# Patient Record
Sex: Male | Born: 1950 | Race: Black or African American | Hispanic: No | Marital: Married | Smoking: Current every day smoker
Health system: Southern US, Community
[De-identification: ages and names within clinical notes are randomized; demographics above are authoritative.]

## PROBLEM LIST (undated history)

## (undated) DIAGNOSIS — N402 Nodular prostate without lower urinary tract symptoms: Secondary | ICD-10-CM

## (undated) DIAGNOSIS — K635 Polyp of colon: Secondary | ICD-10-CM

## (undated) DIAGNOSIS — M1990 Unspecified osteoarthritis, unspecified site: Secondary | ICD-10-CM

## (undated) DIAGNOSIS — I10 Essential (primary) hypertension: Secondary | ICD-10-CM

## (undated) DIAGNOSIS — C801 Malignant (primary) neoplasm, unspecified: Secondary | ICD-10-CM

## (undated) DIAGNOSIS — Z8619 Personal history of other infectious and parasitic diseases: Secondary | ICD-10-CM

## (undated) DIAGNOSIS — G5600 Carpal tunnel syndrome, unspecified upper limb: Secondary | ICD-10-CM

## (undated) HISTORY — DX: Personal history of other infectious and parasitic diseases: Z86.19

## (undated) HISTORY — DX: Nodular prostate without lower urinary tract symptoms: N40.2

## (undated) HISTORY — DX: Carpal tunnel syndrome, unspecified upper limb: G56.00

## (undated) HISTORY — DX: Unspecified osteoarthritis, unspecified site: M19.90

## (undated) HISTORY — DX: Polyp of colon: K63.5

---

## 1999-11-30 HISTORY — PX: TOTAL HIP ARTHROPLASTY: SHX124

## 2000-10-31 HISTORY — PX: JOINT REPLACEMENT: SHX530

## 2004-11-29 DIAGNOSIS — K635 Polyp of colon: Secondary | ICD-10-CM

## 2004-11-29 HISTORY — DX: Polyp of colon: K63.5

## 2005-02-22 ENCOUNTER — Ambulatory Visit: Payer: Self-pay | Admitting: Unknown Physician Specialty

## 2005-02-22 LAB — HM COLONOSCOPY

## 2008-02-17 ENCOUNTER — Ambulatory Visit: Payer: Self-pay | Admitting: Neurology

## 2011-09-23 HISTORY — PX: PROSTATE SURGERY: SHX751

## 2012-09-29 ENCOUNTER — Ambulatory Visit: Payer: Self-pay | Admitting: Specialist

## 2013-07-12 ENCOUNTER — Encounter (HOSPITAL_COMMUNITY): Payer: Self-pay | Admitting: Pharmacy Technician

## 2013-07-13 ENCOUNTER — Other Ambulatory Visit (HOSPITAL_COMMUNITY): Payer: Self-pay | Admitting: Orthopedic Surgery

## 2013-07-13 NOTE — Patient Instructions (Addendum)
20 Francisco Charles  07/13/2013   Your procedure is scheduled on: 07-23-2013  Report to [facility] at 530 AM.  Call this number if you have problems the morning of surgery [phone]   Remember:   Do not eat food or drink liquids :After Midnight.                 SEE [facility] PREPARING FOR SURGERY SHEET   Do not wear jewelry, make-up or nail polish.  Do not wear lotions, powders, or perfumes. You may wear deodorant.   Men may shave face and neck.  Do not bring valuables to the hospital. [facility] IS NOT RESPONSIBLE FOR VALUEABLES.  Contacts, dentures or bridgework may not be worn into surgery.  Leave suitcase in the car. After surgery it may be brought to your room.  For patients admitted to the hospital, checkout time is 11:00 AM the day of discharge.   Patients discharged the day of surgery will not be allowed to drive home.  Name and phone number of your driver:  Special Instructions: N/A   Please read over the following fact sheets that you were given: MRSA information, blood fact sheet, incentive spirometer fact sheet  Call [name] RN pre op nurse if needed [phone]    FAILURE TO FOLLOW THESE INSTRUCTIONS MAY RESULT IN THE CANCELLATION OF YOUR SURGERY.  PATIENT SIGNATURE___________________________________________  NURSE SIGNATURE_____________________________________________

## 2013-07-17 ENCOUNTER — Ambulatory Visit (HOSPITAL_COMMUNITY)
Admission: RE | Admit: 2013-07-17 | Discharge: 2013-07-17 | Disposition: A | Discharge status: Home / Self Care | Payer: No Typology Code available for payment source | Source: Ambulatory Visit | Attending: Orthopedic Surgery | Admitting: Orthopedic Surgery

## 2013-07-17 ENCOUNTER — Encounter (HOSPITAL_COMMUNITY): Payer: Self-pay

## 2013-07-17 ENCOUNTER — Encounter (HOSPITAL_COMMUNITY)
Admission: RE | Admit: 2013-07-17 | Discharge: 2013-07-17 | Disposition: A | Discharge status: Home / Self Care | Payer: No Typology Code available for payment source | Source: Ambulatory Visit | Attending: Orthopedic Surgery | Admitting: Orthopedic Surgery

## 2013-07-17 DIAGNOSIS — Z01818 Encounter for other preprocedural examination: Secondary | ICD-10-CM | POA: Insufficient documentation

## 2013-07-17 DIAGNOSIS — I10 Essential (primary) hypertension: Secondary | ICD-10-CM | POA: Insufficient documentation

## 2013-07-17 DIAGNOSIS — M1610 Unilateral primary osteoarthritis, unspecified hip: Secondary | ICD-10-CM | POA: Insufficient documentation

## 2013-07-17 DIAGNOSIS — Z01812 Encounter for preprocedural laboratory examination: Secondary | ICD-10-CM | POA: Insufficient documentation

## 2013-07-17 DIAGNOSIS — M169 Osteoarthritis of hip, unspecified: Secondary | ICD-10-CM | POA: Insufficient documentation

## 2013-07-17 HISTORY — DX: Essential (primary) hypertension: I10

## 2013-07-17 LAB — CBC
HCT: 44.8 % (ref 39.0–52.0)
MCH: 31.7 pg (ref 26.0–34.0)
MCV: 89.8 fL (ref 78.0–100.0)
Platelets: 290 10*3/uL (ref 150–400)
RDW: 14.1 % (ref 11.5–15.5)

## 2013-07-17 LAB — BASIC METABOLIC PANEL
BUN: 12 mg/dL (ref 6–23)
CO2: 29 mEq/L (ref 19–32)
Calcium: 9.7 mg/dL (ref 8.4–10.5)
Chloride: 95 mEq/L — ABNORMAL LOW (ref 96–112)
Creatinine, Ser: 0.92 mg/dL (ref 0.50–1.35)
Glucose, Bld: 88 mg/dL (ref 70–99)

## 2013-07-17 LAB — URINE MICROSCOPIC-ADD ON

## 2013-07-17 LAB — URINALYSIS, ROUTINE W REFLEX MICROSCOPIC
Glucose, UA: NEGATIVE mg/dL
Ketones, ur: NEGATIVE mg/dL
Leukocytes, UA: NEGATIVE
Protein, ur: NEGATIVE mg/dL
pH: 6 (ref 5.0–8.0)

## 2013-07-17 LAB — SURGICAL PCR SCREEN: MRSA, PCR: NEGATIVE

## 2013-07-17 NOTE — Progress Notes (Signed)
06-11-13 medical clearance note Dr. Sherrie Mustache & EKG on chart

## 2013-07-19 NOTE — H&P (Signed)
TOTAL HIP REVISION ADMISSION H&P  Patient is admitted for left revision total hip arthroplasty, acetabular component.  Subjective:  Chief Complaint: Left hip pain s/p left total hip arthroplasty  HPI: Francisco Charles, 62 y.o. male, has a history of pain and functional disability in the left hip due to pain and patient has failed non-surgical conservative treatments for greater than 12 weeks to include NSAID's and/or analgesics and activity modification. The indications for the revision total hip arthroplasty are loosening of one or more components.  Onset of symptoms was gradual starting 2 years ago with gradually worsening course since that time.  Prior procedures on the left hip include arthroplasty.  Patient currently rates pain in the left hip at 7 out of 10 with activity.  There is worsening of pain with activity and weight bearing, trendelenberg gait and pain that interfers with activities of daily living. Patient has evidence of prosthetic loosening by imaging studies.  This condition presents safety issues increasing the risk of falls.   There is no current active signs of infection.  Risks, benefits and expectations were discussed with the patient. Patient understand the risks, benefits and expectations and wishes to proceed with surgery.   D/C Plans:   Home with HHPT  Post-op Meds:     Rx given for ASA, Robaxin, Iron, Colace and MiraLax  Tranexamic Acid:   To be given  Decadron:    To be given  FYI:    ASA post-op    Past Medical History  Diagnosis Date  . Hypertension     Past Surgical History  Procedure Laterality Date  . Joint replacement Left 10-31-2000    No Known Allergies   History  Substance Use Topics  . Smoking status: Current Every Day Smoker -- 0.50 packs/day for 30 years    Types: Cigarettes  . Smokeless tobacco: Never Used  . Alcohol Use: 1.2 oz/week    2 Cans of beer per week      Review of Systems  Constitutional: Negative.   HENT: Negative.    Eyes: Negative.   Respiratory: Negative.   Cardiovascular: Negative.   Gastrointestinal: Negative.   Genitourinary: Negative.   Musculoskeletal: Positive for joint pain.  Skin: Negative.   Neurological: Negative.   Endo/Heme/Allergies: Negative.   Psychiatric/Behavioral: Negative.     Objective:  Physical Exam  Constitutional: He is oriented to person, place, and time. He appears well-developed and well-nourished.  HENT:  Head: Normocephalic and atraumatic.  Mouth/Throat: Oropharynx is clear and moist. He has dentures.  Eyes: Pupils are equal, round, and reactive to light.  Neck: Neck supple. No JVD present. No tracheal deviation present. No thyromegaly present.  Cardiovascular: Normal rate, regular rhythm, normal heart sounds and intact distal pulses.   Respiratory: Effort normal and breath sounds normal. No stridor. No respiratory distress. He has no wheezes.  GI: Soft. There is no tenderness. There is no guarding.  Musculoskeletal:       Left hip: He exhibits decreased range of motion, decreased strength, tenderness and bony tenderness. He exhibits no swelling, no deformity and no laceration.  Lymphadenopathy:    He has no cervical adenopathy.  Neurological: He is alert and oriented to person, place, and time.  Skin: Skin is warm and dry.  Psychiatric: He has a normal mood and affect.     Imaging Review:  Plain radiographs demonstrate previous left total hip arthroplasty of the left hip. There is evidence of loosening of the acetabular cup.The bone quality appears to  be good for age and reported activity level.  Assessment/Plan:  End stage arthritis, left hip(s) with failed previous arthroplasty.  The patient history, physical examination, clinical judgement of the provider and imaging studies are consistent with end stage degenerative joint disease of the left hip(s), previous total hip arthroplasty. Revision total hip arthroplasty is deemed medically necessary. The  treatment options including medical management, injection therapy, arthroscopy and arthroplasty were discussed at length. The risks and benefits of total hip arthroplasty were presented and reviewed. The risks due to aseptic loosening, infection, stiffness, dislocation/subluxation,  thromboembolic complications and other imponderables were discussed.  The patient acknowledged the explanation, agreed to proceed with the plan and consent was signed. Patient is being admitted for inpatient treatment for surgery, pain control, PT, OT, prophylactic antibiotics, VTE prophylaxis, progressive ambulation and ADL's and discharge planning. The patient is planning to be discharged home with home health services.    Anastasio Auerbach Ranson Belluomini   PAC  07/19/2013, 12:28 PM

## 2013-07-23 ENCOUNTER — Inpatient Hospital Stay (HOSPITAL_COMMUNITY): Payer: No Typology Code available for payment source

## 2013-07-23 ENCOUNTER — Encounter (HOSPITAL_COMMUNITY): Payer: Self-pay | Admitting: *Deleted

## 2013-07-23 ENCOUNTER — Inpatient Hospital Stay (HOSPITAL_COMMUNITY)
Admission: RE | Admit: 2013-07-23 | Discharge: 2013-07-24 | DRG: 467 | Disposition: A | Discharge status: Home Health | Payer: No Typology Code available for payment source | Source: Ambulatory Visit | Attending: Orthopedic Surgery | Admitting: Orthopedic Surgery

## 2013-07-23 ENCOUNTER — Inpatient Hospital Stay (HOSPITAL_COMMUNITY): Payer: No Typology Code available for payment source | Admitting: Anesthesiology

## 2013-07-23 ENCOUNTER — Encounter (HOSPITAL_COMMUNITY): Payer: Self-pay | Admitting: Anesthesiology

## 2013-07-23 ENCOUNTER — Encounter (HOSPITAL_COMMUNITY)
Admission: RE | Disposition: A | Discharge status: Home Health | Payer: Self-pay | Source: Ambulatory Visit | Attending: Orthopedic Surgery

## 2013-07-23 DIAGNOSIS — Z96649 Presence of unspecified artificial hip joint: Secondary | ICD-10-CM

## 2013-07-23 DIAGNOSIS — M169 Osteoarthritis of hip, unspecified: Secondary | ICD-10-CM | POA: Diagnosis present

## 2013-07-23 DIAGNOSIS — M899 Disorder of bone, unspecified: Secondary | ICD-10-CM | POA: Diagnosis present

## 2013-07-23 DIAGNOSIS — E663 Overweight: Secondary | ICD-10-CM

## 2013-07-23 DIAGNOSIS — T84039A Mechanical loosening of unspecified internal prosthetic joint, initial encounter: Principal | ICD-10-CM | POA: Diagnosis present

## 2013-07-23 DIAGNOSIS — I10 Essential (primary) hypertension: Secondary | ICD-10-CM | POA: Diagnosis present

## 2013-07-23 DIAGNOSIS — F17200 Nicotine dependence, unspecified, uncomplicated: Secondary | ICD-10-CM | POA: Diagnosis present

## 2013-07-23 DIAGNOSIS — Y831 Surgical operation with implant of artificial internal device as the cause of abnormal reaction of the patient, or of later complication, without mention of misadventure at the time of the procedure: Secondary | ICD-10-CM | POA: Diagnosis present

## 2013-07-23 DIAGNOSIS — M869 Osteomyelitis, unspecified: Secondary | ICD-10-CM | POA: Diagnosis present

## 2013-07-23 DIAGNOSIS — D62 Acute posthemorrhagic anemia: Secondary | ICD-10-CM | POA: Diagnosis not present

## 2013-07-23 DIAGNOSIS — Z6827 Body mass index (BMI) 27.0-27.9, adult: Secondary | ICD-10-CM

## 2013-07-23 DIAGNOSIS — M1610 Unilateral primary osteoarthritis, unspecified hip: Secondary | ICD-10-CM | POA: Diagnosis present

## 2013-07-23 DIAGNOSIS — D500 Iron deficiency anemia secondary to blood loss (chronic): Secondary | ICD-10-CM | POA: Diagnosis not present

## 2013-07-23 HISTORY — PX: TOTAL HIP REVISION: SHX763

## 2013-07-23 LAB — TYPE AND SCREEN
ABO/RH(D): O POS
Antibody Screen: NEGATIVE

## 2013-07-23 SURGERY — TOTAL HIP REVISION
Anesthesia: Spinal | Site: Hip | Laterality: Left | Wound class: Clean

## 2013-07-23 MED ORDER — 0.9 % SODIUM CHLORIDE (POUR BTL) OPTIME
TOPICAL | Status: DC | PRN
  Administered 2013-07-23 (×2): 1000 mL

## 2013-07-23 MED ORDER — OXYCODONE HCL 5 MG/5ML PO SOLN
5.0000 mg | Freq: Once | ORAL | Status: DC | PRN
  Filled 2013-07-23: qty 5

## 2013-07-23 MED ORDER — CELECOXIB 200 MG PO CAPS
200.0000 mg | ORAL_CAPSULE | Freq: Two times a day (BID) | ORAL | Status: DC
  Administered 2013-07-23 – 2013-07-24 (×2): 200 mg via ORAL
  Filled 2013-07-23 (×3): qty 1

## 2013-07-23 MED ORDER — HYDROCODONE-ACETAMINOPHEN 7.5-325 MG PO TABS
1.0000 | ORAL_TABLET | ORAL | Status: DC
  Administered 2013-07-23 (×2): 2 via ORAL
  Administered 2013-07-23 – 2013-07-24 (×5): 1 via ORAL
  Filled 2013-07-23 (×2): qty 2
  Filled 2013-07-23: qty 1
  Filled 2013-07-23: qty 2
  Filled 2013-07-23 (×3): qty 1

## 2013-07-23 MED ORDER — SODIUM CHLORIDE 0.9 % IV SOLN
100.0000 mL/h | INTRAVENOUS | Status: DC
  Administered 2013-07-23 – 2013-07-24 (×2): 100 mL/h via INTRAVENOUS
  Filled 2013-07-23 (×8): qty 1000

## 2013-07-23 MED ORDER — CISATRACURIUM BESYLATE (PF) 10 MG/5ML IV SOLN
INTRAVENOUS | Status: DC | PRN
  Administered 2013-07-23: 2 mg via INTRAVENOUS
  Administered 2013-07-23: 4 mg via INTRAVENOUS
  Administered 2013-07-23: 8 mg via INTRAVENOUS
  Administered 2013-07-23: 2 mg via INTRAVENOUS

## 2013-07-23 MED ORDER — CEFAZOLIN SODIUM-DEXTROSE 2-3 GM-% IV SOLR
2.0000 g | INTRAVENOUS | Status: AC
  Administered 2013-07-23: 2 g via INTRAVENOUS

## 2013-07-23 MED ORDER — ASPIRIN EC 325 MG PO TBEC
325.0000 mg | DELAYED_RELEASE_TABLET | Freq: Two times a day (BID) | ORAL | Status: DC
  Administered 2013-07-24: 325 mg via ORAL
  Filled 2013-07-23 (×3): qty 1

## 2013-07-23 MED ORDER — FENTANYL CITRATE 0.05 MG/ML IJ SOLN
INTRAMUSCULAR | Status: DC | PRN
  Administered 2013-07-23: 100 ug via INTRAVENOUS
  Administered 2013-07-23 (×3): 50 ug via INTRAVENOUS

## 2013-07-23 MED ORDER — HYDROMORPHONE HCL PF 1 MG/ML IJ SOLN
INTRAMUSCULAR | Status: DC | PRN
  Administered 2013-07-23 (×2): 0.5 mg via INTRAVENOUS
  Administered 2013-07-23: 1 mg via INTRAVENOUS

## 2013-07-23 MED ORDER — GLYCOPYRROLATE 0.2 MG/ML IJ SOLN
INTRAMUSCULAR | Status: DC | PRN
  Administered 2013-07-23: 0.6 mg via INTRAVENOUS

## 2013-07-23 MED ORDER — POLYETHYLENE GLYCOL 3350 17 G PO PACK
17.0000 g | PACK | Freq: Two times a day (BID) | ORAL | Status: DC
  Administered 2013-07-23 – 2013-07-24 (×2): 17 g via ORAL

## 2013-07-23 MED ORDER — FLEET ENEMA 7-19 GM/118ML RE ENEM: 1.0000 | ENEMA | Freq: Once | RECTAL | Status: AC | PRN

## 2013-07-23 MED ORDER — METOCLOPRAMIDE HCL 10 MG PO TABS: 5.0000 mg | ORAL_TABLET | Freq: Three times a day (TID) | ORAL | Status: DC | PRN

## 2013-07-23 MED ORDER — MEPERIDINE HCL 50 MG/ML IJ SOLN: 6.2500 mg | INTRAMUSCULAR | Status: DC | PRN

## 2013-07-23 MED ORDER — PROPOFOL 10 MG/ML IV BOLUS
INTRAVENOUS | Status: DC | PRN
  Administered 2013-07-23: 150 mg via INTRAVENOUS

## 2013-07-23 MED ORDER — ONDANSETRON HCL 4 MG/2ML IJ SOLN: 4.0000 mg | Freq: Four times a day (QID) | INTRAMUSCULAR | Status: DC | PRN

## 2013-07-23 MED ORDER — DEXAMETHASONE SODIUM PHOSPHATE 10 MG/ML IJ SOLN: 10.0000 mg | Freq: Once | INTRAMUSCULAR | Status: DC

## 2013-07-23 MED ORDER — LACTATED RINGERS IV SOLN
INTRAVENOUS | Status: DC | PRN
  Administered 2013-07-23 (×3): via INTRAVENOUS

## 2013-07-23 MED ORDER — OXYCODONE HCL 5 MG PO TABS: 5.0000 mg | ORAL_TABLET | Freq: Once | ORAL | Status: DC | PRN

## 2013-07-23 MED ORDER — DOCUSATE SODIUM 100 MG PO CAPS
100.0000 mg | ORAL_CAPSULE | Freq: Two times a day (BID) | ORAL | Status: DC
  Administered 2013-07-23 – 2013-07-24 (×2): 100 mg via ORAL

## 2013-07-23 MED ORDER — PHENOL 1.4 % MT LIQD
1.0000 | OROMUCOSAL | Status: DC | PRN
  Filled 2013-07-23: qty 177

## 2013-07-23 MED ORDER — ALUM & MAG HYDROXIDE-SIMETH 200-200-20 MG/5ML PO SUSP
30.0000 mL | ORAL | Status: DC | PRN
  Administered 2013-07-24: 30 mL via ORAL
  Filled 2013-07-23: qty 30

## 2013-07-23 MED ORDER — DIPHENHYDRAMINE HCL 25 MG PO CAPS: 25.0000 mg | ORAL_CAPSULE | Freq: Four times a day (QID) | ORAL | Status: DC | PRN

## 2013-07-23 MED ORDER — ZOLPIDEM TARTRATE 5 MG PO TABS: 5.0000 mg | ORAL_TABLET | Freq: Every evening | ORAL | Status: DC | PRN

## 2013-07-23 MED ORDER — DEXAMETHASONE SODIUM PHOSPHATE 10 MG/ML IJ SOLN
10.0000 mg | Freq: Once | INTRAMUSCULAR | Status: DC
  Filled 2013-07-23: qty 1

## 2013-07-23 MED ORDER — HYDROMORPHONE HCL PF 1 MG/ML IJ SOLN
INTRAMUSCULAR | Status: AC
  Filled 2013-07-23: qty 1

## 2013-07-23 MED ORDER — METHOCARBAMOL 500 MG PO TABS
500.0000 mg | ORAL_TABLET | Freq: Four times a day (QID) | ORAL | Status: DC | PRN
Start: 2013-07-23 — End: 2013-07-24

## 2013-07-23 MED ORDER — METHOCARBAMOL 100 MG/ML IJ SOLN
500.0000 mg | Freq: Four times a day (QID) | INTRAVENOUS | Status: DC | PRN
  Administered 2013-07-23: 500 mg via INTRAVENOUS
  Filled 2013-07-23 (×2): qty 5

## 2013-07-23 MED ORDER — BUPIVACAINE LIPOSOME 1.3 % IJ SUSP
20.0000 mL | Freq: Once | INTRAMUSCULAR | Status: DC
  Filled 2013-07-23: qty 20

## 2013-07-23 MED ORDER — MENTHOL 3 MG MT LOZG
1.0000 | LOZENGE | OROMUCOSAL | Status: DC | PRN
  Filled 2013-07-23: qty 9

## 2013-07-23 MED ORDER — CEFAZOLIN SODIUM-DEXTROSE 2-3 GM-% IV SOLR
2.0000 g | Freq: Four times a day (QID) | INTRAVENOUS | Status: AC
  Administered 2013-07-23 (×2): 2 g via INTRAVENOUS
  Filled 2013-07-23 (×2): qty 50

## 2013-07-23 MED ORDER — DEXAMETHASONE SODIUM PHOSPHATE 10 MG/ML IJ SOLN
INTRAMUSCULAR | Status: DC | PRN
  Administered 2013-07-23: 10 mg via INTRAVENOUS

## 2013-07-23 MED ORDER — ONDANSETRON HCL 4 MG/2ML IJ SOLN
INTRAMUSCULAR | Status: DC | PRN
  Administered 2013-07-23: 4 mg via INTRAVENOUS

## 2013-07-23 MED ORDER — NEOSTIGMINE METHYLSULFATE 1 MG/ML IJ SOLN
INTRAMUSCULAR | Status: DC | PRN
  Administered 2013-07-23: 4 mg via INTRAVENOUS

## 2013-07-23 MED ORDER — ONDANSETRON HCL 4 MG PO TABS: 4.0000 mg | ORAL_TABLET | Freq: Four times a day (QID) | ORAL | Status: DC | PRN

## 2013-07-23 MED ORDER — HYDROMORPHONE HCL PF 1 MG/ML IJ SOLN
0.2500 mg | INTRAMUSCULAR | Status: DC | PRN
  Administered 2013-07-23 (×3): 0.5 mg via INTRAVENOUS

## 2013-07-23 MED ORDER — FERROUS SULFATE 325 (65 FE) MG PO TABS
325.0000 mg | ORAL_TABLET | Freq: Three times a day (TID) | ORAL | Status: DC
  Administered 2013-07-23 – 2013-07-24 (×2): 325 mg via ORAL
  Filled 2013-07-23 (×5): qty 1

## 2013-07-23 MED ORDER — TRANEXAMIC ACID 100 MG/ML IV SOLN
1000.0000 mg | Freq: Once | INTRAVENOUS | Status: AC
  Administered 2013-07-23: 1000 mg via INTRAVENOUS
  Filled 2013-07-23: qty 10

## 2013-07-23 MED ORDER — METOCLOPRAMIDE HCL 5 MG/ML IJ SOLN: 5.0000 mg | Freq: Three times a day (TID) | INTRAMUSCULAR | Status: DC | PRN

## 2013-07-23 MED ORDER — MIDAZOLAM HCL 5 MG/5ML IJ SOLN
INTRAMUSCULAR | Status: DC | PRN
  Administered 2013-07-23: 2 mg via INTRAVENOUS

## 2013-07-23 MED ORDER — PROMETHAZINE HCL 25 MG/ML IJ SOLN: 6.2500 mg | INTRAMUSCULAR | Status: DC | PRN

## 2013-07-23 MED ORDER — HYDROMORPHONE HCL PF 1 MG/ML IJ SOLN
0.5000 mg | INTRAMUSCULAR | Status: DC | PRN
Start: 2013-07-23 — End: 2013-07-24

## 2013-07-23 MED ORDER — BISACODYL 10 MG RE SUPP: 10.0000 mg | Freq: Every day | RECTAL | Status: DC | PRN

## 2013-07-23 SURGICAL SUPPLY — 60 items
BAG ZIPLOCK 12X15 (MISCELLANEOUS) IMPLANT
BLADE SAW SGTL 18X1.27X75 (BLADE) IMPLANT
BRUSH FEMORAL CANAL (MISCELLANEOUS) IMPLANT
CLOTH BEACON ORANGE TIMEOUT ST (SAFETY) ×2 IMPLANT
CUP ACETAB 60MM (Orthopedic Implant) ×2 IMPLANT
DERMABOND ADVANCED (GAUZE/BANDAGES/DRESSINGS) ×1
DERMABOND ADVANCED .7 DNX12 (GAUZE/BANDAGES/DRESSINGS) ×1 IMPLANT
DRAPE INCISE IOBAN 85X60 (DRAPES) ×2 IMPLANT
DRAPE ORTHO SPLIT 77X108 STRL (DRAPES) ×2
DRAPE POUCH INSTRU U-SHP 10X18 (DRAPES) ×2 IMPLANT
DRAPE SURG 17X11 SM STRL (DRAPES) ×2 IMPLANT
DRAPE SURG ORHT 6 SPLT 77X108 (DRAPES) ×2 IMPLANT
DRAPE U-SHAPE 47X51 STRL (DRAPES) ×2 IMPLANT
DRSG AQUACEL AG ADV 3.5X10 (GAUZE/BANDAGES/DRESSINGS) IMPLANT
DRSG AQUACEL AG ADV 3.5X14 (GAUZE/BANDAGES/DRESSINGS) ×2 IMPLANT
DRSG TEGADERM 4X4.75 (GAUZE/BANDAGES/DRESSINGS) IMPLANT
DURAPREP 26ML APPLICATOR (WOUND CARE) ×2 IMPLANT
ELECT BLADE TIP CTD 4 INCH (ELECTRODE) ×2 IMPLANT
ELECT REM PT RETURN 9FT ADLT (ELECTROSURGICAL) ×2
ELECTRODE REM PT RTRN 9FT ADLT (ELECTROSURGICAL) ×1 IMPLANT
EVACUATOR 1/8 PVC DRAIN (DRAIN) IMPLANT
FACESHIELD LNG OPTICON STERILE (SAFETY) ×8 IMPLANT
GAUZE SPONGE 2X2 8PLY STRL LF (GAUZE/BANDAGES/DRESSINGS) IMPLANT
GLOVE BIOGEL PI IND STRL 7.5 (GLOVE) ×1 IMPLANT
GLOVE BIOGEL PI IND STRL 8 (GLOVE) ×1 IMPLANT
GLOVE BIOGEL PI INDICATOR 7.5 (GLOVE) ×1
GLOVE BIOGEL PI INDICATOR 8 (GLOVE) ×1
GLOVE ECLIPSE 8.0 STRL XLNG CF (GLOVE) ×2 IMPLANT
GLOVE ORTHO TXT STRL SZ7.5 (GLOVE) ×4 IMPLANT
GOWN BRE IMP PREV XXLGXLNG (GOWN DISPOSABLE) ×6 IMPLANT
GOWN STRL NON-REIN LRG LVL3 (GOWN DISPOSABLE) ×2 IMPLANT
GRAFT IC CHAMBER LG (Bone Implant) ×2 IMPLANT
GRAFT IC CHAMBER MED (Bone Implant) ×2 IMPLANT
HANDPIECE INTERPULSE COAX TIP (DISPOSABLE)
HEAD CERAMIC BIO DELTA 36 (Hips) ×2 IMPLANT
KIT BASIN OR (CUSTOM PROCEDURE TRAY) ×2 IMPLANT
LINER NEUTRAL 60X36X58 P4 HIP (Liner) ×2 IMPLANT
MANIFOLD NEPTUNE II (INSTRUMENTS) ×2 IMPLANT
NS IRRIG 1000ML POUR BTL (IV SOLUTION) ×2 IMPLANT
PACK TOTAL JOINT (CUSTOM PROCEDURE TRAY) ×2 IMPLANT
POSITIONER SURGICAL ARM (MISCELLANEOUS) ×2 IMPLANT
PRESSURIZER FEMORAL UNIV (MISCELLANEOUS) IMPLANT
SCREW 6.5MMX25MM (Screw) ×2 IMPLANT
SCREW 6.5MMX35MM (Screw) ×2 IMPLANT
SCREW PINN CAN BONE 6.5X50MM (Screw) ×2 IMPLANT
SET HNDPC FAN SPRY TIP SCT (DISPOSABLE) IMPLANT
SPONGE GAUZE 2X2 STER 10/PKG (GAUZE/BANDAGES/DRESSINGS)
SPONGE LAP 18X18 X RAY DECT (DISPOSABLE) IMPLANT
SPONGE LAP 4X18 X RAY DECT (DISPOSABLE) IMPLANT
STAPLER VISISTAT 35W (STAPLE) IMPLANT
SUCTION FRAZIER TIP 10 FR DISP (SUCTIONS) ×2 IMPLANT
SUT MNCRL AB 4-0 PS2 18 (SUTURE) ×2 IMPLANT
SUT VIC AB 1 CT1 36 (SUTURE) ×2 IMPLANT
SUT VIC AB 2-0 CT1 27 (SUTURE) ×3
SUT VIC AB 2-0 CT1 TAPERPNT 27 (SUTURE) ×3 IMPLANT
SUT VLOC 180 0 24IN GS25 (SUTURE) ×2 IMPLANT
TOWEL OR 17X26 10 PK STRL BLUE (TOWEL DISPOSABLE) ×4 IMPLANT
TOWER CARTRIDGE SMART MIX (DISPOSABLE) IMPLANT
TRAY FOLEY METER SIL LF 16FR (CATHETERS) ×2 IMPLANT
WATER STERILE IRR 1500ML POUR (IV SOLUTION) ×4 IMPLANT

## 2013-07-23 NOTE — Brief Op Note (Signed)
07/23/2013  10:15 AM  PATIENT:  Francisco Charles  62 y.o. male  PRE-OPERATIVE DIAGNOSIS:  FAILED LEFT TOTAL HIP ARTHROPLASTY  POST-OPERATIVE DIAGNOSIS:  FAILED LEFT TOTAL HIP ARTHROPLASTY  PROCEDURE:  Procedure(s): LEFT TOTAL HIP REVISION ARTHROPLASTY WITH BONE GRAFT (Left)  SURGEON:  Surgeon(s) and Role:    * [name], MD - Primary  PHYSICIAN ASSISTANT: Lanney Gins, PA-C  ANESTHESIA:   general  EBL:  Total I/O In: 2000 [I.V.:2000] Out: 1080 [Urine:330; Blood:750]  BLOOD ADMINISTERED:none  DRAINS: none   LOCAL MEDICATIONS USED:  NONE  SPECIMEN:  No Specimen  DISPOSITION OF SPECIMEN:  N/A  COUNTS:  YES  TOURNIQUET:  * No tourniquets in log *  DICTATION: .Other Dictation: Dictation Number [id number]  PLAN OF CARE: Admit to inpatient   PATIENT DISPOSITION:  PACU - hemodynamically stable.   Delay start of Pharmacological VTE agent (>24hrs) due to surgical blood loss or risk of bleeding: no

## 2013-07-23 NOTE — Preoperative (Signed)
Beta Blockers   Reason not to administer Beta Blockers:Not Applicable 

## 2013-07-23 NOTE — Anesthesia Postprocedure Evaluation (Signed)
Anesthesia Post Note  Patient: Francisco Charles  Procedure(s) Performed: Procedure(s) (LRB): LEFT TOTAL HIP REVISION ARTHROPLASTY WITH BONE GRAFT (Left)  Anesthesia type: General  Patient location: PACU  Post pain: Pain level controlled  Post assessment: Post-op Vital signs reviewed  Last Vitals: BP 134/81  Pulse 64  Temp(Src) 36.7 C (Oral)  Resp 16  Ht 5\' 7"  (1.702 m)  Wt 177 lb (80.287 kg)  BMI 27.72 kg/m2  SpO2 97%  Post vital signs: Reviewed  Level of consciousness: sedated  Complications: No apparent anesthesia complications

## 2013-07-23 NOTE — Anesthesia Preprocedure Evaluation (Signed)
Anesthesia Evaluation  Patient identified by MRN, date of birth, ID band Patient awake    Reviewed: Allergy & Precautions, H&P , NPO status , Patient's Chart, lab work & pertinent test results  Airway       Dental  (+) Dental Advisory Given   Pulmonary neg pulmonary ROS,          Cardiovascular hypertension, Pt. on medications     Neuro/Psych negative neurological ROS  negative psych ROS   GI/Hepatic negative GI ROS, Neg liver ROS,   Endo/Other  negative endocrine ROS  Renal/GU negative Renal ROS     Musculoskeletal negative musculoskeletal ROS (+)   Abdominal   Peds  Hematology negative hematology ROS (+)   Anesthesia Other Findings   Reproductive/Obstetrics                           Anesthesia Physical Anesthesia Plan  ASA: II  Anesthesia Plan: Spinal   Post-op Pain Management:    Induction:   Airway Management Planned:   Additional Equipment:   Intra-op Plan:   Post-operative Plan:   Informed Consent: I have reviewed the patients History and Physical, chart, labs and discussed the procedure including the risks, benefits and alternatives for the proposed anesthesia with the patient or authorized representative who has indicated his/her understanding and acceptance.   Dental advisory given  Plan Discussed with: CRNA  Anesthesia Plan Comments:         Anesthesia Quick Evaluation

## 2013-07-23 NOTE — Evaluation (Signed)
Physical Therapy Evaluation Patient Details Name: Francisco Charles MRN: [id number] DOB: 1951-07-29 Today's Date: 07/23/2013 Time: 6213-0865 PT Time Calculation (min): 17 min  PT Assessment / Plan / Recommendation History of Present Illness  S/P revision posterior THA with bone graft.  Clinical Impression  Pt tolerated ambulating in hall today. Min c/o pain. Pt plans to DC to home at DC.    PT Assessment  Patient needs continued PT services    Follow Up Recommendations  Home health PT    Does the patient have the potential to tolerate intense rehabilitation      Barriers to Discharge        Equipment Recommendations  None recommended by PT    Recommendations for Other Services     Frequency 7X/week    Precautions / Restrictions Precautions Precautions: Posterior Hip, reviewed and posted precautions; Restrictions LLE Weight Bearing: Partial weight bearing LLE Partial Weight Bearing Percentage or Pounds: 25%   Pertinent Vitals/Pain Stated hip is only soreness.       Mobility  Bed Mobility Bed Mobility: Supine to Sit;Sitting - Scoot to Edge of Bed Supine to Sit: 4: Min assist Sitting - Scoot to [place] of Bed: 4: Min guard Transfers Transfers: Sit to Stand;Stand to Sit Sit to Stand: 4: Min assist;From bed;With upper extremity assist;From elevated surface Stand to Sit: To chair/3-in-1;With upper extremity assist;With armrests Details for Transfer Assistance: cues for posterior precautions and weightbear. Ambulation/Gait Ambulation/Gait Assistance: 4: Min assist Ambulation Distance (Feet): 200 Feet Assistive device: Rolling walker Ambulation/Gait Assistance Details: sequence, position inside RW., WBS Gait Pattern: Step-to pattern;Decreased step length - left    Exercises     PT Diagnosis: Difficulty walking  PT Problem List: Decreased strength;Decreased activity tolerance;Decreased range of motion;Decreased mobility;Decreased safety awareness;Decreased knowledge of  use of DME PT Treatment Interventions: DME instruction;Gait training;Stair training;Functional mobility training;Therapeutic activities;Therapeutic exercise;Patient/family education     PT Goals(Current goals can be found in the care plan section) Acute Rehab PT Goals Patient Stated Goal: I am ready to get up, walk without pain. PT Goal Formulation: With patient/family Time For Goal Achievement: 08/06/13 Potential to Achieve Goals: Good  Visit Information  Last PT Received On: 07/23/13 Assistance Needed: +1 History of Present Illness: S/P revision posterior THA with bone graft.       Prior Functioning  Home Living Family/patient expects to be discharged to:: Private residence Living Arrangements: Spouse/significant other Available Help at Discharge: Family Type of Home: House Home Access: Stairs to enter Secretary/administrator of Steps: 2 Entrance Stairs-Rails: Right Home Equipment: Environmental consultant - 2 wheels;Cane - single point;Crutches Prior Function Level of Independence: Independent Communication Communication: No difficulties    Cognition  Cognition Arousal/Alertness: Awake/alert Behavior During Therapy: WFL for tasks assessed/performed Overall Cognitive Status: Within Functional Limits for tasks assessed    Extremity/Trunk Assessment Upper Extremity Assessment Upper Extremity Assessment: Overall WFL for tasks assessed Lower Extremity Assessment Lower Extremity Assessment: LLE deficits/detail LLE Deficits / Details: assist to move LLE over to edge of bed , min assist.   Balance    End of Session PT - End of Session Activity Tolerance: Patient tolerated treatment well Patient left: in chair;with call bell/phone within reach;with family/visitor present Nurse Communication: Mobility status  GP     Rada Hay 07/23/2013, 3:59 PM  Blanchard Kelch PT [phone]

## 2013-07-23 NOTE — Progress Notes (Signed)
Utilization review completed.  

## 2013-07-23 NOTE — Interval H&P Note (Signed)
History and Physical Interval Note:  07/23/2013 7:13 AM  Francisco Charles  has presented today for surgery, with the diagnosis of FAILED LEFT TOTAL HIP ARTHROPLASTY  The various methods of treatment have been discussed with the patient and family. After consideration of risks, benefits and other options for treatment, the patient has consented to  Procedure(s): LEFT TOTAL HIP ARTHROPLASTY REVISION (Left) as a surgical intervention .  The patient's history has been reviewed, patient examined, no change in status, stable for surgery.  I have reviewed the patient's chart and labs.  Questions were answered to the patient's satisfaction.     [name]

## 2013-07-23 NOTE — Transfer of Care (Signed)
Immediate Anesthesia Transfer of Care Note  Patient: Francisco Charles  Procedure(s) Performed: Procedure(s): LEFT TOTAL HIP REVISION ARTHROPLASTY WITH BONE GRAFT (Left)  Patient Location: PACU  Anesthesia Type:General  Level of Consciousness: awake, sedated and patient cooperative  Airway & Oxygen Therapy: Patient Spontanous Breathing and Patient connected to face mask oxygen  Post-op Assessment: Report given to PACU RN and Post -op Vital signs reviewed and stable  Post vital signs: Reviewed and stable  Complications: No apparent anesthesia complications

## 2013-07-24 DIAGNOSIS — E663 Overweight: Secondary | ICD-10-CM

## 2013-07-24 DIAGNOSIS — D500 Iron deficiency anemia secondary to blood loss (chronic): Secondary | ICD-10-CM | POA: Diagnosis not present

## 2013-07-24 LAB — BASIC METABOLIC PANEL
BUN: 9 mg/dL (ref 6–23)
CO2: 30 mEq/L (ref 19–32)
Calcium: 8.8 mg/dL (ref 8.4–10.5)
Glucose, Bld: 98 mg/dL (ref 70–99)
Sodium: 136 mEq/L (ref 135–145)

## 2013-07-24 LAB — CBC
HCT: 35.1 % — ABNORMAL LOW (ref 39.0–52.0)
Hemoglobin: 11.8 g/dL — ABNORMAL LOW (ref 13.0–17.0)
MCH: 30.7 pg (ref 26.0–34.0)
RBC: 3.84 MIL/uL — ABNORMAL LOW (ref 4.22–5.81)

## 2013-07-24 MED ORDER — DSS 100 MG PO CAPS: 100.0000 mg | ORAL_CAPSULE | Freq: Two times a day (BID) | ORAL | Status: DC

## 2013-07-24 MED ORDER — ASPIRIN 325 MG PO TBEC: 325.0000 mg | DELAYED_RELEASE_TABLET | Freq: Two times a day (BID) | ORAL | Status: AC

## 2013-07-24 MED ORDER — HYDROCODONE-ACETAMINOPHEN 7.5-325 MG PO TABS: 1.0000 | ORAL_TABLET | ORAL | Status: DC | PRN

## 2013-07-24 MED ORDER — FERROUS SULFATE 325 (65 FE) MG PO TABS: 325.0000 mg | ORAL_TABLET | Freq: Three times a day (TID) | ORAL | Status: DC

## 2013-07-24 MED ORDER — POLYETHYLENE GLYCOL 3350 17 G PO PACK: 17.0000 g | PACK | Freq: Two times a day (BID) | ORAL | Status: DC

## 2013-07-24 MED ORDER — METHOCARBAMOL 500 MG PO TABS: 500.0000 mg | ORAL_TABLET | Freq: Four times a day (QID) | ORAL | Status: DC | PRN

## 2013-07-24 NOTE — Op Note (Signed)
NAMEJAMAN, ARO              ACCOUNT NO.:  [id number]  MEDICAL RECORD NO.:  [id number]  LOCATION:  [facility]                         FACILITY:  [facility]  PHYSICIAN:  Madlyn Frankel. Charlann Boxer, M.D.  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  FINDINGS:  The patient is noted to have severe osteolytic damage to his left hemipelvis with significant lytic lesions involving the pubis, ischium, and ilium with an eroded through medial wall.  PROCEDURE:  Revision of left total hip replacement.  COMPONENTS USED:  A size 60 grips and Revision DePuy shell.  A 36+ 4 10- degree AltrX liner; and a 36+ 12 delta ceramic ball impacted onto retained femoral component.  ASSISTANT:  Lanney Gins, PA.  Note that Mr. Carmon Sails was present through the entirety of the case for preoperative position, perioperative management of upper extremity, general facilitation of the case, primary wound closure.  ANESTHESIA:  General.  SPECIMENS:  None.  COMPLICATIONS:  None apparent.  DRAINS:  None.  BLOOD LOSS:  About 750 mL.  INDICATION FOR PROCEDURE:  Mr. Rowland is a 62 year old gentleman who was referred for evaluation of his left hip related osteolytic changes on his radiographs at 62 years of age, and I discussed with him the concerns about his current hip, which was an old Duraloc cup with old polyethylene.  There was significant osteolytic changes on his acetabulum.  The concern was that if this were to fail catastrophically, then his pelvis may not be reconstructible.  I was thus discussed that surgically revising him to a newer technology with a ceramic ball would perhaps decreased the  osteolytic reaction around the pelvis and allow for sustained implant long term.  After reviewing these indications, the risks of infection, DVT, component failure, including dislocation and revision setting as well as the postop course and expectations.  Consent was obtained for benefit of management of this problem.  PROCEDURE IN DETAIL:  The patient was brought to operative theater. Once adequate anesthesia, preoperative antibiotics, Ancef administered. He was positioned into the right lateral decubitus position with left side up.  The left lower extremity was then prepped and draped in a sterile fashion.  Time-out was performed identifying the patient, planned procedure, and extremity.  The patient's old incision was identified to be an old Southern-type incision.  It was utilized and the skin excised.  Soft tissue planes were created.  The iliotibial band and gluteal fascia was then incised for posterior approach.  Soft tissue dissection was carefully taken out exposing the posterior aspect of the hip.  Upon entering the capsule, we encountered clear synovial fluid and significant osteolytic debris present throughout the posterior aspect of the hip.  Once the hip was exposed and I was able to debride the posterior two-thirds of the osteolytic debris and capsular tissue, the hip was dislocated.  The femoral head was removed.  I had elevated the soft tissues off  the ilium and placed retractor and subsequently was able to place the trunnion of the femoral component onto this area. With retractors placed, I then uncovered the rim of the cup.  We opened up the Innomed explant system and found that we had a 56 cup in place before with a 28 head.  The starting blade was then inserted, and I was able to get around basically the posterior two-thirds of the cup.  This was an old cup with 3 spikes in place.  Upon removal of the cup, however, I found there was a significant portion of the  patient's anterior wall retained onto the cup.  Thus finding once the cup was removed, the patient's medial wall was basically absent, extremely thin, significant osteolytic lesion into the pubis, into the ischium as well as in the ilium.  The patient's pelvis was despite this intact with a retained posterior column.  There was no evidence of any discontinuity of the pelvis.  Following debridement of the left hemipelvis at this point, removing osteolytic debris through irrigation as well as sharp removal with a curette.  I began reaming carefully the patient's bone.  I started with a 54 reamer.  Based on the patient's bone quality was remaining, decided the best point of fixation were going to be that the remaining rim of the acetabulum.  I thus gently reamed up removing very little bone to 58 mm reamer using trial components and found that the best point fixation of the rim inferiorly posterior and superior with this.  I thus selected a 16 mm Gription cup, not a deep dish, I did not want to blow through the remaining bone to get a deep dish to fit.  This final cup was selected and placed 25 mL of cancellous bone graft into the acetabular defects and reverse reaming with 58 reamer.  The final cup was then impacted, it was positioned at approximately 15-20 degrees of anteversion with about 40 degrees of abduction.  Had initially good scratch fit.  I was able to place 3 screws into the ilium and unable to get any screws into the inferior aspect of the hip, but the 3 screws in the ilium with excellent purchase.  I did do a trial with a 36 neutral liner.  With the trial in place, a 36+ 5 ball, there was a sense of instability combined anteversion was only about 30 degrees.  Given these findings, I dislocated the hip, further debrided the anterior soft tissues off of the trochanter knowing that the anteromedial structures of the pelvis were just anterior and did not want to further debride  this area.  The final 36+ 4, 10-degree face changing liner was then impacted, placing the 10-degree portion at approximately 3 o'clock for this left hip.  I selected a 36+ 12 ball as a trial and then subsequent final implant to provide stability on the soft tissues.  Combined anteversion noted be better with a 40-45 degrees with this without evidence of impingement and leg lengths appeared to be equal, there was minimal shuck in extension.  The final 36+ 12 delta ceramic ball was impacted onto the clean and dry trunnion and the hip was reduced.  The hip was then irrigated.  There was no significant hemostasis required.  At this point, I reapproximated the posterior soft tissues to the superior tissues using #1 Vicryl and then using a combination of #1 Vicryl and 0 V-Loc sutures, the iliotibial band and gluteal fascia were then reapproximated.  The remainder of the wound was closed with 2-0 Vicryl and a running 4-0 Monocryl.  The hip was cleaned, dried, dressed sterilely using Dermabond and Aquacel dressing.  He was then brought to the recovery room in stable condition, tolerating the procedure well.  He will be touchdown weightbearing at 25%-50% for the next 6-8 weeks while we make certain that we get acetabular ingrowth into this acetabular shell.  Given the remaining bone stock was left in the pelvis, the possibilities for reconstruction of pelvis will be limited to custom-made triflange component if this were to fail.     Madlyn Frankel Charlann Boxer, M.D.     MDO/MEDQ  D:  07/23/2013  T:  07/23/2013  Job:  [id number]

## 2013-07-24 NOTE — Progress Notes (Signed)
   Subjective: 1 Day Post-Op Procedure(s) (LRB): LEFT TOTAL HIP REVISION ARTHROPLASTY WITH BONE GRAFT (Left)   Patient reports pain as mild, pain well controlled. No events throughout the night. Ready to be discharged home if he does well with PT and pain stays controlled.   Objective:   VITALS:   Filed Vitals:   07/24/13 0422  BP: 164/83  Pulse: 62  Temp: 98.1 F (36.7 C)  Resp: 18    Neurovascular intact Dorsiflexion/Plantar flexion intact Incision: dressing C/D/I No cellulitis present Compartment soft  LABS  Recent Labs  07/24/13 0407  HGB 11.8*  HCT 35.1*  WBC 9.1  PLT 228     Recent Labs  07/24/13 0407  NA 136  K 3.6  BUN 9  CREATININE 0.93  GLUCOSE 98     Assessment/Plan: 1 Day Post-Op Procedure(s) (LRB): LEFT TOTAL HIP REVISION ARTHROPLASTY WITH BONE GRAFT (Left) HV drain d/c'ed Foley cath d/c'ed Advance diet Up with therapy D/C IV fluids Discharge home with home health Follow up in 2 weeks at [facility] Follow up with OLIN,Keila Turan D in 2 weeks.  Contact information:  [facility] [address] [place] [address] [phone]    Expected ABLA  Treated with iron and will observe  Overweight (BMI 25-29.9) Estimated body mass index is 27.72 kg/(m^2) as calculated from the following:   Height as of this encounter: 5\' 7"  (1.702 m).   Weight as of this encounter: 80.287 kg (177 lb). Patient also counseled that weight may inhibit the healing process Patient counseled that losing weight will help with future health issues       Anastasio Auerbach. Delle Andrzejewski   PAC  07/24/2013, 7:50 AM

## 2013-07-24 NOTE — Progress Notes (Signed)
Physical Therapy Treatment Patient Details Name: ROMMEL HOGSTON MRN: [id number] DOB: [birth year] Today's Date: 07/24/2013 Time: 1191-4782 PT Time Calculation (min): 25 min  PT Assessment / Plan / Recommendation  History of Present Illness S/P revision posterior THA with bone graft.   PT Comments   Pt doing very well.  Aware of 3/3 THR and PWB.  Amb pt in hallway, practiced negotiating 2 steps with B rails and given HEP.  Instructed on use of ICE.  Pt plans to D/C to home today.   Follow Up Recommendations  Home health PT     Does the patient have the potential to tolerate intense rehabilitation     Barriers to Discharge        Equipment Recommendations  None recommended by PT    Recommendations for Other Services    Frequency 7X/week   Progress towards PT Goals Progress towards PT goals: Progressing toward goals  Plan      Precautions / Restrictions Precautions Precautions: Posterior Hip Restrictions Weight Bearing Restrictions: Yes LLE Weight Bearing: Partial weight bearing LLE Partial Weight Bearing Percentage or Pounds: 25% Other Position/Activity Restrictions: Bone Graft    Pertinent Vitals/Pain C/o "soreness" ICE applied    Mobility  Bed Mobility Bed Mobility: Not assessed Details for Bed Mobility Assistance: OOB in recliner Transfers Transfers: Sit to Stand;Stand to Sit Sit to Stand: 6: Modified independent (Device/Increase time) Stand to Sit: 6: Modified independent (Device/Increase time) Details for Transfer Assistance: increased time Ambulation/Gait Ambulation/Gait Assistance: 5: Supervision Ambulation Distance (Feet): 85 Feet Assistive device: Rolling walker Ambulation/Gait Assistance Details: increased time Gait Pattern: Step-to pattern;Decreased step length - left Stairs: Yes Stair Management Technique: Two rails Number of Stairs: 2    Exercises   Total Hip Replacement TE's 10 reps ankle pumps 10 reps knee presses 10 reps heel slides 10  reps SAQ's 10 reps ABD Followed by ICE    PT Goals (current goals can now be found in the care plan section) Acute Rehab PT Goals Patient Stated Goal: none stated  Visit Information  Last PT Received On: 07/24/13 Assistance Needed: +1 History of Present Illness: S/P revision posterior THA with bone graft.    Subjective Data  Patient Stated Goal: none stated   Cognition  Cognition Arousal/Alertness: Awake/alert Behavior During Therapy: WFL for tasks assessed/performed Overall Cognitive Status: Within Functional Limits for tasks assessed    Balance  Balance Balance Assessed: Yes Dynamic Standing Balance Dynamic Standing - Level of Assistance: 5: Stand by assistance  End of Session PT - End of Session Equipment Utilized During Treatment: Gait belt Activity Tolerance: Patient tolerated treatment well Patient left: in chair;with call bell/phone within reach;with family/visitor present Nurse Communication: Mobility status   Felecia Shelling  PTA [facility]  Acute  Rehab Pager      [phone]

## 2013-07-24 NOTE — Care Management Note (Signed)
    Page 1 of 2   07/24/2013     11:15:09 AM   CARE MANAGEMENT NOTE 07/24/2013  Patient:  Francisco Charles, Francisco Charles   Account Number:  [id number]  Date Initiated:  07/24/2013  Documentation initiated by:  Colleen Can  Subjective/Objective Assessment:   DX failed left total hip arthroplasty; revision total hip arthroplasty.    pcp-Dr Sherrie Mustache  Pharmacy_[facility]     Action/Plan:   CM spoke with patient & spouse. Plans are for patient to return to his home in [facility] where spouse will be caregiver. He already has RW, raised toilet seat, and adaptive equipment. Genevieve Norlander will provide [facility] services.   Anticipated DC Date:  07/24/2013   Anticipated DC Plan:  HOME W HOME HEALTH SERVICES      DC Planning Services  CM consult      [facility] Choice  HOME HEALTH   Choice offered to / List presented to:  C-1 Patient        HH arranged  HH-2 PT      [facility] agency  [facility]   Status of service:  Completed, signed off Medicare Important Message given?   (If response is "NO", the following Medicare IM given date fields will be blank) Date Medicare IM given:   Date Additional Medicare IM given:    Discharge Disposition:  HOME W HOME HEALTH SERVICES  Per UR Regulation:  Reviewed for med. necessity/level of care/duration of stay  If discussed at Long Length of Stay Meetings, dates discussed:    Comments:  07/24/2013 Colleen Can BSN RN CCM [phone] Genevieve Norlander will provide HHpt services with start date of tomorrow 07/25/2013.

## 2013-07-24 NOTE — Evaluation (Addendum)
Occupational Therapy Evaluation Patient Details Name: Francisco Charles MRN: [id number] DOB: Oct 01, 1951 Today's Date: 07/24/2013 Time: 4098-1191 OT Time Calculation (min): 27 min  OT Assessment / Plan / Recommendation History of present illness S/P revision posterior THA with bone graft.   Clinical Impression   Pt doing very well with functional mobility and ADL. He does need some cues for using AE while adhering to THPs. Wife present and verbalizes understanding of AE use and where to obtain LHS if desired versus assist pt with below knees to wash. Pt plans to sponge bathe until full weightbearing to get into his tub     OT Assessment  Patient needs continued OT Services    Follow Up Recommendations  No OT follow up;Supervision/Assistance - 24 hour    Barriers to Discharge      Equipment Recommendations  None recommended by OT    Recommendations for Other Services    Frequency  Min 2X/week    Precautions / Restrictions Precautions Precautions: Posterior Hip Restrictions Weight Bearing Restrictions: Yes LLE Weight Bearing: Partial weight bearing LLE Partial Weight Bearing Percentage or Pounds: 25%   Pertinent Vitals/Pain "just sore" L hip    ADL  Eating/Feeding: Simulated;Independent Where Assessed - Eating/Feeding: Chair Grooming: Simulated;Wash/dry hands;Set up Where Assessed - Grooming: Supported sitting Upper Body Bathing: Simulated;Right arm;Chest;Left arm;Abdomen;Set up Where Assessed - Upper Body Bathing: Unsupported sitting Lower Body Bathing: Simulated;Min guard with AE Where Assessed - Lower Body Bathing: Supported sit to stand Upper Body Dressing: Simulated;Set up Where Assessed - Upper Body Dressing: Unsupported sitting Lower Body Dressing: Performed;Minimal assistance (with AE and verbal cues for THPs) Where Assessed - Lower Body Dressing: Supported sit to stand Toilet Transfer: Performed;Min guard verbal cues for not turning L foot inward with turning  toward 3in1 and exiting bathroom. Toilet Transfer Method: Other (comment) (with walker into bathroom) Toilet Transfer Equipment: Raised toilet seat with arms (or 3-in-1 over toilet) Toileting - Clothing Manipulation and Hygiene: Simulated;Min guard Where Assessed - Glass blower/designer Manipulation and Hygiene: Standing Equipment Used: Rolling walker;Long-handled shoe horn;Long-handled sponge;Reacher;Sock aid ADL Comments: Wife present. pt has all AE except LHS. Discussed where he can obtain sponge. Pt practiced with reacher, shoe horn and sock aid. He needed mod cues for adhering to THPs with AE use especially to not turn L foot inward and to keep upright posture to no exceed 90 degrees. Discussed lengthening handle of his shoe horn to make handle  longer. He did have some difficulty with getting sock to come all the way onto his foot as it slides off sock aid but pt feels it is his slick stockings that made this more difficult. Feel he will get better with practice.     OT Diagnosis: Generalized weakness  OT Problem List: Decreased strength;Decreased knowledge of use of DME or AE;Decreased knowledge of precautions OT Treatment Interventions: Self-care/ADL training;DME and/or AE instruction;Patient/family education;Therapeutic activities   OT Goals(Current goals can be found in the care plan section) Acute Rehab OT Goals Patient Stated Goal: none stated OT Goal Formulation: With patient Time For Goal Achievement: 07/31/13 Potential to Achieve Goals: Good  Visit Information  Last OT Received On: 07/24/13 Assistance Needed: +1 History of Present Illness: S/P revision posterior THA with bone graft.       Prior Functioning     Home Living Family/patient expects to be discharged to:: Private residence Living Arrangements: Spouse/significant other Available Help at Discharge: Family Type of Home: House Home Access: Stairs to enter [employer] of Steps:  2 Entrance Stairs-Rails:  Right Home Equipment: Walker - 2 wheels;Cane - single point;Crutches;Bedside commode;Adaptive equipment Adaptive Equipment: Reacher;Sock aid;Long-handled shoe horn Prior Function Level of Independence: Independent Communication Communication: No difficulties         Vision/Perception     Cognition  Cognition Arousal/Alertness: Awake/alert Behavior During Therapy: WFL for tasks assessed/performed Overall Cognitive Status: Within Functional Limits for tasks assessed    Extremity/Trunk Assessment Upper Extremity Assessment Upper Extremity Assessment: Overall WFL for tasks assessed     Mobility Transfers Transfers: Sit to Stand;Stand to Sit Sit to Stand: 4: Min guard;With upper extremity assist;From chair/3-in-1 Stand to Sit: 4: Min guard;With upper extremity assist;To chair/3-in-1 Details for Transfer Assistance: cues for posterior precautions and weightbear.     Exercise     Balance Balance Balance Assessed: Yes Dynamic Standing Balance Dynamic Standing - Level of Assistance: 5: Stand by assistance   End of Session OT - End of Session Equipment Utilized During Treatment: Gait belt Activity Tolerance: Patient tolerated treatment well Patient left: in chair;with call bell/phone within reach;with family/visitor present  GO     Lennox Laity [phone] 07/24/2013, 10:34 AM

## 2013-07-25 ENCOUNTER — Encounter (HOSPITAL_COMMUNITY): Payer: Self-pay | Admitting: Orthopedic Surgery

## 2013-07-25 NOTE — Discharge Summary (Signed)
Physician Discharge Summary  Patient ID: Francisco Charles MRN: [id number] DOB/AGE: 12-19-1950 62 y.o.  Admit date: 07/23/2013 Discharge date: 07/24/2013   Procedures:  Procedure(s) (LRB): LEFT TOTAL HIP REVISION ARTHROPLASTY WITH BONE GRAFT (Left)  Attending Physician:  Dr. Durene Romans   Admission Diagnoses:   Left hip pain s/p left total hip arthroplasty  Discharge Diagnoses:  Principal Problem:   S/P left TH revision Active Problems:   Expected blood loss anemia   Overweight (BMI 25.0-29.9)  Past Medical History  Diagnosis Date  . Hypertension     HPI: Francisco Charles, 62 y.o. male, has a history of pain and functional disability in the left hip due to pain and patient has failed non-surgical conservative treatments for greater than 12 weeks to include NSAID's and/or analgesics and activity modification. The indications for the revision total hip arthroplasty are loosening of one or more components. Onset of symptoms was gradual starting 2 years ago with gradually worsening course since that time. Prior procedures on the left hip include arthroplasty. Patient currently rates pain in the left hip at 7 out of 10 with activity. There is worsening of pain with activity and weight bearing, trendelenberg gait and pain that interfers with activities of daily living. Patient has evidence of prosthetic loosening by imaging studies. This condition presents safety issues increasing the risk of falls. There is no current active signs of infection. Risks, benefits and expectations were discussed with the patient. Patient understand the risks, benefits and expectations and wishes to proceed with surgery.  PCP: No primary provider on file.   Discharged Condition: good  Hospital Course:  Patient underwent the above stated procedure on 07/23/2013. Patient tolerated the procedure well and brought to the recovery room in good condition and subsequently to the floor.  POD #1 BP: 164/83 ; Pulse:  62 ; Temp: 98.1 F (36.7 C) ; Resp: 18 Pt's foley was removed, as well as the hemovac drain removed. IV was changed to a saline lock. Patient reports pain as mild, pain well controlled. No events throughout the night. Ready to be discharged home. Neurovascular intact, dorsiflexion/plantar flexion intact, incision: dressing C/D/I, no cellulitis present and compartment soft.   LABS  Basename    HGB  11.8  HCT  35.1    Discharge Exam: General appearance: alert, cooperative and no distress Extremities: Homans sign is negative, no sign of DVT, no edema, redness or tenderness in the calves or thighs and no ulcers, gangrene or trophic changes  Disposition:   Home-Health Care Svc with follow up in 2 weeks   Follow-up Information   Follow up with [name], MD. Schedule an appointment as soon as possible for a visit in 2 weeks.   Specialty:  Orthopedic Surgery   Contact information:   [address] [place] [address] [phone]       Discharge Orders   Future Orders Complete By Expires   Call MD / Call 911  As directed    Comments:     If you experience chest pain or shortness of breath, CALL 911 and be transported to the hospital emergency room.  If you develope a fever above 101 F, pus (white drainage) or increased drainage or redness at the wound, or calf pain, call your surgeon's office.   Change dressing  As directed    Comments:     Maintain surgical dressing for 10-14 days, then replace with 4x4 guaze and tape. Keep the area dry and clean.  Constipation Prevention  As directed    Comments:     Drink plenty of fluids.  Prune juice may be helpful.  You may use a stool softener, such as Colace (over the counter) 100 mg twice a day.  Use MiraLax (over the counter) for constipation as needed.   Diet - low sodium heart healthy  As directed    Discharge instructions  As directed    Comments:     Maintain surgical dressing for 10-14 days, then replace with  gauze and tape. Keep the area dry and clean until follow up. Follow up in 2 weeks at [facility]. Call with any questions or concerns.   TED hose  As directed    Comments:     Use stockings (TED hose) for 2 weeks on both leg(s).  You may remove them at night for sleeping.   Touch down weight bearing  As directed    Comments:     25% WB left leg        Medication List         aspirin 325 MG EC tablet  Take 1 tablet (325 mg total) by mouth 2 (two) times daily.     DSS 100 MG Caps  Take 100 mg by mouth 2 (two) times daily.     ferrous sulfate 325 (65 FE) MG tablet  Take 1 tablet (325 mg total) by mouth 3 (three) times daily after meals.     HYDROcodone-acetaminophen 7.5-325 MG per tablet  Commonly known as:  NORCO  Take 1-2 tablets by mouth every 4 (four) hours as needed for pain.     methocarbamol 500 MG tablet  Commonly known as:  ROBAXIN  Take 1 tablet (500 mg total) by mouth every 6 (six) hours as needed (muscle spasms).     polyethylene glycol packet  Commonly known as:  MIRALAX / GLYCOLAX  Take 17 g by mouth 2 (two) times daily.     quinapril-hydrochlorothiazide 20-12.5 MG per tablet  Commonly known as:  ACCURETIC  Take 1 tablet by mouth daily.         Signed: Anastasio Auerbach. Dinita Migliaccio   PAC  07/25/2013, 6:06 PM

## 2014-07-17 LAB — LIPID PANEL
CHOLESTEROL: 166 mg/dL (ref 0–200)
HDL: 67 mg/dL (ref 35–70)
LDL Cholesterol: 90 mg/dL

## 2014-07-17 LAB — BASIC METABOLIC PANEL
CREATININE: 1.1 mg/dL (ref 0.6–1.3)
GLUCOSE: 115 mg/dL

## 2014-07-17 LAB — PSA: PSA: 0.5

## 2015-05-07 ENCOUNTER — Other Ambulatory Visit: Payer: Self-pay | Admitting: Family Medicine

## 2015-06-12 ENCOUNTER — Other Ambulatory Visit: Payer: Self-pay | Admitting: Family Medicine

## 2015-07-17 ENCOUNTER — Telehealth: Payer: Self-pay | Admitting: Family Medicine

## 2015-07-17 DIAGNOSIS — I10 Essential (primary) hypertension: Secondary | ICD-10-CM

## 2015-07-17 DIAGNOSIS — R7303 Prediabetes: Secondary | ICD-10-CM

## 2015-07-17 DIAGNOSIS — Z125 Encounter for screening for malignant neoplasm of prostate: Secondary | ICD-10-CM

## 2015-07-17 NOTE — Telephone Encounter (Signed)
Pt is requesting a lab slip to pick up on Monday to have labs before his CPE appt on Friday.  CB#[phone]/MW

## 2015-07-17 NOTE — Telephone Encounter (Signed)
Please advised as directed below. Patient verbalized understanding.

## 2015-07-17 NOTE — Telephone Encounter (Signed)
Please advise 

## 2015-07-17 NOTE — Telephone Encounter (Signed)
Order is ready to be picked up. Need to be fasting when labs are drawn.

## 2015-07-22 LAB — HEMOGLOBIN A1C
ESTIMATED AVERAGE GLUCOSE: 126 mg/dL
HEMOGLOBIN A1C: 6 % — AB (ref 4.8–5.6)

## 2015-07-22 LAB — RENAL FUNCTION PANEL
Albumin: 4.4 g/dL (ref 3.6–4.8)
BUN / CREAT RATIO: 11 (ref 10–22)
BUN: 11 mg/dL (ref 8–27)
CALCIUM: 10 mg/dL (ref 8.6–10.2)
CHLORIDE: 97 mmol/L (ref 97–108)
CO2: 26 mmol/L (ref 18–29)
Creatinine, Ser: 1.02 mg/dL (ref 0.76–1.27)
GFR calc Af Amer: 91 mL/min/{1.73_m2} (ref >59)
GFR calc non Af Amer: 78 mL/min/{1.73_m2} (ref >59)
Glucose: 108 mg/dL — ABNORMAL HIGH (ref 65–99)
PHOSPHORUS: 3.8 mg/dL (ref 2.5–4.5)
POTASSIUM: 3.7 mmol/L (ref 3.5–5.2)
SODIUM: 141 mmol/L (ref 134–144)

## 2015-07-22 LAB — PSA: Prostate Specific Ag, Serum: 0.4 ng/mL (ref 0.0–4.0)

## 2015-07-22 LAB — LIPID PANEL
CHOLESTEROL TOTAL: 173 mg/dL (ref 100–199)
Chol/HDL Ratio: 2.3 ratio units (ref 0.0–5.0)
HDL: 75 mg/dL (ref >39)
LDL Calculated: 86 mg/dL (ref 0–99)
Triglycerides: 61 mg/dL (ref 0–149)
VLDL Cholesterol Cal: 12 mg/dL (ref 5–40)

## 2015-07-22 LAB — TSH: TSH: 1.32 u[IU]/mL (ref 0.450–4.500)

## 2015-07-25 ENCOUNTER — Encounter: Payer: Self-pay | Admitting: Family Medicine

## 2015-07-25 ENCOUNTER — Ambulatory Visit (INDEPENDENT_AMBULATORY_CARE_PROVIDER_SITE_OTHER): Payer: No Typology Code available for payment source | Admitting: Family Medicine

## 2015-07-25 VITALS — BP 110/60 | HR 80 | Temp 98.6°F | Resp 16 | Ht 67.0 in | Wt 176.0 lb

## 2015-07-25 DIAGNOSIS — R7309 Other abnormal glucose: Secondary | ICD-10-CM

## 2015-07-25 DIAGNOSIS — I10 Essential (primary) hypertension: Secondary | ICD-10-CM

## 2015-07-25 DIAGNOSIS — Z0000 Encounter for general adult medical examination without abnormal findings: Secondary | ICD-10-CM

## 2015-07-25 DIAGNOSIS — Z86010 Personal history of colonic polyps: Secondary | ICD-10-CM | POA: Insufficient documentation

## 2015-07-25 DIAGNOSIS — N402 Nodular prostate without lower urinary tract symptoms: Secondary | ICD-10-CM | POA: Insufficient documentation

## 2015-07-25 DIAGNOSIS — L719 Rosacea, unspecified: Secondary | ICD-10-CM | POA: Insufficient documentation

## 2015-07-25 DIAGNOSIS — R7303 Prediabetes: Secondary | ICD-10-CM

## 2015-07-25 NOTE — Patient Instructions (Signed)
   Please bring a copy of your last colonoscopy report for our records.    I recommend that you get a flu vaccine this year. Please call our office at [phone] at your earliest convenience to schedule a flu shot.    I recommend that you get a shingles vaccine (Zostavax). You may want to contact your insurance company to find out if this vaccine is covered. Please call our office at [phone] at your earliest convenience to schedule this vaccine.    PSA  Component Value Date   PSA 0.4 07/21/2015   Cholesterol  Component Value Date   CHOL 173 07/21/2015   HDL 75 07/21/2015   LDLCALC 86 07/21/2015   TRIG 61 07/21/2015   CHOLHDL 2.3 07/21/2015   Hgba1c  Component Value Date   HGBA1C 6.0* 07/21/2015   Average glucose 126 07/21/2015   Thryoid function  Component Value Date   TSH 1.320 07/21/2015

## 2015-07-25 NOTE — Progress Notes (Signed)
Patient: Francisco Charles, Male    DOB: Oct 16, 1951, 64 y.o.   MRN: [id number] Visit Date: 07/25/2015  Today's Provider: Lelon Huh, MD   Chief Complaint  Patient presents with  . Annual Exam  . Hypertension   Subjective:    Annual physical exam Francisco Charles is a 64 y.o. male who presents today for health maintenance and complete physical. He feels well. He reports exercising yes. He reports he is sleeping fairly well.  -----------------------------------------------------------------  Hypertension, follow-up:  BP Readings from Last 3 Encounters:  07/25/15 110/60  07/24/13 120/66  07/17/13 150/85    He was last seen for hypertension 6 months ago.  BP at that visit was 132/80. Management changes since that visit include none. He reports good compliance with treatment. He is not having side effects. none  He is exercising. He is not adherent to low salt diet.   Outside blood pressures are 120/70. He is experiencing none.  Patient denies none.   Cardiovascular risk factors include none.  Use of agents associated with hypertension: none.     Weight trend: stable Wt Readings from Last 3 Encounters:  07/25/15 176 lb (79.833 kg)  07/23/13 177 lb (80.287 kg)  07/17/13 177 lb (80.287 kg)    Current diet: well balanced  ------------------------------------------------------------------------ Follow-up for pre-diabetes from 07/22/2014; counseled patient to exercise and avoid sweets and starchy foods. Follow-up for impotence from 07/22/2014; no changes were made, patient is no longer taking Cialis.    Review of Systems  Constitutional: Negative.   HENT: Positive for congestion and postnasal drip. Negative for sneezing.   Eyes: Positive for redness.  Respiratory: Positive for wheezing. Negative for cough and shortness of breath.   Cardiovascular: Negative.   Gastrointestinal: Negative.   Endocrine: Negative.   Genitourinary: Negative.   Musculoskeletal:  Negative.   Skin: Negative.   Allergic/Immunologic: Negative.   Neurological: Negative.   Hematological: Negative.   Psychiatric/Behavioral: Negative.     Social History He  reports that he quit smoking about 2 years ago. His smoking use included Cigarettes. He has a 15 pack-year smoking history. He has never used smokeless tobacco. He reports that he does not drink alcohol or use illicit drugs. Social History   Social History  . Marital Status: Married    Spouse Name: N/A  . Number of Children: 1  . Years of Education: N/A   Occupational History  . Employed     Works full time with [employer]  . Smoking status: Former Smoker -- 0.50 packs/day for 30 years    Types: Cigarettes    Quit date: 11/29/2012  . Smokeless tobacco: Never Used  . Alcohol Use: No  . Drug Use: No  . Sexual Activity: Yes   Other Topics Concern  . None   Social History Narrative    Patient Active Problem List   Diagnosis Date Noted  . History of colonic polyps 07/25/2015  . 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 Rosacea 07/25/2015  . Pre-diabetes 07/17/2015  . Essential hypertension 07/17/2015  . Prostate cancer screening 07/17/2015  . Expected blood loss anemia 07/24/2013  . Overweight (BMI 25.0-29.9) 07/24/2013  . S/P left TH revision 07/23/2013  . History of tobacco use 04/18/2008  . Carpal tunnel syndrome 03/07/2008  . 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 Degenerative joint disease of pelvic region 06/29/2000    Past Surgical History  Procedure Laterality Date  .  Joint replacement Left 10-31-2000  . Total hip revision Left 07/23/2013    Procedure: LEFT TOTAL HIP REVISION ARTHROPLASTY WITH BONE GRAFT;  Surgeon: Mauri Pole, MD;  Location: [facility];  Service: Orthopedics;  Laterality: Left;  . Total hip arthroplasty Left 2001  . Prostate surgery  09/23/2011    Prostate Biopsy Dr. Jacqlyn Larsen    Family History  Family  Status  Relation Status Death Age  . Mother Deceased 88    old age  . Father Deceased 8    Accident  . Sister Alive   . Brother Alive   . Sister Alive   . Sister Alive   . Sister Alive   . Brother Alive   . Brother Alive    His family history is negative for Heart attack, Cancer, Diabetes, and Heart disease.    No Known Allergies  Previous Medications   ASPIRIN 81 MG TABLET    Take 1 tablet by mouth daily.   FLUTICASONE (FLONASE) 50 MCG/ACT NASAL SPRAY    USE 2 SPRAYS IN EACH NOSTRIL DAILY   QUINAPRIL-HYDROCHLOROTHIAZIDE (ACCURETIC) 20-12.5 MG PER TABLET    Take 1 tablet by mouth daily.    Patient Care Team: Birdie Sons, MD as PCP - General (Family Medicine) Murrell Redden, MD as Consulting Physician (Urology) Manya Silvas, MD (Gastroenterology)     Objective:   Vitals: BP 110/60 mmHg  Pulse 80  Temp(Src) 98.6 F (37 C) (Oral)  Resp 16  Ht 5\' 7"  (1.702 m)  Wt 176 lb (79.833 kg)  BMI 27.56 kg/m2  SpO2 96%   Physical Exam  General Appearance:    Alert, cooperative, no distress, appears stated age  Head:    Normocephalic, without obvious abnormality, atraumatic  Eyes:    PERRL, conjunctiva/corneas clear, EOM's intact, fundi    benign, both eyes       Ears:    Normal TM's and external ear canals, both ears  Nose:   Nares normal, septum midline, mucosa normal, no drainage   or sinus tenderness  Throat:   Lips, mucosa, and tongue normal; teeth and gums normal  Neck:   Supple, symmetrical, trachea midline, no adenopathy;       thyroid:  No enlargement/tenderness/nodules; no carotid   bruit or JVD  Back:     Symmetric, no curvature, ROM normal, no CVA tenderness  Lungs:     Clear to auscultation bilaterally, respirations unlabored  Chest wall:    No tenderness or deformity  Heart:    Regular rate and rhythm, S1 and S2 normal, no murmur, rub   or gallop  Abdomen:     Soft, non-tender, bowel sounds active all four quadrants,    no masses, no organomegaly    Genitalia:    deferred  Rectal:    deferred  Extremities:   Extremities normal, atraumatic, no cyanosis or edema  Pulses:   2+ and symmetric all extremities  Skin:   Skin color, texture, turgor normal, no rashes or lesions  Lymph nodes:   Cervical, supraclavicular, and axillary nodes normal  Neurologic:   CNII-XII intact. Normal strength, sensation and reflexes      throughout     Depression Screen PHQ 2/9 Scores 07/25/2015  PHQ - 2 Score 0  PHQ- 9 Score 0    PSA  Component Value Date   PSA 0.4 07/21/2015   Cholesterol  Component Value Date   CHOL 173 07/21/2015   HDL 75 07/21/2015   LDLCALC 86 07/21/2015  TRIG 61 07/21/2015   CHOLHDL 2.3 07/21/2015   Hgba1c  Component Value Date   HGBA1C 6.0* 07/21/2015   Average glucose 126 07/21/2015   Thryoid function  Component Value Date   TSH 1.320 07/21/2015      Assessment & Plan:     Routine Health Maintenance and Physical Exam  Exercise Activities and Dietary recommendations Goals    None      Immunization History  Administered Date(s) Administered  . Tdap 03/22/2006       Discussed health benefits of physical activity, and encouraged him to engage in regular exercise appropriate for his age and condition.    --------------------------------------------------------------------    1. Routine general medical examination at a health care facility Doing well. Has yearly follow up with Dr. Jacqlyn Larsen for prostate exam. He states he had colonoscopy within the last couple of years at [facility] and will bring a copy of the report.  - EKG 12-Lead  2. Pre-diabetes Continue avoid sweets and starchy foods.   3. Essential hypertension well controlled Continue current medications.

## 2015-11-12 ENCOUNTER — Other Ambulatory Visit: Payer: Self-pay | Admitting: *Deleted

## 2015-11-12 MED ORDER — QUINAPRIL-HYDROCHLOROTHIAZIDE 20-12.5 MG PO TABS: 1.0000 | ORAL_TABLET | Freq: Every day | ORAL | Status: DC

## 2016-02-26 ENCOUNTER — Other Ambulatory Visit: Payer: Self-pay | Admitting: Family Medicine

## 2016-02-26 DIAGNOSIS — J309 Allergic rhinitis, unspecified: Secondary | ICD-10-CM

## 2016-05-15 ENCOUNTER — Other Ambulatory Visit: Payer: Self-pay | Admitting: Family Medicine

## 2016-07-12 ENCOUNTER — Encounter: Payer: Self-pay | Admitting: Family Medicine

## 2016-07-12 ENCOUNTER — Telehealth: Payer: Self-pay | Admitting: Family Medicine

## 2016-07-12 ENCOUNTER — Encounter: Payer: Self-pay | Admitting: *Deleted

## 2016-07-12 ENCOUNTER — Ambulatory Visit (INDEPENDENT_AMBULATORY_CARE_PROVIDER_SITE_OTHER)
Admission: EM | Admit: 2016-07-12 | Discharge: 2016-07-12 | Disposition: A | Discharge status: Home / Self Care | Payer: No Typology Code available for payment source | Source: Home / Self Care | Attending: Emergency Medicine | Admitting: Emergency Medicine

## 2016-07-12 ENCOUNTER — Ambulatory Visit (INDEPENDENT_AMBULATORY_CARE_PROVIDER_SITE_OTHER): Payer: No Typology Code available for payment source | Admitting: Family Medicine

## 2016-07-12 VITALS — BP 110/70 | HR 84 | Temp 98.0°F | Ht 67.0 in | Wt 163.0 lb

## 2016-07-12 DIAGNOSIS — R1310 Dysphagia, unspecified: Secondary | ICD-10-CM | POA: Diagnosis not present

## 2016-07-12 DIAGNOSIS — H9192 Unspecified hearing loss, left ear: Secondary | ICD-10-CM | POA: Diagnosis not present

## 2016-07-12 DIAGNOSIS — R319 Hematuria, unspecified: Secondary | ICD-10-CM | POA: Diagnosis not present

## 2016-07-12 DIAGNOSIS — R1084 Generalized abdominal pain: Secondary | ICD-10-CM

## 2016-07-12 DIAGNOSIS — R7303 Prediabetes: Secondary | ICD-10-CM | POA: Diagnosis not present

## 2016-07-12 NOTE — Discharge Instructions (Signed)
Follow up with Dr. Kathyrn Sheriff within a week. Go to the ER for the signs and symptoms we discussed

## 2016-07-12 NOTE — ED Triage Notes (Signed)
Gradual onset left ear dullness and fullness over past 2 weeks. Denies pain and injury.

## 2016-07-12 NOTE — ED Provider Notes (Signed)
HPI  SUBJECTIVE:  Francisco Charles is a 65 y.o. male who presents with hearing loss in his left ear for 1 week. States that he picked up the phone, and was unable to "hear anything". He tried Debrox without improvement. There are no other aggravating or alleviating factors. He denies nausea, vomiting, fevers, URI-like symptoms, ear pain, nasal congestion, rhinorrhea. He does report occasional buzzing/tinnitus. No otorrhea, foreign body insertion, fullness. No sensation of fluid behind his ear. He does not wear hearing aids. No recent swimming. No pain with traction on his ear. This is not associated which opening his mouth, chewing. Denies sore throat. No difficulty with balance, headache, trauma, exposure to loud noises, he states that he does work around heavy machinery once or twice a month. He had similar symptoms like this before for 5 years ago when he was found to have cerumen impaction. He takes only quinapril/hydrochlorothiazide and aspirin 81 mg daily. Denies other meds including NSAIDs. He has a past mental history of left hip replacement. No history of cerebral tumor, strokes, otitis media, diabetes.    Past Medical History:  Diagnosis Date  . Carpal tunnel syndrome   . History of measles   . Hyperplastic colonic polyp 2006   Excised Dr. Tiffany Kocher  . Osteoarthrosis   . Prostate nodule     Past Surgical History:  Procedure Laterality Date  . JOINT REPLACEMENT Left 10-31-2000  . PROSTATE SURGERY  09/23/2011   Prostate Biopsy Dr. Jacqlyn Larsen  . TOTAL HIP ARTHROPLASTY Left 2001  . TOTAL HIP REVISION Left 07/23/2013   Procedure: LEFT TOTAL HIP REVISION ARTHROPLASTY WITH BONE GRAFT;  Surgeon: Mauri Pole, MD;  Location: [facility];  Service: Orthopedics;  Laterality: Left;    Family History  Problem Relation Age of Onset  . Heart attack Neg Hx   . Cancer Neg Hx   . Diabetes Neg Hx   . Heart disease Neg Hx     Social History  Substance Use Topics  . Smoking status: Former Smoker   Packs/day: 0.50    Years: 30.00    Types: Cigarettes    Quit date: 11/29/2012  . Smokeless tobacco: Never Used  . Alcohol use No    No current facility-administered medications for this encounter.   Current Outpatient Prescriptions:  .  aspirin 81 MG tablet, Take 1 tablet by mouth daily., Disp: , Rfl:  .  quinapril-hydrochlorothiazide (ACCURETIC) 20-12.5 MG tablet, TAKE 1 TABLET BY MOUTH DAILY., Disp: 30 tablet, Rfl: 3 .  fluticasone (FLONASE) 50 MCG/ACT nasal spray, USE 2 SPRAYS IN EACH NOSTRIL DAILY, Disp: 16 g, Rfl: 6  No Known Allergies   ROS  As noted in HPI.   Physical Exam  BP 135/88 (BP Location: Left Arm)   Temp 98.2 F (36.8 C) (Oral)   Resp 16   SpO2 98%   Constitutional: Well developed, well nourished, no acute distress Eyes:  EOMI, conjunctiva normal bilaterally HENT: Normocephalic, atraumatic,mucus membranes moist. No nasal congestion. Bilateral TMs normal. Left TM intact. Left external ear canal intact. No perforation, fluid behind area. No erythema, bulging or dullness. No pain with traction on pinna. No tenderness over the TMJ. Oropharynx normal. Grossly decreased hearing on the left more so than the right, although hearing is intact in the left ear. Lymph: No cervical lymphadenopathy Respiratory: Normal inspiratory effort Cardiovascular: Normal rate GI: nondistended skin: No rash, skin intact Musculoskeletal: no deformities Neurologic: Alert & oriented x 3, no focal neuro deficits, cranial nurse 2 through 12 grossly  intact, coordination normal, gait steady Psychiatric: Speech and behavior appropriate   ED Course   Medications - No data to display  Orders Placed This Encounter  Procedures  . Ambulatory referral to ENT    Referral Priority:   Urgent    Referral Type:   Consultation    Referral Reason:   Specialty Services Required    Requested Specialty:   Otolaryngology    Number of Visits Requested:   1    No results found for this or any  previous visit (from the past 24 hour(s)). No results found.  ED Clinical Impression  Decreased hearing of left ear   ED Assessment/Plan  No evidence of obstruction, infection, perforation, fluid behind ear. Suggested that patient could try antihistamines to see if this alleviates his symptoms, however, patient declined to do so. It does not really appear to be eustachian tube dysfunction or TMJ disorder. Do not have tuning fork available to distinguish between sensorineural and conductive hearing loss. No evidence of stroke. Will refer to ENT for further testing and management. patient states that he is a patient of Dr. Kathyrn Sheriff, and will follow-up with him within the week for reevaluation in [place]. Discussed medical decision-making, plan for follow up, signs and symptoms that should prompt return to the emergency department. Patient agrees with plan.  *This clinic note was created using Dragon dictation software. Therefore, there may be occasional mistakes despite careful proofreading.  ?   Melynda Ripple, MD 07/12/16 [phone]

## 2016-07-12 NOTE — Telephone Encounter (Signed)
Per [facility] ultrasound of abdomen should be either RUQ limited [id number] or a complete [id number]

## 2016-07-12 NOTE — Progress Notes (Signed)
       Patient: Francisco Charles Male    DOB: 1951-05-13   65 y.o.   MRN: [id number] Visit Date: 07/12/2016  Today's Provider: Lelon Huh, MD   Chief Complaint  Patient presents with  . Follow-up    [facility] urgent care   Subjective:    HPI  Follow up urgent care visit  Patient was seen at [facility] for abdominal and low back pain on 07/10/2016. He was treated for blood in urine muscle pain. Treatment for this included starting cipro 500 mg, increasing methocarbamol to 750 mg and starting oleprazole. He reports excellent compliance with treatment. He reports this condition is Unchanged. Urine was sent for cultures which was negative. Has follow up with Dr. Jacqlyn Larsen at end of September.    Also reports difficulty with food going doing and regurgitating back up after eating for the last few months.     ------------------------------------------------------------------------------------     No Known Allergies Current Meds  Medication Sig  . aspirin 81 MG tablet Take 1 tablet by mouth daily.  . ciprofloxacin (CIPRO) 500 MG tablet Take 1 tablet by mouth 2 (two) times daily.  . methocarbamol (ROBAXIN) 750 MG tablet 1-2 tablets up to 4 times/day if needed for muscle pain and spasm  . omeprazole (PRILOSEC) 20 MG capsule Take 1 capsule by mouth daily.  . quinapril-hydrochlorothiazide (ACCURETIC) 20-12.5 MG tablet TAKE 1 TABLET BY MOUTH DAILY.    Review of Systems  Constitutional: Positive for appetite change and chills.  Gastrointestinal: Positive for abdominal pain and constipation.  Neurological: Positive for headaches.    Social History  Substance Use Topics  . Smoking status: Former Smoker    Packs/day: 0.50    Years: 30.00    Types: Cigarettes    Quit date: 11/29/2012  . Smokeless tobacco: Never Used  . Alcohol use No   Objective:   BP 110/70 (BP Location: Right Arm, Patient Position: Sitting, Cuff Size: Large)   Pulse 84   Temp 98 F  (36.7 C) (Oral)   Ht 5\' 7"  (1.702 m)   Wt 163 lb (73.9 kg)   BMI 25.53 kg/m   Physical Exam  General Appearance:    Alert, cooperative, no distress  Eyes:    PERRL, conjunctiva/corneas clear, EOM's intact       Lungs:     Clear to auscultation bilaterally, respirations unlabored  Heart:    Regular rate and rhythm  Abdomen:  Mild diffuse tenderness, worse RUQ. Left CVAT. No masses. No rebound or guarding.         Assessment & Plan:     1. Generalized abdominal pain  - Comprehensive metabolic panel - CBC  2. Hematuria Urine cultures negative. If [place] negative will consider abdominal CT.  - US Abdomen Complete; Future  3. Pre-diabetes Due for a1c.    - Hemoglobin A1c  4. Dysphagia Consider upper GI or referral for endoscopy after reviewing u/s.     The entirety of the information documented in the History of Present Illness, Review of Systems and Physical Exam were personally obtained by me. Portions of this information were initially documented by Lynford Humphrey, CMA and reviewed by me for thoroughness and accuracy.       Lelon Huh, MD  [facility]

## 2016-07-12 NOTE — Telephone Encounter (Signed)
Needs to be complete. Thanks.

## 2016-07-13 ENCOUNTER — Telehealth: Payer: Self-pay | Admitting: Family Medicine

## 2016-07-13 ENCOUNTER — Inpatient Hospital Stay
Admission: EM | Admit: 2016-07-13 | Discharge: 2016-07-15 | DRG: 436 | Payer: No Typology Code available for payment source | Attending: Internal Medicine | Admitting: Internal Medicine

## 2016-07-13 ENCOUNTER — Emergency Department: Payer: No Typology Code available for payment source

## 2016-07-13 ENCOUNTER — Other Ambulatory Visit: Payer: Self-pay | Admitting: Family Medicine

## 2016-07-13 ENCOUNTER — Encounter: Payer: Self-pay | Admitting: Emergency Medicine

## 2016-07-13 DIAGNOSIS — R945 Abnormal results of liver function studies: Secondary | ICD-10-CM

## 2016-07-13 DIAGNOSIS — R109 Unspecified abdominal pain: Secondary | ICD-10-CM | POA: Diagnosis not present

## 2016-07-13 DIAGNOSIS — Z8619 Personal history of other infectious and parasitic diseases: Secondary | ICD-10-CM

## 2016-07-13 DIAGNOSIS — Z96642 Presence of left artificial hip joint: Secondary | ICD-10-CM | POA: Diagnosis present

## 2016-07-13 DIAGNOSIS — F17210 Nicotine dependence, cigarettes, uncomplicated: Secondary | ICD-10-CM | POA: Diagnosis not present

## 2016-07-13 DIAGNOSIS — K831 Obstruction of bile duct: Secondary | ICD-10-CM | POA: Diagnosis not present

## 2016-07-13 DIAGNOSIS — Z7982 Long term (current) use of aspirin: Secondary | ICD-10-CM

## 2016-07-13 DIAGNOSIS — Z86010 Personal history of colonic polyps: Secondary | ICD-10-CM | POA: Diagnosis not present

## 2016-07-13 DIAGNOSIS — Z716 Tobacco abuse counseling: Secondary | ICD-10-CM

## 2016-07-13 DIAGNOSIS — D831 Common variable immunodeficiency with predominant immunoregulatory T-cell disorders: Secondary | ICD-10-CM | POA: Diagnosis not present

## 2016-07-13 DIAGNOSIS — C221 Intrahepatic bile duct carcinoma: Principal | ICD-10-CM | POA: Diagnosis present

## 2016-07-13 DIAGNOSIS — R1110 Vomiting, unspecified: Secondary | ICD-10-CM | POA: Diagnosis not present

## 2016-07-13 DIAGNOSIS — E43 Unspecified severe protein-calorie malnutrition: Secondary | ICD-10-CM | POA: Diagnosis not present

## 2016-07-13 DIAGNOSIS — K8021 Calculus of gallbladder without cholecystitis with obstruction: Secondary | ICD-10-CM | POA: Diagnosis present

## 2016-07-13 DIAGNOSIS — R7401 Elevation of levels of liver transaminase levels: Secondary | ICD-10-CM

## 2016-07-13 DIAGNOSIS — Z79899 Other long term (current) drug therapy: Secondary | ICD-10-CM | POA: Diagnosis not present

## 2016-07-13 DIAGNOSIS — I10 Essential (primary) hypertension: Secondary | ICD-10-CM | POA: Diagnosis present

## 2016-07-13 DIAGNOSIS — F17200 Nicotine dependence, unspecified, uncomplicated: Secondary | ICD-10-CM | POA: Diagnosis present

## 2016-07-13 DIAGNOSIS — K838 Other specified diseases of biliary tract: Secondary | ICD-10-CM | POA: Diagnosis not present

## 2016-07-13 DIAGNOSIS — K8689 Other specified diseases of pancreas: Secondary | ICD-10-CM | POA: Diagnosis present

## 2016-07-13 DIAGNOSIS — R748 Abnormal levels of other serum enzymes: Secondary | ICD-10-CM | POA: Diagnosis not present

## 2016-07-13 DIAGNOSIS — R1084 Generalized abdominal pain: Secondary | ICD-10-CM

## 2016-07-13 DIAGNOSIS — M1990 Unspecified osteoarthritis, unspecified site: Secondary | ICD-10-CM | POA: Diagnosis not present

## 2016-07-13 DIAGNOSIS — R740 Nonspecific elevation of levels of transaminase and lactic acid dehydrogenase [LDH]: Secondary | ICD-10-CM

## 2016-07-13 DIAGNOSIS — K219 Gastro-esophageal reflux disease without esophagitis: Secondary | ICD-10-CM | POA: Diagnosis present

## 2016-07-13 DIAGNOSIS — R5383 Other fatigue: Secondary | ICD-10-CM | POA: Diagnosis not present

## 2016-07-13 DIAGNOSIS — R5381 Other malaise: Secondary | ICD-10-CM | POA: Diagnosis not present

## 2016-07-13 DIAGNOSIS — K869 Disease of pancreas, unspecified: Secondary | ICD-10-CM | POA: Diagnosis present

## 2016-07-13 DIAGNOSIS — R7989 Other specified abnormal findings of blood chemistry: Secondary | ICD-10-CM | POA: Insufficient documentation

## 2016-07-13 DIAGNOSIS — R531 Weakness: Secondary | ICD-10-CM | POA: Diagnosis not present

## 2016-07-13 LAB — COMPREHENSIVE METABOLIC PANEL
ALBUMIN: 4.4 g/dL (ref 3.6–4.8)
ALBUMIN: 4 g/dL (ref 3.5–5.0)
ALK PHOS: 921 IU/L — AB (ref 39–117)
ALT: 351 IU/L — AB (ref 0–44)
ALT: 323 U/L — AB (ref 17–63)
AST: 246 IU/L — ABNORMAL HIGH (ref 0–40)
AST: 203 U/L — AB (ref 15–41)
Albumin/Globulin Ratio: 1.3 (ref 1.2–2.2)
Alkaline Phosphatase: 896 U/L — ABNORMAL HIGH (ref 38–126)
Anion gap: 9 (ref 5–15)
BUN / CREAT RATIO: 8 — AB (ref 10–24)
BUN: 8 mg/dL (ref 8–27)
BUN: 8 mg/dL (ref 6–20)
Bilirubin Total: 5.7 mg/dL — ABNORMAL HIGH (ref 0.0–1.2)
CHLORIDE: 93 mmol/L — AB (ref 101–111)
CO2: 26 mmol/L (ref 18–29)
CO2: 31 mmol/L (ref 22–32)
CREATININE: 1.06 mg/dL (ref 0.76–1.27)
CREATININE: 0.9 mg/dL (ref 0.61–1.24)
Calcium: 10.3 mg/dL — ABNORMAL HIGH (ref 8.6–10.2)
Calcium: 10.4 mg/dL — ABNORMAL HIGH (ref 8.9–10.3)
Chloride: 88 mmol/L — ABNORMAL LOW (ref 96–106)
GFR calc Af Amer: 86 mL/min/{1.73_m2} (ref >59)
GFR calc Af Amer: >60 mL/min (ref >60)
GFR calc non Af Amer: 74 mL/min/{1.73_m2} (ref >59)
GLUCOSE: 105 mg/dL — AB (ref 65–99)
GLUCOSE: 115 mg/dL — AB (ref 65–99)
Globulin, Total: 3.3 g/dL (ref 1.5–4.5)
POTASSIUM: 3.5 mmol/L (ref 3.5–5.1)
Potassium: 3.7 mmol/L (ref 3.5–5.2)
SODIUM: 133 mmol/L — AB (ref 135–145)
Sodium: 135 mmol/L (ref 134–144)
Total Bilirubin: 6.7 mg/dL — ABNORMAL HIGH (ref 0.3–1.2)
Total Protein: 7.7 g/dL (ref 6.0–8.5)
Total Protein: 8.2 g/dL — ABNORMAL HIGH (ref 6.5–8.1)

## 2016-07-13 LAB — URINALYSIS COMPLETE WITH MICROSCOPIC (ARMC ONLY)
BACTERIA UA: NONE SEEN
Glucose, UA: NEGATIVE mg/dL
KETONES UR: NEGATIVE mg/dL
LEUKOCYTES UA: NEGATIVE
Nitrite: NEGATIVE
PH: 5 (ref 5.0–8.0)
PROTEIN: NEGATIVE mg/dL
SQUAMOUS EPITHELIAL / LPF: NONE SEEN
Specific Gravity, Urine: 1.013 (ref 1.005–1.030)

## 2016-07-13 LAB — CBC
HEMATOCRIT: 51.8 % — AB (ref 37.5–51.0)
HEMATOCRIT: 52.2 % — AB (ref 40.0–52.0)
HEMOGLOBIN: 17.2 g/dL (ref 12.6–17.7)
Hemoglobin: 17.9 g/dL (ref 13.0–18.0)
MCH: 30.9 pg (ref 26.6–33.0)
MCH: 30.7 pg (ref 26.0–34.0)
MCHC: 33.2 g/dL (ref 31.5–35.7)
MCHC: 34.2 g/dL (ref 32.0–36.0)
MCV: 93 fL (ref 79–97)
MCV: 89.7 fL (ref 80.0–100.0)
PLATELETS: 254 10*3/uL (ref 150–440)
Platelets: 260 10*3/uL (ref 150–379)
RBC: 5.57 x10E6/uL (ref 4.14–5.80)
RBC: 5.82 MIL/uL (ref 4.40–5.90)
RDW: 14.6 % (ref 12.3–15.4)
RDW: 14.8 % — AB (ref 11.5–14.5)
WBC: 9.7 10*3/uL (ref 3.4–10.8)
WBC: 10.2 10*3/uL (ref 3.8–10.6)

## 2016-07-13 LAB — LIPASE, BLOOD: LIPASE: 26 U/L (ref 11–51)

## 2016-07-13 LAB — HEMOGLOBIN A1C
ESTIMATED AVERAGE GLUCOSE: 137 mg/dL
Hgb A1c MFr Bld: 6.4 % — ABNORMAL HIGH (ref 4.8–5.6)

## 2016-07-13 MED ORDER — ONDANSETRON HCL 4 MG PO TABS: 4.0000 mg | ORAL_TABLET | Freq: Four times a day (QID) | ORAL | Status: DC | PRN

## 2016-07-13 MED ORDER — ONDANSETRON HCL 4 MG/2ML IJ SOLN: 4.0000 mg | Freq: Four times a day (QID) | INTRAMUSCULAR | Status: DC | PRN

## 2016-07-13 MED ORDER — SODIUM CHLORIDE 0.9 % IV BOLUS (SEPSIS)
1000.0000 mL | Freq: Once | INTRAVENOUS | Status: AC
  Administered 2016-07-13: 1000 mL via INTRAVENOUS

## 2016-07-13 MED ORDER — HYDROCODONE-ACETAMINOPHEN 5-325 MG PO TABS
1.0000 | ORAL_TABLET | ORAL | Status: DC | PRN
  Administered 2016-07-14 (×2): 2 via ORAL
  Administered 2016-07-14: 1 via ORAL
  Filled 2016-07-13 (×2): qty 2
  Filled 2016-07-13: qty 1

## 2016-07-13 MED ORDER — ACETAMINOPHEN 650 MG RE SUPP
650.0000 mg | Freq: Four times a day (QID) | RECTAL | Status: DC | PRN
Start: 2016-07-13 — End: 2016-07-15

## 2016-07-13 MED ORDER — DIATRIZOATE MEGLUMINE & SODIUM 66-10 % PO SOLN
15.0000 mL | Freq: Once | ORAL | Status: AC
  Administered 2016-07-13: 15 mL via ORAL

## 2016-07-13 MED ORDER — ACETAMINOPHEN 325 MG PO TABS: 650.0000 mg | ORAL_TABLET | Freq: Four times a day (QID) | ORAL | Status: DC | PRN

## 2016-07-13 MED ORDER — ENOXAPARIN SODIUM 40 MG/0.4ML ~~LOC~~ SOLN
40.0000 mg | Freq: Every day | SUBCUTANEOUS | Status: DC
  Administered 2016-07-13: 22:00:00 40 mg via SUBCUTANEOUS
  Filled 2016-07-13: qty 0.4

## 2016-07-13 MED ORDER — IOPAMIDOL (ISOVUE-300) INJECTION 61%
100.0000 mL | Freq: Once | INTRAVENOUS | Status: AC | PRN
  Administered 2016-07-13: 100 mL via INTRAVENOUS
  Filled 2016-07-13: qty 100

## 2016-07-13 MED ORDER — SODIUM CHLORIDE 0.9 % IV SOLN
INTRAVENOUS | Status: DC
  Administered 2016-07-13 – 2016-07-15 (×2): via INTRAVENOUS

## 2016-07-13 MED ORDER — PANTOPRAZOLE SODIUM 40 MG PO TBEC
40.0000 mg | DELAYED_RELEASE_TABLET | Freq: Every day | ORAL | Status: DC
  Administered 2016-07-14 – 2016-07-15 (×2): 40 mg via ORAL
  Filled 2016-07-13 (×2): qty 1

## 2016-07-13 NOTE — ED Provider Notes (Signed)
[facility] Emergency Department Provider Note   ____________________________________________   First MD Initiated Contact with Patient 07/13/16 1554     (approximate)  I have reviewed the triage vital signs and the nursing notes.   HISTORY  Chief Complaint Abdominal Pain and Nausea   HPI Francisco Charles is a 65 y.o. male with a history of hypertension who is presenting to the emergency department today with 3 days of right-sided abdominal pain radiating to his right pelvis as well as his right shoulder. He also says that over the past 3 days he has had difficulty eating solids and says that leg "have difficulty going down." He says the pain as a gnawing pain and he has not had any relief with his prescribed medications from his primary care doctor who gave him a muscle relaxer. He was also diagnosed with urinary tract infection as well as reflux. He says that he also has noticed yellowing of his eyes over the past several days. He came to the emergency department today because of difficulty with pain control after taking his medications without any relief. He was also called by his primary care doctor and found out that his urine culture came back negative and they told him to stop his antibiotics.   Past Medical History:  Diagnosis Date  . Carpal tunnel syndrome   . History of measles   . Hyperplastic colonic polyp 2006   Excised Dr. Tiffany Kocher  . Hypertension   . Osteoarthrosis   . Prostate nodule     Patient Active Problem List   Diagnosis Date Noted  . Abnormal LFTs (liver function tests) 07/13/2016  . Hematuria 07/12/2016  . Dysphagia 07/12/2016  . 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 Rosacea 07/25/2015  . Pre-diabetes 07/17/2015  . Essential hypertension 07/17/2015  . Overweight (BMI 25.0-29.9) 07/24/2013  . S/P left TH revision 07/23/2013  . History of tobacco use 04/18/2008  . Carpal tunnel syndrome 03/07/2008  .  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 JOINT REPLACEMENT Left 10-31-2000  . PROSTATE SURGERY  09/23/2011   Prostate Biopsy Dr. Jacqlyn Larsen  . TOTAL HIP ARTHROPLASTY Left 2001  . TOTAL HIP REVISION Left 07/23/2013   Procedure: LEFT TOTAL HIP REVISION ARTHROPLASTY WITH BONE GRAFT;  Surgeon: Mauri Pole, MD;  Location: [facility];  Service: Orthopedics;  Laterality: Left;    Prior to Admission medications   Medication Sig Start Date End Date Taking? Authorizing Provider  aspirin 81 MG tablet Take 1 tablet by mouth daily. 03/14/07   Historical Provider, MD  ciprofloxacin (CIPRO) 500 MG tablet Take 1 tablet by mouth 2 (two) times daily. 07/10/16   Historical Provider, MD  methocarbamol (ROBAXIN) 750 MG tablet 1-2 tablets up to 4 times/day if needed for muscle pain and spasm 07/10/16   Historical Provider, MD  omeprazole (PRILOSEC) 20 MG capsule Take 1 capsule by mouth daily. 07/10/16   Historical Provider, MD  quinapril-hydrochlorothiazide (ACCURETIC) 20-12.5 MG tablet TAKE 1 TABLET BY MOUTH DAILY. 05/15/16   Birdie Sons, MD    Allergies Review of patient's allergies indicates no known allergies.  Family History  Problem Relation Age of Onset  . Heart attack Neg Hx   . Cancer Neg Hx   . Diabetes Neg Hx   . Heart disease Neg Hx     Social History Social History  Substance Use Topics  .  Smoking status: Former Smoker    Packs/day: 0.50    Years: 30.00    Types: Cigarettes    Quit date: 11/29/2012  . Smokeless tobacco: Never Used  . Alcohol use No    Review of Systems Constitutional: No fever/chills Eyes: No visual changes. ENT: No sore throat. Cardiovascular: Denies chest pain. Respiratory: Denies shortness of breath. Gastrointestinal: Says he has not a bowel movement since this past Friday Genitourinary: Dark urine Musculoskeletal: Right ilial pain  Skin: Negative for  rash. Neurological: Negative for headaches, focal weakness or numbness.  10-point ROS otherwise negative.  ____________________________________________   PHYSICAL EXAM:  VITAL SIGNS: ED Triage Vitals  Enc Vitals Group     BP 07/13/16 1329 122/81     Pulse Rate 07/13/16 1329 80     Resp 07/13/16 1329 18     Temp 07/13/16 1329 97.9 F (36.6 C)     Temp Source 07/13/16 1329 Oral     SpO2 07/13/16 1329 97 %     Weight 07/13/16 1329 165 lb (74.8 kg)     Height 07/13/16 1329 5\' 7"  (1.702 m)     Head Circumference --      Peak Flow --      Pain Score 07/13/16 1330 8     Pain Loc --      Pain Edu? --      Excl. in [place]? --     Constitutional: Alert and oriented. Well appearing and in no acute distress. Eyes:Positive for scleral icterus. PERRL. EOMI. Head: Atraumatic. Nose: No congestion/rhinnorhea. Mouth/Throat: Mucous membranes are moist.   Neck: No stridor.   Cardiovascular: Normal rate, regular rhythm. Grossly normal heart sounds.   Respiratory: Normal respiratory effort.  No retractions. Lungs CTAB. Gastrointestinal: Soft with right lower quadrant tenderness to palpation with is moderate and right upper quadrant mild tenderness to palpation. No left-sided tenderness to palpation. No distention. No CVA tenderness. Musculoskeletal: No lower extremity tenderness nor edema.  No joint effusions. No tenderness or deformity of the right side of the pelvis posteriorly. Neurologic:  Normal speech and language. No gross focal neurologic deficits are appreciated. No gait instability. Skin:  Skin is warm, dry and intact. No rash noted. Psychiatric: Mood and affect are normal. Speech and behavior are normal.  ____________________________________________   LABS (all labs ordered are listed, but only abnormal results are displayed)  Labs Reviewed  COMPREHENSIVE METABOLIC PANEL - Abnormal; Notable for the following:       Result Value   Sodium 133 (*)    Chloride 93 (*)    Glucose,  Bld 115 (*)    Calcium 10.4 (*)    Total Protein 8.2 (*)    AST 203 (*)    ALT 323 (*)    Alkaline Phosphatase 896 (*)    Total Bilirubin 6.7 (*)    All other components within normal limits  CBC - Abnormal; Notable for the following:    HCT 52.2 (*)    RDW 14.8 (*)    All other components within normal limits  URINALYSIS COMPLETEWITH MICROSCOPIC ([facility] ONLY) - Abnormal; Notable for the following:    Color, Urine AMBER (*)    APPearance CLEAR (*)    Bilirubin Urine 2+ (*)    Hgb urine dipstick 1+ (*)    All other components within normal limits  LIPASE, BLOOD  CEA  CA 19-9 (SERIAL)   ____________________________________________  EKG   ____________________________________________  RADIOLOGY  CT Abdomen Pelvis W Contrast (Accession [id number]) (Order  [id number])  Imaging  Date: 07/13/2016 Department: [facility] EMERGENCY DEPARTMENT Released By/Authorizing: Orbie Pyo, MD (auto-released)  PACS Images   Show images for CT Abdomen Pelvis W Contrast  Study Result   CLINICAL DATA:  65 year old male with right abdominal pain, most prominent in the right lower quadrant. Elevated liver function tests. Postprandial nausea. History of prostate biopsy.  EXAM: CT ABDOMEN AND PELVIS WITH CONTRAST  TECHNIQUE: Multidetector CT imaging of the abdomen and pelvis was performed using the standard protocol following bolus administration of intravenous contrast.  CONTRAST:  139mL ISOVUE-300 IOPAMIDOL (ISOVUE-300) INJECTION 61%  COMPARISON:  09/29/2012 bone protocol CT pelvis. No prior CT abdomen/pelvis.  FINDINGS: Lower chest: No significant pulmonary nodules or acute consolidative airspace disease.  Hepatobiliary: Normal liver size. Simple 0.8 cm liver cyst in segment 8 of the right liver lobe. No additional discrete liver lesions. There is slightly irregular annular wall thickening and mucosal hyperenhancement in the fundal gallbladder  wall, with loss of the normal fat plane between the fundal gallbladder wall and liver (series 2/ image 32). No radiopaque cholelithiasis. No significant gallbladder distention. No pericholecystic fluid. There is moderate diffuse intrahepatic biliary ductal dilatation. There is a focal stricture in the upper third of the common bile duct with associated common bile duct wall thickening and hyperenhancement (best seen on coronal series 5/ image 46).  Pancreas: No pancreatic duct dilation. There is a heterogeneous hypodense 2.1 x 1.5 cm mass at the posterior superior margin of the pancreatic neck (series 2/ image 29), which demonstrates ill-defined margins with infiltrative soft tissue extending into the porta hepatis along the common bile duct.  Spleen: Normal size. No mass.  Adrenals/Urinary Tract: There is irregular thickening of the bilateral adrenal glands, with the suggestion of small adrenal nodules bilaterally measuring 1.0 cm on the right (series 2/ image 24) and 1.3 cm on the left (series 2/ image 25). No hydronephrosis. Simple 2.0 cm interpolar left renal cyst. Simple 1.0 cm lower right renal cyst. No additional renal lesions. Limited visualization of the collapsed bladder due to streak artifact from left hip hardware with no gross bladder abnormality .  Stomach/Bowel: Grossly normal stomach. Normal caliber small bowel with no small bowel wall thickening. Normal appendix . Mild sigmoid diverticulosis, with no large bowel wall thickening or pericolonic fat stranding.  Vascular/Lymphatic: Atherosclerotic nonaneurysmal abdominal aorta. Patent portal, splenic, hepatic and renal veins. No pathologically enlarged lymph nodes in the abdomen or pelvis.  Reproductive: Poorly visualized prostate due to streak artifact from the left hip hardware. Prostate appears mildly enlarged.  Other: No pneumoperitoneum. No ascites. No focal fluid collection. There is a heterogeneously  enhancing 1.5 x 1.4 cm left paracolic gutter peritoneal nodule (series 2/image 44).  Musculoskeletal: There is widespread patchy sclerosis throughout the visualized thoracolumbar spine, sacrum, bilateral iliac bones and lower ribs bilaterally. Mild thoracolumbar spondylosis. Partially visualized left total hip arthroplasty.  IMPRESSION: 1. Moderate diffuse intrahepatic biliary ductal dilatation. 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 2. Hypodense 2.1 cm mass at the posterior superior margin of the pancreatic neck with associated infiltrative soft tissue extending into the porta hepatis. No main pancreatic duct dilation. This mass could represent a primary pancreatic adenocarcinoma or malignant peripancreatic lymph node. 3. Irregular annular wall thickening and hyperenhancement in the fundal gallbladder with loss of the normal fat plane between the fundal gallbladder and liver. Cannot exclude gallbladder carcinoma or serosal carcinomatosis in this location. 4. Left paracolic gutter peritoneal nodule suspicious  for peritoneal carcinomatosis. 5. Suggestion of small bilateral adrenal nodules, cannot exclude adrenal metastases. 6. MRI of the abdomen with MRCP without and with IV contrast may be useful for further evaluation of the above findings. 7. 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 Recommend correlation with serum PSA. 8. Additional findings include aortic atherosclerosis and mild sigmoid diverticulosis.   Electronically Signed   By: Ilona Sorrel M.D.   On: 07/13/2016 18:21     ____________________________________________   PROCEDURES  Procedure(s) performed:   Procedures  Critical Care performed:   ____________________________________________   INITIAL IMPRESSION / ASSESSMENT AND PLAN / ED COURSE  Pertinent labs & imaging results that were  available during my care of the patient were reviewed by me and considered in my medical decision making (see chart for details).  ----------------------------------------- 7:02 PM on 07/13/2016 -----------------------------------------  Patient with likely newly discovered cancer on the CAT scan causing biliary obstruction with resultant elevated liver labs. I discussed case with Dr. Grayland Ormond of the oncology service will be seeing the patient in the morning. 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 I will be admitting the patient in the hospital. Signed out to Dr. Tressia Miners.  Clinical Course     ____________________________________________   FINAL CLINICAL IMPRESSION(S) / ED DIAGNOSES  Elevated transaminases. Hyperbilirubinemia. Abdominal cancer.    NEW MEDICATIONS STARTED DURING THIS VISIT:  New Prescriptions   No medications on file     Note:  This document was prepared using Dragon voice recognition software and may include unintentional dictation errors. Dictation #1 [id number]  [id number]    Orbie Pyo, MD 07/13/16 [id number]

## 2016-07-13 NOTE — H&P (Signed)
Francisco Charles at [place] NAME: Francisco Charles    MR#:  [id number]  DATE OF BIRTH:  1951-02-16  DATE OF ADMISSION:  07/13/2016  PRIMARY CARE PHYSICIAN: Lelon Huh, MD   REQUESTING/REFERRING PHYSICIAN: Dr. Larae Grooms  CHIEF COMPLAINT:   Chief Complaint  Patient presents with  . Abdominal Pain  . Nausea    HISTORY OF PRESENT ILLNESS:  Francisco Charles  is a 65 y.o. male with a known history of  Hypertension, arthritis presents to hospital secondary to worsening abdominal pain and inability to eat solid foods for almost a week. His abdominal pain was diffuse, mostly in the right upper quadrant and the lower abdomen radiating to his back. He's been nauseated, he says he was drinking liquids fine but unable to keep solids down. Undigested food coming out per patient. Jaundiced eyes, but patient states he did not notice that. His primary care doctor ordered antibiotics for possible UTI without relief so he presented to the emergency room. 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  PAST MEDICAL HISTORY:   Past Medical History:  Diagnosis Date  . Carpal tunnel syndrome   . History of measles   . Hyperplastic colonic polyp 2006   Excised Dr. Tiffany Kocher  . Hypertension   . Osteoarthrosis   . Prostate nodule     PAST SURGICAL HISTORY:   Past Surgical History:  Procedure Laterality Date  . JOINT REPLACEMENT Left 10-31-2000  . PROSTATE SURGERY  09/23/2011   Prostate Biopsy Dr. Jacqlyn Larsen  . TOTAL HIP ARTHROPLASTY Left 2001  . TOTAL HIP REVISION Left 07/23/2013   Procedure: LEFT TOTAL HIP REVISION ARTHROPLASTY WITH BONE GRAFT;  Surgeon: Francisco Pole, MD;  Location: [facility];  Service: Orthopedics;  Laterality: Left;    SOCIAL HISTORY:   Social History  Substance Use Topics  . Smoking status: Former Smoker    Packs/day:  0.50    Years: 30.00    Types: Cigarettes    Quit date: 11/29/2012  . Smokeless tobacco: Never Used  . Alcohol use No    FAMILY HISTORY:   Family History  Problem Relation Age of Onset  . Heart attack Neg Hx   . Cancer Neg Hx   . Diabetes Neg Hx   . Heart disease Neg Hx     DRUG ALLERGIES:  No Known Allergies  REVIEW OF SYSTEMS:   Review of Systems  Constitutional: Positive for malaise/fatigue. Negative for chills, fever and weight loss.  HENT: Negative for ear discharge, ear pain, hearing loss and nosebleeds.   Eyes: Negative for blurred vision, double vision and photophobia.  Respiratory: Negative for cough, hemoptysis, shortness of breath and wheezing.   Cardiovascular: Negative for chest pain, palpitations, orthopnea and leg swelling.  Gastrointestinal: Positive for abdominal pain, nausea and vomiting. Negative for constipation, diarrhea, heartburn and melena.  Genitourinary: Negative for dysuria, frequency, hematuria and urgency.  Musculoskeletal: Positive for back pain. Negative for myalgias and neck pain.  Skin: Negative for rash.  Neurological: Negative for dizziness, tingling, tremors, sensory change, speech change, focal weakness and headaches.  Endo/Heme/Allergies: Does not bruise/bleed easily.  Psychiatric/Behavioral: Negative for depression.    MEDICATIONS AT HOME:   Prior to Admission medications   Medication Sig Start Date End Date Taking? Authorizing Provider  aspirin 81 MG tablet Take 1 tablet by mouth daily. 03/14/07   Historical Provider,  MD  ciprofloxacin (CIPRO) 500 MG tablet Take 1 tablet by mouth 2 (two) times daily. 07/10/16   Historical Provider, MD  methocarbamol (ROBAXIN) 750 MG tablet 1-2 tablets up to 4 times/day if needed for muscle pain and spasm 07/10/16   Historical Provider, MD  omeprazole (PRILOSEC) 20 MG capsule Take 1 capsule by mouth daily. 07/10/16   Historical Provider, MD  quinapril-hydrochlorothiazide (ACCURETIC) 20-12.5 MG tablet TAKE  1 TABLET BY MOUTH DAILY. 05/15/16   Birdie Sons, MD      VITAL SIGNS:  Blood pressure 122/81, pulse 80, temperature 97.9 F (36.6 C), temperature source Oral, resp. rate 18, height 5\' 7"  (1.702 m), weight 74.8 kg (165 lb), SpO2 97 %.  PHYSICAL EXAMINATION:   Physical Exam  GENERAL:  65 y.o.-year-old patient lying in the bed with no acute distress.  EYES: Pupils equal, round, reactive to light and accommodation.Positive scleral icterus. Extraocular muscles intact.  HEENT: Head atraumatic, normocephalic. Oropharynx and nasopharynx clear.  NECK:  Supple, no jugular venous distention. No thyroid enlargement, no tenderness.  LUNGS: Normal breath sounds bilaterally, no wheezing, rales,rhonchi or crepitation. No use of accessory muscles of respiration.  CARDIOVASCULAR: S1, S2 normal. No murmurs, rubs, or gallops.  ABDOMEN: Soft, tender in the lower abdominal quadrants with no guarding or rigidity, nondistended. Bowel sounds present. No organomegaly or mass.  EXTREMITIES: No pedal edema, cyanosis, or clubbing.  NEUROLOGIC: Cranial nerves II through XII are intact. Muscle strength 5/5 in all extremities. Sensation intact. Gait not checked.  PSYCHIATRIC: The patient is alert and oriented x 3.  SKIN: No obvious rash, lesion, or ulcer.   LABORATORY PANEL:   CBC  Recent Labs Lab 07/13/16 1335  WBC 10.2  HGB 17.9  HCT 52.2*  PLT 254   ------------------------------------------------------------------------------------------------------------------  Chemistries   Recent Labs Lab 07/13/16 1335  NA 133*  K 3.5  CL 93*  CO2 31  GLUCOSE 115*  BUN 8  CREATININE 0.90  CALCIUM 10.4*  AST 203*  ALT 323*  ALKPHOS 896*  BILITOT 6.7*   ------------------------------------------------------------------------------------------------------------------  Cardiac Enzymes No results for input(s): TROPONINI in the last 168  hours. ------------------------------------------------------------------------------------------------------------------  RADIOLOGY:  Ct Abdomen Pelvis W Contrast  Result Date: 07/13/2016 CLINICAL DATA:  65 year old male with right abdominal pain, most prominent in the right lower quadrant. Elevated liver function tests. Postprandial nausea. History of prostate biopsy. EXAM: CT ABDOMEN AND PELVIS WITH CONTRAST TECHNIQUE: Multidetector CT imaging of the abdomen and pelvis was performed using the standard protocol following bolus administration of intravenous contrast. CONTRAST:  127mL ISOVUE-300 IOPAMIDOL (ISOVUE-300) INJECTION 61% COMPARISON:  09/29/2012 bone protocol CT pelvis. No prior CT abdomen/pelvis. FINDINGS: Lower chest: No significant pulmonary nodules or acute consolidative airspace disease. Hepatobiliary: Normal liver size. Simple 0.8 cm liver cyst in segment 8 of the right liver lobe. No additional discrete liver lesions. There is slightly irregular annular wall thickening and mucosal hyperenhancement in the fundal gallbladder wall, with loss of the normal fat plane between the fundal gallbladder wall and liver (series 2/ image 32). No radiopaque cholelithiasis. No significant gallbladder distention. No pericholecystic fluid. There is moderate diffuse intrahepatic biliary ductal dilatation. There is a focal stricture in the upper third of the common bile duct with associated common bile duct wall thickening and hyperenhancement (best seen on coronal series 5/ image 46). Pancreas: No pancreatic duct dilation. There is a heterogeneous hypodense 2.1 x 1.5 cm mass at the posterior superior margin of the pancreatic neck (series 2/ image 29), which demonstrates ill-defined margins  with infiltrative soft tissue extending into the porta hepatis along the common bile duct. Spleen: Normal size. No mass. Adrenals/Urinary Tract: There is irregular thickening of the bilateral adrenal glands, with the  suggestion of small adrenal nodules bilaterally measuring 1.0 cm on the right (series 2/ image 24) and 1.3 cm on the left (series 2/ image 25). No hydronephrosis. Simple 2.0 cm interpolar left renal cyst. Simple 1.0 cm lower right renal cyst. No additional renal lesions. Limited visualization of the collapsed bladder due to streak artifact from left hip hardware with no gross bladder abnormality . Stomach/Bowel: Grossly normal stomach. Normal caliber small bowel with no small bowel wall thickening. Normal appendix . Mild sigmoid diverticulosis, with no large bowel wall thickening or pericolonic fat stranding. Vascular/Lymphatic: Atherosclerotic nonaneurysmal abdominal aorta. Patent portal, splenic, hepatic and renal veins. No pathologically enlarged lymph nodes in the abdomen or pelvis. Reproductive: Poorly visualized prostate due to streak artifact from the left hip hardware. Prostate appears mildly enlarged. Other: No pneumoperitoneum. No ascites. No focal fluid collection. There is a heterogeneously enhancing 1.5 x 1.4 cm left paracolic gutter peritoneal nodule (series 2/image 44). Musculoskeletal: There is widespread patchy sclerosis throughout the visualized thoracolumbar spine, sacrum, bilateral iliac bones and lower ribs bilaterally. Mild thoracolumbar spondylosis. Partially visualized left total hip arthroplasty. IMPRESSION: 1. Moderate diffuse intrahepatic biliary ductal dilatation. 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 2. Hypodense 2.1 cm mass at the posterior superior margin of the pancreatic neck with associated infiltrative soft tissue extending into the porta hepatis. No main pancreatic duct dilation. This mass could represent a primary pancreatic adenocarcinoma or malignant peripancreatic lymph node. 3. Irregular annular wall thickening and hyperenhancement in the fundal gallbladder with loss of the normal fat plane between the fundal gallbladder and liver. Cannot  exclude gallbladder carcinoma or serosal carcinomatosis in this location. 4. Left paracolic gutter peritoneal nodule suspicious for peritoneal carcinomatosis. 5. Suggestion of small bilateral adrenal nodules, cannot exclude adrenal metastases. 6. MRI of the abdomen with MRCP without and with IV contrast may be useful for further evaluation of the above findings. 7. 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 Recommend correlation with serum PSA. 8. Additional findings include aortic atherosclerosis and mild sigmoid diverticulosis. Electronically Signed   By: Ilona Sorrel M.D.   On: 07/13/2016 18:21    EKG:   Orders placed or performed in visit on 07/25/15  . EKG 12-Lead    IMPRESSION AND PLAN:   Francisco Charles  is a 65 y.o. male with a known history of  Hypertension, arthritis presents to hospital secondary to worsening abdominal pain and inability to eat solid foods for almost a week.  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 Management plans discussed with the patient, family and they are in agreement.  CODE STATUS: Full code  TOTAL TIME TAKING CARE OF THIS PATIENT: 50 minutes.    Gladstone Lighter M.D on 07/13/2016 at 8:02 PM  Between 7am to 6pm - Pager - [phone]  After 6pm go to [email or web address] - password [email or web address]  [employer]  Office  [phone]  CC: Primary care physician; Lelon Huh, MD

## 2016-07-13 NOTE — Telephone Encounter (Signed)
You put an order in EPIC for RUQ ultrasound marked as emergent.Pt is scheduled for abdominal Complete ultrasound on Thursday.Pt states that he is not available tomorrow and would rather keep it as it is.I just want to make sure you are ok with this and if test that is scheduled is the correct one

## 2016-07-13 NOTE — ED Triage Notes (Signed)
Pt complains of pain to lower back, soreness in abdomen and blood in urine for over 1 week. Pt states when he tries to eat he feels nausea. Pt states he was seen at [facility] on Sat and started on Cipro, Methocarbamol, and Prilosec. Pt states that the medication is not helping.

## 2016-07-13 NOTE — Progress Notes (Signed)
Please order RUQ ultrasound ASAP for elevated liver functions and abdominal pain

## 2016-07-13 NOTE — Telephone Encounter (Signed)
Thursday is fine.

## 2016-07-14 ENCOUNTER — Inpatient Hospital Stay: Payer: No Typology Code available for payment source

## 2016-07-14 ENCOUNTER — Telehealth: Payer: Self-pay | Admitting: Family Medicine

## 2016-07-14 DIAGNOSIS — F419 Anxiety disorder, unspecified: Secondary | ICD-10-CM

## 2016-07-14 DIAGNOSIS — R109 Unspecified abdominal pain: Secondary | ICD-10-CM

## 2016-07-14 DIAGNOSIS — Z87891 Personal history of nicotine dependence: Secondary | ICD-10-CM

## 2016-07-14 DIAGNOSIS — R634 Abnormal weight loss: Secondary | ICD-10-CM

## 2016-07-14 DIAGNOSIS — R1110 Vomiting, unspecified: Secondary | ICD-10-CM

## 2016-07-14 DIAGNOSIS — E43 Unspecified severe protein-calorie malnutrition: Secondary | ICD-10-CM | POA: Insufficient documentation

## 2016-07-14 DIAGNOSIS — R748 Abnormal levels of other serum enzymes: Secondary | ICD-10-CM

## 2016-07-14 DIAGNOSIS — Z79899 Other long term (current) drug therapy: Secondary | ICD-10-CM

## 2016-07-14 DIAGNOSIS — K869 Disease of pancreas, unspecified: Secondary | ICD-10-CM

## 2016-07-14 DIAGNOSIS — R630 Anorexia: Secondary | ICD-10-CM

## 2016-07-14 LAB — BASIC METABOLIC PANEL
Anion gap: 10 (ref 5–15)
BUN: 9 mg/dL (ref 6–20)
CHLORIDE: 98 mmol/L — AB (ref 101–111)
CO2: 27 mmol/L (ref 22–32)
CREATININE: 0.76 mg/dL (ref 0.61–1.24)
Calcium: 9.5 mg/dL (ref 8.9–10.3)
Glucose, Bld: 99 mg/dL (ref 65–99)
POTASSIUM: 3.4 mmol/L — AB (ref 3.5–5.1)
SODIUM: 135 mmol/L (ref 135–145)

## 2016-07-14 LAB — CBC
HCT: 48.2 % (ref 40.0–52.0)
Hemoglobin: 16.1 g/dL (ref 13.0–18.0)
MCH: 30.3 pg (ref 26.0–34.0)
MCHC: 33.4 g/dL (ref 32.0–36.0)
MCV: 90.7 fL (ref 80.0–100.0)
PLATELETS: 209 10*3/uL (ref 150–440)
RBC: 5.32 MIL/uL (ref 4.40–5.90)
RDW: 14.7 % — AB (ref 11.5–14.5)
WBC: 10 10*3/uL (ref 3.8–10.6)

## 2016-07-14 LAB — BILIRUBIN, DIRECT: Bilirubin, Direct: 3.7 mg/dL — ABNORMAL HIGH (ref 0.1–0.5)

## 2016-07-14 MED ORDER — POTASSIUM CHLORIDE 20 MEQ PO PACK
20.0000 meq | PACK | Freq: Once | ORAL | Status: AC
Start: 2016-07-14 — End: 2016-07-14
  Administered 2016-07-14: 14:00:00 20 meq via ORAL
  Filled 2016-07-14: qty 1

## 2016-07-14 MED ORDER — BOOST / RESOURCE BREEZE PO LIQD
1.0000 | Freq: Three times a day (TID) | ORAL | Status: DC
  Administered 2016-07-14 (×2): 1 via ORAL

## 2016-07-14 MED ORDER — GADOBENATE DIMEGLUMINE 529 MG/ML IV SOLN
15.0000 mL | Freq: Once | INTRAVENOUS | Status: AC | PRN
  Administered 2016-07-14: 15 mL via INTRAVENOUS

## 2016-07-14 NOTE — Consult Note (Signed)
[place]  [phone] [phone]  ID: Francisco Charles OB: Jan 03, 1951  MR#: [id number]  [id number]  Patient Care Team: Birdie Sons, MD as PCP - General (Family Medicine) Murrell Redden, MD as Consulting Physician (Urology) Manya Silvas, MD (Gastroenterology)  CHIEF COMPLAINT: Abdominal pain, pancreatic mass concerning for underlying malignancy.  INTERVAL HISTORY: Patient is 65 year old male who noted increasing [name] pain over the past 1-2 weeks. Patient states he has also been unable to eat over the past 4-5 days. He also endorses unintentional weight loss over the same time frame. 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 Currently, he feels improved and nearly back to his baseline. Patient is anxious to go home. He is no neurologic complaints. He denies any fevers. He denies any chest pain, shortness breath, or cough. He denies any melena or hematochezia. He has no urinary complaints. Patient otherwise feels well and offers no further specific complaints.  REVIEW OF SYSTEMS:   Review of Systems  Constitutional: Positive for weight loss. Negative for fever and malaise/fatigue.  Respiratory: Negative.  Negative for cough and shortness of breath.   Cardiovascular: Negative.  Negative for chest pain.  Gastrointestinal: Positive for abdominal pain and vomiting. Negative for blood in stool, constipation, diarrhea, melena and nausea.  Genitourinary: Negative.   Musculoskeletal: Negative.   Neurological: Negative.  Negative for weakness.  Psychiatric/Behavioral: The patient is nervous/anxious.     As per HPI. Otherwise, a complete review of systems is negatve.  PAST MEDICAL HISTORY: Past Medical History:  Diagnosis Date  . Carpal tunnel syndrome   . History of measles   . Hyperplastic colonic polyp 2006   Excised Dr. Tiffany Kocher  . Hypertension   . Osteoarthrosis   . Prostate nodule     PAST SURGICAL HISTORY: Past Surgical History:  Procedure Laterality Date  . JOINT REPLACEMENT Left 10-31-2000  . PROSTATE SURGERY  09/23/2011   Prostate Biopsy Dr. Jacqlyn Larsen  . TOTAL HIP ARTHROPLASTY Left 2001  . TOTAL HIP REVISION Left 07/23/2013   Procedure: LEFT TOTAL HIP REVISION ARTHROPLASTY WITH BONE GRAFT;  Surgeon: Mauri Pole, MD;  Location: [facility];  Service: Orthopedics;  Laterality: Left;    FAMILY HISTORY: Family History  Problem Relation Age of Onset  . Heart attack Neg Hx   . Cancer Neg Hx   . Diabetes Neg Hx   . Heart disease Neg Hx        ADVANCED DIRECTIVES:    HEALTH MAINTENANCE: Social History  Substance Use Topics  . Smoking status: Former Smoker    Packs/day: 0.50    Years: 30.00    Types: Cigarettes    Quit date: 11/29/2012  . Smokeless tobacco: Never Used  . Alcohol use No     Colonoscopy:  PAP:  Bone density:  Lipid panel:  No Known Allergies  Current Facility-Administered Medications  Medication Dose Route Frequency Provider Last Rate Last Dose  . 0.9 %  sodium chloride infusion   Intravenous Continuous Fritzi Mandes, MD 75 mL/hr at 07/14/16 1157    . acetaminophen (TYLENOL) tablet 650 mg  650 mg Oral Q6H PRN Gladstone Lighter, MD       Or  . acetaminophen (TYLENOL) suppository 650 mg  650 mg Rectal Q6H PRN Gladstone Lighter, MD      . enoxaparin (LOVENOX) injection 40 mg  40 mg Subcutaneous QHS Radhika  Kalisetti, MD   40 mg at 07/13/16 2227  . feeding supplement (BOOST / RESOURCE BREEZE) liquid 1 Container  1 Container Oral TID BM Fritzi Mandes, MD   1 Container at 07/14/16 1400  . HYDROcodone-acetaminophen (NORCO/VICODIN) 5-325 MG per tablet 1-2 tablet  1-2 tablet Oral Q4H PRN Gladstone Lighter, MD   2 tablet at 07/14/16 1258  . ondansetron (ZOFRAN) tablet 4 mg  4 mg Oral Q6H PRN Gladstone Lighter, MD       Or  . ondansetron (ZOFRAN) injection 4 mg  4 mg Intravenous  Q6H PRN Gladstone Lighter, MD      . pantoprazole (PROTONIX) EC tablet 40 mg  40 mg Oral QAC breakfast Gladstone Lighter, MD   40 mg at 07/14/16 0826    OBJECTIVE: Vitals:   07/13/16 2151 07/14/16 0615  BP: (!) 162/90 (!) 146/71  Pulse: 71 64  Resp: 19 20  Temp: 97.5 F (36.4 C) 98 F (36.7 C)     Body mass index is 24.89 kg/m.    ECOG FS:0 - Asymptomatic  General: Well-developed, well-nourished, no acute distress. Eyes: Pink conjunctiva, anicteric sclera. HEENT: Normocephalic, moist mucous membranes, clear oropharnyx. Lungs: Clear to auscultation bilaterally. Heart: Regular rate and rhythm. No rubs, murmurs, or gallops. Abdomen: Soft, nontender, nondistended. No organomegaly noted, normoactive bowel sounds. Musculoskeletal: No edema, cyanosis, or clubbing. Neuro: Alert, answering all questions appropriately. Cranial nerves grossly intact. Skin: No rashes or petechiae noted. Psych: Normal affect. Lymphatics: No cervical, calvicular, axillary or inguinal LAD.   LAB RESULTS:  Lab Results  Component Value Date   NA 135 07/14/2016   K 3.4 (L) 07/14/2016   CL 98 (L) 07/14/2016   CO2 27 07/14/2016   GLUCOSE 99 07/14/2016   BUN 9 07/14/2016   CREATININE 0.76 07/14/2016   CALCIUM 9.5 07/14/2016   PROT 8.2 (H) 07/13/2016   ALBUMIN 4.0 07/13/2016   AST 203 (H) 07/13/2016   ALT 323 (H) 07/13/2016   ALKPHOS 896 (H) 07/13/2016   BILITOT 6.7 (H) 07/13/2016   GFRNONAA >60 07/14/2016   GFRAA >60 07/14/2016    Lab Results  Component Value Date   WBC 10.0 07/14/2016   HGB 16.1 07/14/2016   HCT 48.2 07/14/2016   MCV 90.7 07/14/2016   PLT 209 07/14/2016     STUDIES: Ct Abdomen Pelvis W Contrast  Result Date: 07/13/2016 CLINICAL DATA:  65 year old male with right abdominal pain, most prominent in the right lower quadrant. Elevated liver function tests. Postprandial nausea. History of prostate biopsy. EXAM: CT ABDOMEN AND PELVIS WITH CONTRAST TECHNIQUE: Multidetector CT  imaging of the abdomen and pelvis was performed using the standard protocol following bolus administration of intravenous contrast. CONTRAST:  134mL ISOVUE-300 IOPAMIDOL (ISOVUE-300) INJECTION 61% COMPARISON:  09/29/2012 bone protocol CT pelvis. No prior CT abdomen/pelvis. FINDINGS: Lower chest: No significant pulmonary nodules or acute consolidative airspace disease. Hepatobiliary: Normal liver size. Simple 0.8 cm liver cyst in segment 8 of the right liver lobe. No additional discrete liver lesions. There is slightly irregular annular wall thickening and mucosal hyperenhancement in the fundal gallbladder wall, with loss of the normal fat plane between the fundal gallbladder wall and liver (series 2/ image 32). No radiopaque cholelithiasis. No significant gallbladder distention. No pericholecystic fluid. There is moderate diffuse intrahepatic biliary ductal dilatation. There is a focal stricture in the upper third of the common bile duct with associated common bile duct wall thickening and hyperenhancement (best seen on coronal series 5/ image 46). Pancreas: No pancreatic duct dilation.  There is a heterogeneous hypodense 2.1 x 1.5 cm mass at the posterior superior margin of the pancreatic neck (series 2/ image 29), which demonstrates ill-defined margins with infiltrative soft tissue extending into the porta hepatis along the common bile duct. Spleen: Normal size. No mass. Adrenals/Urinary Tract: There is irregular thickening of the bilateral adrenal glands, with the suggestion of small adrenal nodules bilaterally measuring 1.0 cm on the right (series 2/ image 24) and 1.3 cm on the left (series 2/ image 25). No hydronephrosis. Simple 2.0 cm interpolar left renal cyst. Simple 1.0 cm lower right renal cyst. No additional renal lesions. Limited visualization of the collapsed bladder due to streak artifact from left hip hardware with no gross bladder abnormality . Stomach/Bowel: Grossly normal stomach. Normal caliber  small bowel with no small bowel wall thickening. Normal appendix . Mild sigmoid diverticulosis, with no large bowel wall thickening or pericolonic fat stranding. Vascular/Lymphatic: Atherosclerotic nonaneurysmal abdominal aorta. Patent portal, splenic, hepatic and renal veins. No pathologically enlarged lymph nodes in the abdomen or pelvis. Reproductive: Poorly visualized prostate due to streak artifact from the left hip hardware. Prostate appears mildly enlarged. Other: No pneumoperitoneum. No ascites. No focal fluid collection. There is a heterogeneously enhancing 1.5 x 1.4 cm left paracolic gutter peritoneal nodule (series 2/image 44). Musculoskeletal: There is widespread patchy sclerosis throughout the visualized thoracolumbar spine, sacrum, bilateral iliac bones and lower ribs bilaterally. Mild thoracolumbar spondylosis. Partially visualized left total hip arthroplasty. IMPRESSION: 1. Moderate diffuse intrahepatic biliary ductal dilatation. 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 2. Hypodense 2.1 cm mass at the posterior superior margin of the pancreatic neck with associated infiltrative soft tissue extending into the porta hepatis. No main pancreatic duct dilation. This mass could represent a primary pancreatic adenocarcinoma or malignant peripancreatic lymph node. 3. Irregular annular wall thickening and hyperenhancement in the fundal gallbladder with loss of the normal fat plane between the fundal gallbladder and liver. Cannot exclude gallbladder carcinoma or serosal carcinomatosis in this location. 4. Left paracolic gutter peritoneal nodule suspicious for peritoneal carcinomatosis. 5. Suggestion of small bilateral adrenal nodules, cannot exclude adrenal metastases. 6. MRI of the abdomen with MRCP without and with IV contrast may be useful for further evaluation of the above findings. 7. 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 Recommend correlation with serum PSA. 8. Additional findings include aortic atherosclerosis and mild sigmoid diverticulosis. Electronically Signed   By: Ilona Sorrel M.D.   On: 07/13/2016 18:21   Mr 3d Recon At Scanner  Result Date: 07/14/2016 CLINICAL DATA:  Common bile duct stricture. EXAM: MRI ABDOMEN WITHOUT AND WITH CONTRAST (INCLUDING MRCP) TECHNIQUE: Multiplanar multisequence MR imaging of the abdomen was performed both before and after the administration of intravenous contrast. Heavily T2-weighted images of the biliary and pancreatic ducts were obtained, and three-dimensional MRCP images were rendered by post processing. CONTRAST:  7mL MULTIHANCE GADOBENATE DIMEGLUMINE 529 MG/ML IV SOLN COMPARISON:  CT 07/13/2016 has high FINDINGS: Lower chest:  Lung bases are clear. Hepatobiliary: There is intrahepatic and extrahepatic biliary duct dilatation. Stricturing of the bile duct at the junction of the common hepatic duct and common bile duct (image 12, series 9). More distal stricturing of the common hepatic duct (image 13, series 9). Both the strictures occur over approximately 1 cm segment. The common bile duct appears normal caliber at the ampullary level. Within the hepatic parenchyma. The bile ducts have a mild beaded appearance. There is suggestion of  sludge within the bile ducts of the RIGHT hepatic lobe (image 17, series 3 image 16 series 3). There is a cholesterol gallstone within the fundus of the gallbladder seen on the precontrast T1 weighted imaging (series 14). Pancreas: Along the ventral aspect of the pancreatic head there is a lesion which is either of the pancreas or adjacent the pancreas which is hypointense to normal pancreatic parenchyma measuring 20 mm image 35, series 4. This lesion is hypo enhancing on the arterial phase (image 34, series 15). Favor lesion to be and within the pancreatic parenchyma. There is no significant pancreatic duct dilatation.  Spleen: Normal spleen. Adrenals/urinary tract: Thickening and adrenal glands which is incompletely characterized. Stomach/Bowel: Stomach and limited of the small bowel is unremarkable Vascular/Lymphatic: Abdominal aortic normal caliber. No retroperitoneal periportal lymphadenopathy. Musculoskeletal: Heterogeneous marrow signal within the entirety of the spine (image 19, series 189) para IMPRESSION: 1. Intrahepatic and extrahepatic duct dilatation with segments of bile duct stricturing in the common hepatic duct and common bile duct. Additionally there is mild beaded appearance of the intrahepatic ducts and cholestasis in the RIGHT hepatic lobe. Differential considerations include cholangiocarcinoma of the extrahepatic ducts, occult malignant stricturing of the extrahepatic ducts and primary sclerosing cholangitis with or without associated cholangiocarcinoma. 2. Lesion within or adjacent to the pancreatic head concerning for pancreatic neoplasm versus a malignant lymph node. Recommend endoscopic ultrasound with tissue sampling. Recommend tissue sampling of the extrahepatic ducts at same time. 3. Cholesterol gallstone within fundus of the gallbladder. 4. 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 Electronically Signed   By: Suzy Bouchard M.D.   On: 07/14/2016 11:21   Mr Jeananne Rama W/wo Cm/mrcp  Result Date: 07/14/2016 CLINICAL DATA:  Common bile duct stricture. EXAM: MRI ABDOMEN WITHOUT AND WITH CONTRAST (INCLUDING MRCP) TECHNIQUE: Multiplanar multisequence MR imaging of the abdomen was performed both before and after the administration of intravenous contrast. Heavily T2-weighted images of the biliary and pancreatic ducts were obtained, and three-dimensional MRCP images were rendered by post processing. CONTRAST:  36mL MULTIHANCE GADOBENATE DIMEGLUMINE 529 MG/ML IV SOLN COMPARISON:  CT 07/13/2016 has high FINDINGS: Lower chest:  Lung bases are clear. Hepatobiliary: There  is intrahepatic and extrahepatic biliary duct dilatation. Stricturing of the bile duct at the junction of the common hepatic duct and common bile duct (image 12, series 9). More distal stricturing of the common hepatic duct (image 13, series 9). Both the strictures occur over approximately 1 cm segment. The common bile duct appears normal caliber at the ampullary level. Within the hepatic parenchyma. The bile ducts have a mild beaded appearance. There is suggestion of sludge within the bile ducts of the RIGHT hepatic lobe (image 17, series 3 image 16 series 3). There is a cholesterol gallstone within the fundus of the gallbladder seen on the precontrast T1 weighted imaging (series 14). Pancreas: Along the ventral aspect of the pancreatic head there is a lesion which is either of the pancreas or adjacent the pancreas which is hypointense to normal pancreatic parenchyma measuring 20 mm image 35, series 4. This lesion is hypo enhancing on the arterial phase (image 34, series 15). Favor lesion to be and within the pancreatic parenchyma. There is no significant pancreatic duct dilatation. Spleen: Normal spleen. Adrenals/urinary tract: Thickening and adrenal glands which is incompletely characterized. Stomach/Bowel: Stomach and limited of the small bowel is unremarkable Vascular/Lymphatic: Abdominal aortic normal caliber. No retroperitoneal periportal lymphadenopathy. Musculoskeletal: Heterogeneous marrow signal within the entirety of the spine (image 19, series  189) para IMPRESSION: 1. Intrahepatic and extrahepatic duct dilatation with segments of bile duct stricturing in the common hepatic duct and common bile duct. Additionally there is mild beaded appearance of the intrahepatic ducts and cholestasis in the RIGHT hepatic lobe. Differential considerations include cholangiocarcinoma of the extrahepatic ducts, occult malignant stricturing of the extrahepatic ducts and primary sclerosing cholangitis with or without  associated cholangiocarcinoma. 2. Lesion within or adjacent to the pancreatic head concerning for pancreatic neoplasm versus a malignant lymph node. Recommend endoscopic ultrasound with tissue sampling. Recommend tissue sampling of the extrahepatic ducts at same time. 3. Cholesterol gallstone within fundus of the gallbladder. 4. 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 Electronically Signed   By: Suzy Bouchard M.D.   On: 07/14/2016 11:21    ASSESSMENT: Abdominal pain, pancreatic mass concerning for underlying malignancy.  PLAN:    1. Pancreatic mass: Highly concerning for underlying malignancy. Most likely primary pancreatic or cholangiocarcinoma. Tumor markers are pending at time of dictation. MRCP and CT scans results reviewed independently. Patient will require ERCP with likely stenting as well as biopsy. Once patient has biopsy completed, he likely can be discharged for the remainder of the diagnostic workup as an outpatient. Please insure patient follows up in the [place] on Monday or Tuesday of next week for further evaluation and possible treatment planning. 2. Elevated liver enzymes: Likely secondary to obstructing mass. Patient will likely require ERCP as above.   Case discussed with Dr. Posey Pronto.  Appreciate consult, will follow.   Lloyd Huger, MD   07/14/2016 6:18 PM

## 2016-07-14 NOTE — Consult Note (Signed)
Consultation  Referring Provider:     Dr Tressia Miners Admit date:  07/13/16 Consult date        07/14/16 Reason for Consultation: Pancreatic mass              HPI:   Francisco Charles is a 65 y.o. male with history of colon polyps, htn, OA, admitted with right sided abdominal pain and scleral icterus- found to have CBD stricture, pancreatic mass, irregular gallbladder wall on CT, and left paracolic gutter peritoneal nodule suspicious for peritoneal carcinomatosis/possible adrenal metastases.  MR/MRCP done today with the following IMPRESSION: 1. Intrahepatic and extrahepatic duct dilatation with segments of bile duct stricturing in the common hepatic duct and common bile duct. Additionally there is mild beaded appearance of the intrahepatic ducts and cholestasis in the RIGHT hepatic lobe. Differential considerations include cholangiocarcinoma of the extrahepatic ducts, occult malignant stricturing of the extrahepatic ducts and primary sclerosing cholangitis with or without associated cholangiocarcinoma. 2. Lesion within or adjacent to the pancreatic head concerning for pancreatic neoplasm versus a malignant lymph node. Recommend endoscopic ultrasound with tissue sampling. Recommend tissue sampling of the extrahepatic ducts at same time. 3. Cholesterol gallstone within fundus of the gallbladder. 4. 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  Lab eval with CEA and Ca 19-9 pending. Total bilirubin 6/.7 with direct of 3.7. ALP 896, AST 203, ALT 323. CBC unremarkable.  Patient reports onset of early satiety, decreased appetite, and pain across the upper abdomen x 8d. States when he would try to eat solid foods, they would regurgitate back after 2-3 bites, but has been able to keep liquids down. Was constipated last week and took a laxative with subsequent loose stools. States he hadn't noticed his eyes turning yellow until now. Denies melena/hematocheia,  further GI complaints.  Liverwise, states he rarely drinks any etoh now, was incarcerated once for a short time. Denies illicits/IVDU, tattoos, blood transfusions/dialysis, herbs, family & personal history of any liver or pancreatic disease.   States he went to urgent care over the weekend for the above with some back pain, was treated for UTI/low back pain with Cipro Then was seen again Monday for continued abdominal pain by his PCP, was found to have some elevated lfts so [place] was ordered, but then pain increased so he came to the ED yesterday.  PREVIOUS ENDOSCOPIES:            Last colonoscopy done 3/16 by Dr Tiffany Kocher with a hyperplastic polyps. Has history of other colon polyps.  No history of EGD.    Past Medical History:  Diagnosis Date  . Carpal tunnel syndrome   . History of measles   . Hyperplastic colonic polyp 2006   Excised Dr. Tiffany Kocher  . Hypertension   . Osteoarthrosis   . Prostate nodule     Past Surgical History:  Procedure Laterality Date  . JOINT REPLACEMENT Left 10-31-2000  . PROSTATE SURGERY  09/23/2011   Prostate Biopsy Dr. Jacqlyn Larsen  . TOTAL HIP ARTHROPLASTY Left 2001  . TOTAL HIP REVISION Left 07/23/2013   Procedure: LEFT TOTAL HIP REVISION ARTHROPLASTY WITH BONE GRAFT;  Surgeon: Mauri Pole, MD;  Location: [facility];  Service: Orthopedics;  Laterality: Left;    Family History  Problem Relation Age of Onset  . Heart attack Neg Hx   . Cancer Neg Hx   . Diabetes Neg Hx   . Heart disease Neg Hx     Social History  Substance Use Topics  .  Smoking status: Former Smoker    Packs/day: 0.50    Years: 30.00    Types: Cigarettes    Quit date: 11/29/2012  . Smokeless tobacco: Never Used  . Alcohol use No    Prior to Admission medications   Medication Sig Start Date End Date Taking? Authorizing Provider  aspirin 81 MG tablet Take 1 tablet by mouth daily. 03/14/07  Yes Historical Provider, MD  ciprofloxacin (CIPRO) 500 MG tablet Take 1 tablet by mouth 2 (two) times  daily. 07/10/16  Yes Historical Provider, MD  methocarbamol (ROBAXIN) 750 MG tablet 1-2 tablets up to 4 times/day if needed for muscle pain and spasm 07/10/16  Yes Historical Provider, MD  omeprazole (PRILOSEC) 20 MG capsule Take 1 capsule by mouth daily. 07/10/16  Yes Historical Provider, MD  quinapril-hydrochlorothiazide (ACCURETIC) 20-12.5 MG tablet TAKE 1 TABLET BY MOUTH DAILY. 05/15/16  Yes Birdie Sons, MD    Current Facility-Administered Medications  Medication Dose Route Frequency Provider Last Rate Last Dose  . 0.9 %  sodium chloride infusion   Intravenous Continuous Fritzi Mandes, MD 75 mL/hr at 07/14/16 1157    . acetaminophen (TYLENOL) tablet 650 mg  650 mg Oral Q6H PRN Gladstone Lighter, MD       Or  . acetaminophen (TYLENOL) suppository 650 mg  650 mg Rectal Q6H PRN Gladstone Lighter, MD      . enoxaparin (LOVENOX) injection 40 mg  40 mg Subcutaneous QHS Gladstone Lighter, MD   40 mg at 07/13/16 2227  . HYDROcodone-acetaminophen (NORCO/VICODIN) 5-325 MG per tablet 1-2 tablet  1-2 tablet Oral Q4H PRN Gladstone Lighter, MD   2 tablet at 07/14/16 1258  . ondansetron (ZOFRAN) tablet 4 mg  4 mg Oral Q6H PRN Gladstone Lighter, MD       Or  . ondansetron (ZOFRAN) injection 4 mg  4 mg Intravenous Q6H PRN Gladstone Lighter, MD      . pantoprazole (PROTONIX) EC tablet 40 mg  40 mg Oral QAC breakfast Gladstone Lighter, MD   40 mg at 07/14/16 0826    Allergies as of 07/13/2016  . (No Known Allergies)     Review of Systems:    All systems reviewed and negative except where noted in HPI.    Physical Exam:  Vital signs in last 24 hours: Temp:  [97.5 F (36.4 C)-98 F (36.7 C)] 98 F (36.7 C) (08/16 0615) Pulse Rate:  [64-80] 64 (08/16 0615) Resp:  [16-20] 20 (08/16 0615) BP: (122-162)/(71-93) 146/71 (08/16 0615) SpO2:  [97 %-100 %] 97 % (08/16 0615) Weight:  [72.1 kg (158 lb 14.4 oz)-74.8 kg (165 lb)] 72.1 kg (158 lb 14.4 oz) (08/15 2151) Last BM Date: 07/09/16 General:   Pleasant  man in NAD Head:  Normocephalic and atraumatic. Eyes:   No icterus.   Conjunctiva pink. Ears:  Normal auditory acuity. Mouth: Mucosa pink moist, no lesions. Neck:  Supple; no masses felt Lungs:  Respirations even and unlabored. Lungs clear to auscultation bilaterally.   No wheezes, crackles, or rhonchi.  Heart:  S1S2, RRR, no MRG. No edema. Abdomen:   Flat, soft, nondistended, nontender. Normal bowel sounds. No appreciable masses or hepatomegaly. No rebound signs or other peritoneal signs. Rectal:  Not performed.  Msk:  MAEW x4, No clubbing or cyanosis. Strength 5/5. Symmetrical without gross deformities. Neurologic:  Alert and  oriented x4;  Cranial nerves II-XII intact.  Skin:  Warm, dry, pink without significant lesions or rashes. Psych:  Alert and cooperative. Normal affect.  LAB RESULTS:  Recent Labs  07/12/16 1510 07/13/16 1335 07/14/16 0405  WBC 9.7 10.2 10.0  HGB  --  17.9 16.1  HCT 51.8* 52.2* 48.2  PLT 260 254 209   BMET  Recent Labs  07/12/16 1510 07/13/16 1335 07/14/16 0405  NA 135 133* 135  K 3.7 3.5 3.4*  CL 88* 93* 98*  CO2 26 31 27   GLUCOSE 105* 115* 99  BUN 8 8 9   CREATININE 1.06 0.90 0.76  CALCIUM 10.3* 10.4* 9.5   LFT  Recent Labs  07/13/16 1335 07/14/16 0405  PROT 8.2*  --   ALBUMIN 4.0  --   AST 203*  --   ALT 323*  --   ALKPHOS 896*  --   BILITOT 6.7*  --   BILIDIR  --  3.7*   PT/INR No results for input(s): LABPROT, INR in the last 72 hours.  STUDIES: Ct Abdomen Pelvis W Contrast  Result Date: 07/13/2016 CLINICAL DATA:  65 year old male with right abdominal pain, most prominent in the right lower quadrant. Elevated liver function tests. Postprandial nausea. History of prostate biopsy. EXAM: CT ABDOMEN AND PELVIS WITH CONTRAST TECHNIQUE: Multidetector CT imaging of the abdomen and pelvis was performed using the standard protocol following bolus administration of intravenous contrast. CONTRAST:  132mL ISOVUE-300 IOPAMIDOL  (ISOVUE-300) INJECTION 61% COMPARISON:  09/29/2012 bone protocol CT pelvis. No prior CT abdomen/pelvis. FINDINGS: Lower chest: No significant pulmonary nodules or acute consolidative airspace disease. Hepatobiliary: Normal liver size. Simple 0.8 cm liver cyst in segment 8 of the right liver lobe. No additional discrete liver lesions. There is slightly irregular annular wall thickening and mucosal hyperenhancement in the fundal gallbladder wall, with loss of the normal fat plane between the fundal gallbladder wall and liver (series 2/ image 32). No radiopaque cholelithiasis. No significant gallbladder distention. No pericholecystic fluid. There is moderate diffuse intrahepatic biliary ductal dilatation. There is a focal stricture in the upper third of the common bile duct with associated common bile duct wall thickening and hyperenhancement (best seen on coronal series 5/ image 46). Pancreas: No pancreatic duct dilation. There is a heterogeneous hypodense 2.1 x 1.5 cm mass at the posterior superior margin of the pancreatic neck (series 2/ image 29), which demonstrates ill-defined margins with infiltrative soft tissue extending into the porta hepatis along the common bile duct. Spleen: Normal size. No mass. Adrenals/Urinary Tract: There is irregular thickening of the bilateral adrenal glands, with the suggestion of small adrenal nodules bilaterally measuring 1.0 cm on the right (series 2/ image 24) and 1.3 cm on the left (series 2/ image 25). No hydronephrosis. Simple 2.0 cm interpolar left renal cyst. Simple 1.0 cm lower right renal cyst. No additional renal lesions. Limited visualization of the collapsed bladder due to streak artifact from left hip hardware with no gross bladder abnormality . Stomach/Bowel: Grossly normal stomach. Normal caliber small bowel with no small bowel wall thickening. Normal appendix . Mild sigmoid diverticulosis, with no large bowel wall thickening or pericolonic fat stranding.  Vascular/Lymphatic: Atherosclerotic nonaneurysmal abdominal aorta. Patent portal, splenic, hepatic and renal veins. No pathologically enlarged lymph nodes in the abdomen or pelvis. Reproductive: Poorly visualized prostate due to streak artifact from the left hip hardware. Prostate appears mildly enlarged. Other: No pneumoperitoneum. No ascites. No focal fluid collection. There is a heterogeneously enhancing 1.5 x 1.4 cm left paracolic gutter peritoneal nodule (series 2/image 44). Musculoskeletal: There is widespread patchy sclerosis throughout the visualized thoracolumbar spine, sacrum, bilateral iliac bones and lower ribs bilaterally. Mild thoracolumbar spondylosis.  Partially visualized left total hip arthroplasty. IMPRESSION: 1. Moderate diffuse intrahepatic biliary ductal dilatation. 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 2. Hypodense 2.1 cm mass at the posterior superior margin of the pancreatic neck with associated infiltrative soft tissue extending into the porta hepatis. No main pancreatic duct dilation. This mass could represent a primary pancreatic adenocarcinoma or malignant peripancreatic lymph node. 3. Irregular annular wall thickening and hyperenhancement in the fundal gallbladder with loss of the normal fat plane between the fundal gallbladder and liver. Cannot exclude gallbladder carcinoma or serosal carcinomatosis in this location. 4. Left paracolic gutter peritoneal nodule suspicious for peritoneal carcinomatosis. 5. Suggestion of small bilateral adrenal nodules, cannot exclude adrenal metastases. 6. MRI of the abdomen with MRCP without and with IV contrast may be useful for further evaluation of the above findings. 7. 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 Recommend correlation with serum PSA. 8. Additional findings include aortic atherosclerosis and mild sigmoid diverticulosis.  Electronically Signed   By: Ilona Sorrel M.D.   On: 07/13/2016 18:21   Mr 3d Recon At Scanner  Result Date: 07/14/2016 CLINICAL DATA:  Common bile duct stricture. EXAM: MRI ABDOMEN WITHOUT AND WITH CONTRAST (INCLUDING MRCP) TECHNIQUE: Multiplanar multisequence MR imaging of the abdomen was performed both before and after the administration of intravenous contrast. Heavily T2-weighted images of the biliary and pancreatic ducts were obtained, and three-dimensional MRCP images were rendered by post processing. CONTRAST:  33mL MULTIHANCE GADOBENATE DIMEGLUMINE 529 MG/ML IV SOLN COMPARISON:  CT 07/13/2016 has high FINDINGS: Lower chest:  Lung bases are clear. Hepatobiliary: There is intrahepatic and extrahepatic biliary duct dilatation. Stricturing of the bile duct at the junction of the common hepatic duct and common bile duct (image 12, series 9). More distal stricturing of the common hepatic duct (image 13, series 9). Both the strictures occur over approximately 1 cm segment. The common bile duct appears normal caliber at the ampullary level. Within the hepatic parenchyma. The bile ducts have a mild beaded appearance. There is suggestion of sludge within the bile ducts of the RIGHT hepatic lobe (image 17, series 3 image 16 series 3). There is a cholesterol gallstone within the fundus of the gallbladder seen on the precontrast T1 weighted imaging (series 14). Pancreas: Along the ventral aspect of the pancreatic head there is a lesion which is either of the pancreas or adjacent the pancreas which is hypointense to normal pancreatic parenchyma measuring 20 mm image 35, series 4. This lesion is hypo enhancing on the arterial phase (image 34, series 15). Favor lesion to be and within the pancreatic parenchyma. There is no significant pancreatic duct dilatation. Spleen: Normal spleen. Adrenals/urinary tract: Thickening and adrenal glands which is incompletely characterized. Stomach/Bowel: Stomach and limited of the  small bowel is unremarkable Vascular/Lymphatic: Abdominal aortic normal caliber. No retroperitoneal periportal lymphadenopathy. Musculoskeletal: Heterogeneous marrow signal within the entirety of the spine (image 19, series 189) para IMPRESSION: 1. Intrahepatic and extrahepatic duct dilatation with segments of bile duct stricturing in the common hepatic duct and common bile duct. Additionally there is mild beaded appearance of the intrahepatic ducts and cholestasis in the RIGHT hepatic lobe. Differential considerations include cholangiocarcinoma of the extrahepatic ducts, occult malignant stricturing of the extrahepatic ducts and primary sclerosing cholangitis with or without associated cholangiocarcinoma. 2. Lesion within or adjacent to the pancreatic head concerning for pancreatic neoplasm versus a malignant lymph node. Recommend endoscopic ultrasound with tissue sampling. Recommend tissue sampling of  the extrahepatic ducts at same time. 3. Cholesterol gallstone within fundus of the gallbladder. 4. 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 Electronically Signed   By: Suzy Bouchard M.D.   On: 07/14/2016 11:21   Mr Jeananne Rama W/wo Cm/mrcp  Result Date: 07/14/2016 CLINICAL DATA:  Common bile duct stricture. EXAM: MRI ABDOMEN WITHOUT AND WITH CONTRAST (INCLUDING MRCP) TECHNIQUE: Multiplanar multisequence MR imaging of the abdomen was performed both before and after the administration of intravenous contrast. Heavily T2-weighted images of the biliary and pancreatic ducts were obtained, and three-dimensional MRCP images were rendered by post processing. CONTRAST:  20mL MULTIHANCE GADOBENATE DIMEGLUMINE 529 MG/ML IV SOLN COMPARISON:  CT 07/13/2016 has high FINDINGS: Lower chest:  Lung bases are clear. Hepatobiliary: There is intrahepatic and extrahepatic biliary duct dilatation. Stricturing of the bile duct at the junction of the common hepatic duct and common bile duct  (image 12, series 9). More distal stricturing of the common hepatic duct (image 13, series 9). Both the strictures occur over approximately 1 cm segment. The common bile duct appears normal caliber at the ampullary level. Within the hepatic parenchyma. The bile ducts have a mild beaded appearance. There is suggestion of sludge within the bile ducts of the RIGHT hepatic lobe (image 17, series 3 image 16 series 3). There is a cholesterol gallstone within the fundus of the gallbladder seen on the precontrast T1 weighted imaging (series 14). Pancreas: Along the ventral aspect of the pancreatic head there is a lesion which is either of the pancreas or adjacent the pancreas which is hypointense to normal pancreatic parenchyma measuring 20 mm image 35, series 4. This lesion is hypo enhancing on the arterial phase (image 34, series 15). Favor lesion to be and within the pancreatic parenchyma. There is no significant pancreatic duct dilatation. Spleen: Normal spleen. Adrenals/urinary tract: Thickening and adrenal glands which is incompletely characterized. Stomach/Bowel: Stomach and limited of the small bowel is unremarkable Vascular/Lymphatic: Abdominal aortic normal caliber. No retroperitoneal periportal lymphadenopathy. Musculoskeletal: Heterogeneous marrow signal within the entirety of the spine (image 19, series 189) para IMPRESSION: 1. Intrahepatic and extrahepatic duct dilatation with segments of bile duct stricturing in the common hepatic duct and common bile duct. Additionally there is mild beaded appearance of the intrahepatic ducts and cholestasis in the RIGHT hepatic lobe. Differential considerations include cholangiocarcinoma of the extrahepatic ducts, occult malignant stricturing of the extrahepatic ducts and primary sclerosing cholangitis with or without associated cholangiocarcinoma. 2. Lesion within or adjacent to the pancreatic head concerning for pancreatic neoplasm versus a malignant lymph node. Recommend  endoscopic ultrasound with tissue sampling. Recommend tissue sampling of the extrahepatic ducts at same time. 3. Cholesterol gallstone within fundus of the gallbladder. 4. 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 Electronically Signed   By: Suzy Bouchard M.D.   On: 07/14/2016 11:21       Impression / Plan:   1. Abnormal hepatobiliary imaging concerning for mestastatic cancer: pancreatic v. Cholangiocarcinoma v. Other. Agree with EUS- also suspect he needs ERCP for possible brushing/stenting & agree with oncology consultation. Given the mentioned concern for [facility], will also assess some further labs and check for any viral hepatitis as well.   Thank you very much for this consult. These services were provided by Stephens November, NP-C, in collaboration with Lollie Sails, MD, with whom I have discussed this patient in full.   Addendum: did discuss with Dr Gustavo Lah: Plan is to d/w Dr Allen Norris for ERCP.  Stephens November, NP-C

## 2016-07-14 NOTE — Plan of Care (Signed)
Problem: Education: Goal: Knowledge of [place] Education information/materials will improve Outcome: Progressing Pts VSS, pain managed, MRCP performed. Pt with no questions or no concerns at this time. Plans to d/c tomorrow. Will continue to monitor.   Problem: Safety: Goal: Ability to remain free from injury will improve Outcome: Progressing No fall or injury at this time.   Problem: Pain Managment: Goal: General experience of comfort will improve Outcome: Progressing Pt receiving PO pain meds for back pain. Pt reporting 100% relief with medication. Wil continue to monitor.   Problem: Physical Regulation: Goal: Will remain free from infection Outcome: Progressing No s/s of infection at this time.   Problem: Skin Integrity: Goal: Risk for impaired skin integrity will decrease Outcome: Progressing Pt free of any skin breakdown. Pt independent with ambulating and repositioning.   Problem: Activity: Goal: Risk for activity intolerance will decrease Outcome: Progressing Pt requiring no assistance with ADLs  Problem: Fluid Volume: Goal: Ability to maintain a balanced intake and output will improve Outcome: Progressing Pt continues to receive IV fluids.   Problem: Nutrition: Goal: Adequate nutrition will be maintained Outcome: Progressing Pt on clear liquid diet and tolerating well at this time. Will continue to monitor.

## 2016-07-14 NOTE — Consult Note (Addendum)
Please see full GI consult by Ms. London. Patient seen and examined chart reviewed. Patient admitted with symptoms of epigastric pain/chronic back pain and decreased appetite. Evaluation shows obstructive jaundice, abnormal liver associated enzymes and dilated intra-and extrahepatic bile ducts, possible pancreatic mass and common bile duct stricture.  We'll discuss further with Dr. Allen Norris in the morning to arrange ERCP. Tumor markers pending.  I did call Dr. Allen Norris this evening. [place], not [place] until next week. We will need to arrange for at least daytime transfer to [facility] for ERCP as clinically feasible.

## 2016-07-14 NOTE — Progress Notes (Signed)
[facility] [place] NAME: Francisco Charles    MR#:  [id number]  DATE OF BIRTH:  [birth year]  SUBJECTIVE:  Came in increasing abdominal pain and not able to keep solids down.   REVIEW OF SYSTEMS:   Review of Systems  Constitutional: Negative for chills, fever and weight loss.  HENT: Negative for ear discharge, ear pain and nosebleeds.   Eyes: Negative for blurred vision, pain and discharge.  Respiratory: Negative for sputum production, shortness of breath, wheezing and stridor.   Cardiovascular: Negative for chest pain, palpitations, orthopnea and PND.  Gastrointestinal: Positive for abdominal pain, nausea and vomiting. Negative for diarrhea.  Genitourinary: Negative for frequency and urgency.  Musculoskeletal: Negative for back pain and joint pain.  Neurological: Positive for weakness. Negative for sensory change, speech change and focal weakness.  Psychiatric/Behavioral: Negative for depression and hallucinations. The patient is not nervous/anxious.   All other systems reviewed and are negative.  Tolerating Diet:not much only liquids Tolerating PT: not needed  DRUG ALLERGIES:  No Known Allergies  VITALS:  Blood pressure (!) 146/71, pulse 64, temperature 98 F (36.7 C), temperature source Oral, resp. rate 20, height 5\' 7"  (1.702 m), weight 158 lb 14.4 oz (72.1 kg), SpO2 97 %.  PHYSICAL EXAMINATION:   Physical Exam  GENERAL:  65 y.o.-year-old patient lying in the bed with no acute distress.  EYES: Pupils equal, round, reactive to light and accommodation. No scleral icterus. Extraocular muscles intact.  HEENT: Head atraumatic, normocephalic. Oropharynx and nasopharynx clear.  NECK:  Supple, no jugular venous distention. No thyroid enlargement, no tenderness.  LUNGS: Normal breath sounds bilaterally, no wheezing, rales, rhonchi. No use of accessory muscles of respiration.  CARDIOVASCULAR: S1, S2 normal. No murmurs, rubs, or  gallops.  ABDOMEN: Soft, nontender, nondistended. Bowel sounds present. No organomegaly or mass.  EXTREMITIES: No cyanosis, clubbing or edema b/l.    NEUROLOGIC: Cranial nerves II through XII are intact. No focal Motor or sensory deficits b/l.   PSYCHIATRIC:  patient is alert and oriented x 3.  SKIN: No obvious rash, lesion, or ulcer.   LABORATORY PANEL:  CBC  Recent Labs Lab 07/14/16 0405  WBC 10.0  HGB 16.1  HCT 48.2  PLT 209    Chemistries   Recent Labs Lab 07/13/16 1335 07/14/16 0405  NA 133* 135  K 3.5 3.4*  CL 93* 98*  CO2 31 27  GLUCOSE 115* 99  BUN 8 9  CREATININE 0.90 0.76  CALCIUM 10.4* 9.5  AST 203*  --   ALT 323*  --   ALKPHOS 896*  --   BILITOT 6.7*  --    Cardiac Enzymes No results for input(s): TROPONINI in the last 168 hours. RADIOLOGY:  Ct Abdomen Pelvis W Contrast  Result Date: 07/13/2016 CLINICAL DATA:  65 year old male with right abdominal pain, most prominent in the right lower quadrant. Elevated liver function tests. Postprandial nausea. History of prostate biopsy. EXAM: CT ABDOMEN AND PELVIS WITH CONTRAST TECHNIQUE: Multidetector CT imaging of the abdomen and pelvis was performed using the standard protocol following bolus administration of intravenous contrast. CONTRAST:  145mL ISOVUE-300 IOPAMIDOL (ISOVUE-300) INJECTION 61% COMPARISON:  09/29/2012 bone protocol CT pelvis. No prior CT abdomen/pelvis. FINDINGS: Lower chest: No significant pulmonary nodules or acute consolidative airspace disease. Hepatobiliary: Normal liver size. Simple 0.8 cm liver cyst in segment 8 of the right liver lobe. No additional discrete liver lesions. There is slightly irregular annular wall thickening and mucosal hyperenhancement in the  fundal gallbladder wall, with loss of the normal fat plane between the fundal gallbladder wall and liver (series 2/ image 32). No radiopaque cholelithiasis. No significant gallbladder distention. No pericholecystic fluid. There is moderate  diffuse intrahepatic biliary ductal dilatation. There is a focal stricture in the upper third of the common bile duct with associated common bile duct wall thickening and hyperenhancement (best seen on coronal series 5/ image 46). Pancreas: No pancreatic duct dilation. There is a heterogeneous hypodense 2.1 x 1.5 cm mass at the posterior superior margin of the pancreatic neck (series 2/ image 29), which demonstrates ill-defined margins with infiltrative soft tissue extending into the porta hepatis along the common bile duct. Spleen: Normal size. No mass. Adrenals/Urinary Tract: There is irregular thickening of the bilateral adrenal glands, with the suggestion of small adrenal nodules bilaterally measuring 1.0 cm on the right (series 2/ image 24) and 1.3 cm on the left (series 2/ image 25). No hydronephrosis. Simple 2.0 cm interpolar left renal cyst. Simple 1.0 cm lower right renal cyst. No additional renal lesions. Limited visualization of the collapsed bladder due to streak artifact from left hip hardware with no gross bladder abnormality . Stomach/Bowel: Grossly normal stomach. Normal caliber small bowel with no small bowel wall thickening. Normal appendix . Mild sigmoid diverticulosis, with no large bowel wall thickening or pericolonic fat stranding. Vascular/Lymphatic: Atherosclerotic nonaneurysmal abdominal aorta. Patent portal, splenic, hepatic and renal veins. No pathologically enlarged lymph nodes in the abdomen or pelvis. Reproductive: Poorly visualized prostate due to streak artifact from the left hip hardware. Prostate appears mildly enlarged. Other: No pneumoperitoneum. No ascites. No focal fluid collection. There is a heterogeneously enhancing 1.5 x 1.4 cm left paracolic gutter peritoneal nodule (series 2/image 44). Musculoskeletal: There is widespread patchy sclerosis throughout the visualized thoracolumbar spine, sacrum, bilateral iliac bones and lower ribs bilaterally. Mild thoracolumbar spondylosis.  Partially visualized left total hip arthroplasty. IMPRESSION: 1. Moderate diffuse intrahepatic biliary ductal dilatation. 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 2. Hypodense 2.1 cm mass at the posterior superior margin of the pancreatic neck with associated infiltrative soft tissue extending into the porta hepatis. No main pancreatic duct dilation. This mass could represent a primary pancreatic adenocarcinoma or malignant peripancreatic lymph node. 3. Irregular annular wall thickening and hyperenhancement in the fundal gallbladder with loss of the normal fat plane between the fundal gallbladder and liver. Cannot exclude gallbladder carcinoma or serosal carcinomatosis in this location. 4. Left paracolic gutter peritoneal nodule suspicious for peritoneal carcinomatosis. 5. Suggestion of small bilateral adrenal nodules, cannot exclude adrenal metastases. 6. MRI of the abdomen with MRCP without and with IV contrast may be useful for further evaluation of the above findings. 7. 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 Recommend correlation with serum PSA. 8. Additional findings include aortic atherosclerosis and mild sigmoid diverticulosis. Electronically Signed   By: Ilona Sorrel M.D.   On: 07/13/2016 18:21   Mr 3d Recon At Scanner  Result Date: 07/14/2016 CLINICAL DATA:  Common bile duct stricture. EXAM: MRI ABDOMEN WITHOUT AND WITH CONTRAST (INCLUDING MRCP) TECHNIQUE: Multiplanar multisequence MR imaging of the abdomen was performed both before and after the administration of intravenous contrast. Heavily T2-weighted images of the biliary and pancreatic ducts were obtained, and three-dimensional MRCP images were rendered by post processing. CONTRAST:  55mL MULTIHANCE GADOBENATE DIMEGLUMINE 529 MG/ML IV SOLN COMPARISON:  CT 07/13/2016 has high FINDINGS: Lower chest:  Lung bases are clear. Hepatobiliary: There  is intrahepatic and extrahepatic biliary duct dilatation. Stricturing of the bile duct at the junction of the common hepatic duct and common bile duct (image 12, series 9). More distal stricturing of the common hepatic duct (image 13, series 9). Both the strictures occur over approximately 1 cm segment. The common bile duct appears normal caliber at the ampullary level. Within the hepatic parenchyma. The bile ducts have a mild beaded appearance. There is suggestion of sludge within the bile ducts of the RIGHT hepatic lobe (image 17, series 3 image 16 series 3). There is a cholesterol gallstone within the fundus of the gallbladder seen on the precontrast T1 weighted imaging (series 14). Pancreas: Along the ventral aspect of the pancreatic head there is a lesion which is either of the pancreas or adjacent the pancreas which is hypointense to normal pancreatic parenchyma measuring 20 mm image 35, series 4. This lesion is hypo enhancing on the arterial phase (image 34, series 15). Favor lesion to be and within the pancreatic parenchyma. There is no significant pancreatic duct dilatation. Spleen: Normal spleen. Adrenals/urinary tract: Thickening and adrenal glands which is incompletely characterized. Stomach/Bowel: Stomach and limited of the small bowel is unremarkable Vascular/Lymphatic: Abdominal aortic normal caliber. No retroperitoneal periportal lymphadenopathy. Musculoskeletal: Heterogeneous marrow signal within the entirety of the spine (image 19, series 189) para IMPRESSION: 1. Intrahepatic and extrahepatic duct dilatation with segments of bile duct stricturing in the common hepatic duct and common bile duct. Additionally there is mild beaded appearance of the intrahepatic ducts and cholestasis in the RIGHT hepatic lobe. Differential considerations include cholangiocarcinoma of the extrahepatic ducts, occult malignant stricturing of the extrahepatic ducts and primary sclerosing cholangitis with or without  associated cholangiocarcinoma. 2. Lesion within or adjacent to the pancreatic head concerning for pancreatic neoplasm versus a malignant lymph node. Recommend endoscopic ultrasound with tissue sampling. Recommend tissue sampling of the extrahepatic ducts at same time. 3. Cholesterol gallstone within fundus of the gallbladder. 4. 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 Electronically Signed   By: Suzy Bouchard M.D.   On: 07/14/2016 11:21   Mr Jeananne Rama W/wo Cm/mrcp  Result Date: 07/14/2016 CLINICAL DATA:  Common bile duct stricture. EXAM: MRI ABDOMEN WITHOUT AND WITH CONTRAST (INCLUDING MRCP) TECHNIQUE: Multiplanar multisequence MR imaging of the abdomen was performed both before and after the administration of intravenous contrast. Heavily T2-weighted images of the biliary and pancreatic ducts were obtained, and three-dimensional MRCP images were rendered by post processing. CONTRAST:  60mL MULTIHANCE GADOBENATE DIMEGLUMINE 529 MG/ML IV SOLN COMPARISON:  CT 07/13/2016 has high FINDINGS: Lower chest:  Lung bases are clear. Hepatobiliary: There is intrahepatic and extrahepatic biliary duct dilatation. Stricturing of the bile duct at the junction of the common hepatic duct and common bile duct (image 12, series 9). More distal stricturing of the common hepatic duct (image 13, series 9). Both the strictures occur over approximately 1 cm segment. The common bile duct appears normal caliber at the ampullary level. Within the hepatic parenchyma. The bile ducts have a mild beaded appearance. There is suggestion of sludge within the bile ducts of the RIGHT hepatic lobe (image 17, series 3 image 16 series 3). There is a cholesterol gallstone within the fundus of the gallbladder seen on the precontrast T1 weighted imaging (series 14). Pancreas: Along the ventral aspect of the pancreatic head there is a lesion which is either of the pancreas or adjacent the pancreas which is  hypointense to normal pancreatic parenchyma measuring 20 mm image 35,  series 4. This lesion is hypo enhancing on the arterial phase (image 34, series 15). Favor lesion to be and within the pancreatic parenchyma. There is no significant pancreatic duct dilatation. Spleen: Normal spleen. Adrenals/urinary tract: Thickening and adrenal glands which is incompletely characterized. Stomach/Bowel: Stomach and limited of the small bowel is unremarkable Vascular/Lymphatic: Abdominal aortic normal caliber. No retroperitoneal periportal lymphadenopathy. Musculoskeletal: Heterogeneous marrow signal within the entirety of the spine (image 19, series 189) para IMPRESSION: 1. Intrahepatic and extrahepatic duct dilatation with segments of bile duct stricturing in the common hepatic duct and common bile duct. Additionally there is mild beaded appearance of the intrahepatic ducts and cholestasis in the RIGHT hepatic lobe. Differential considerations include cholangiocarcinoma of the extrahepatic ducts, occult malignant stricturing of the extrahepatic ducts and primary sclerosing cholangitis with or without associated cholangiocarcinoma. 2. Lesion within or adjacent to the pancreatic head concerning for pancreatic neoplasm versus a malignant lymph node. Recommend endoscopic ultrasound with tissue sampling. Recommend tissue sampling of the extrahepatic ducts at same time. 3. Cholesterol gallstone within fundus of the gallbladder. 4. 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 Electronically Signed   By: Suzy Bouchard M.D.   On: 07/14/2016 11:21   ASSESSMENT AND PLAN:   Francisco Charles  is a 65 y.o. male with a known history of  Hypertension, arthritis presents to hospital secondary to worsening abdominal pain and inability to eat solid foods for almost a week.  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 Case d/w dr Gustavo Lah - oncology consult- spoke with DR Patrecia Pace - tumor markers ordered -CLD - pain control, IV fluids  #2 Jaundice- likely from above Direct bilirubin elevated due to malignant looking CBD stricture  #3 GERD- protonix  #4 HTN- hold meds  #5 DVT prophylaxis- lovenox- hold tomorrow dose  #6 Tobacco abuse advised smoking cessation. About 4 mins spent.  Case discussed with Care Management/Social Worker. Management plans discussed with the patient, family and they are in agreement.  CODE STATUS: full  DVT Prophylaxis: lovenox TOTAL TIME TAKING CARE OF THIS PATIENT: 40 minutes.  >50% time spent on counselling and coordination of care  POSSIBLE D/C IN 2-3 DAYS, DEPENDING ON CLINICAL CONDITION. Pending GI w/u  Note: This dictation was prepared with Dragon dictation along with smaller phrase technology. Any transcriptional errors that result from this process are unintentional.  Daron Breeding M.D on 07/14/2016 at 11:58 AM  Between 7am to 6pm - Pager - [phone]  After 6pm go to [email or web address] - password [email or web address] [facility]  Office  [phone]  CC: Primary care physician; Lelon Huh, MD

## 2016-07-14 NOTE — Progress Notes (Signed)
Initial Nutrition Assessment  DOCUMENTATION CODES:   Severe malnutrition in context of acute illness/injury  INTERVENTION:  -Monitor intake and diet progression.  -Recommend boost breeze TID for added nutrition.  Pt reports milky liquids (ie milk) causes him to gag.     NUTRITION DIAGNOSIS:   Malnutrition related to altered GI function as evidenced by percent weight loss, energy intake < or equal to 50% for > or equal to 5 days.    GOAL:   Patient will meet greater than or equal to 90% of their needs    MONITOR:   PO intake, Diet advancement, Supplement acceptance  REASON FOR ASSESSMENT:   Malnutrition Screening Tool, Consult Assessment of nutrition requirement/status  ASSESSMENT:      Pt admitted with abdominal pain, pancreatic mass found with CBD malignant stricture  Past Medical History:  Diagnosis Date  . Carpal tunnel syndrome   . History of measles   . Hyperplastic colonic polyp 2006   Excised Dr. Tiffany Kocher  . Hypertension   . Osteoarthrosis   . Prostate nodule    Pt reports has been unable to tolerate solid foods for the past week.  Has been eating mainly soups (chicken noodle, tomato and potato), no solid foods as they would come back up.  Tolerated broth, jello, juice today for lunch.    Medications reviewed: protonix, NS at 37ml/hr Labs reviewed: K 3.4  Nutrition-Focused physical exam completed. Findings are no fat depletion, mild to normal muscle depletion, and no edema.    Diet Order:  Diet clear liquid Room service appropriate? Yes; Fluid consistency: Thin  Skin:  Reviewed, no issues  Last BM:  8/11  Height:   Ht Readings from Last 1 Encounters:  07/13/16 5\' 7"  (1.702 m)    Weight: 3% wt loss in the last week  Wt Readings from Last 1 Encounters:  07/13/16 158 lb 14.4 oz (72.1 kg)    Ideal Body Weight:     BMI:  Body mass index is 24.89 kg/m.  Estimated Nutritional Needs:   Kcal:  2160-2520 kcals/d  Protein:  86-108  g/d  Fluid:  >/= 2L/d  EDUCATION NEEDS:   Education needs addressed  Kennieth Plotts B. Zenia Resides, [place], [place] (pager) Weekend/On-Call pager [phone])

## 2016-07-14 NOTE — Telephone Encounter (Signed)
Pt called to cancel the ultra sound scheduled for Thursday.  Pt states he is at [facility] in the ER right now.  CB#[phone]

## 2016-07-15 ENCOUNTER — Ambulatory Visit: Payer: No Typology Code available for payment source

## 2016-07-15 ENCOUNTER — Inpatient Hospital Stay (HOSPITAL_COMMUNITY)
Admission: AD | Admit: 2016-07-15 | Discharge: 2016-07-20 | Disposition: A | Discharge status: Home / Self Care | Payer: No Typology Code available for payment source | Source: Other Acute Inpatient Hospital | Attending: Internal Medicine | Admitting: Internal Medicine

## 2016-07-15 ENCOUNTER — Telehealth: Payer: Self-pay

## 2016-07-15 ENCOUNTER — Encounter (HOSPITAL_COMMUNITY): Payer: Self-pay | Admitting: Internal Medicine

## 2016-07-15 DIAGNOSIS — N390 Urinary tract infection, site not specified: Secondary | ICD-10-CM

## 2016-07-15 DIAGNOSIS — K831 Obstruction of bile duct: Secondary | ICD-10-CM

## 2016-07-15 DIAGNOSIS — F17210 Nicotine dependence, cigarettes, uncomplicated: Secondary | ICD-10-CM | POA: Diagnosis present

## 2016-07-15 DIAGNOSIS — K869 Disease of pancreas, unspecified: Secondary | ICD-10-CM | POA: Diagnosis present

## 2016-07-15 DIAGNOSIS — Z79899 Other long term (current) drug therapy: Secondary | ICD-10-CM

## 2016-07-15 DIAGNOSIS — C23 Malignant neoplasm of gallbladder: Secondary | ICD-10-CM | POA: Diagnosis present

## 2016-07-15 DIAGNOSIS — I10 Essential (primary) hypertension: Secondary | ICD-10-CM

## 2016-07-15 DIAGNOSIS — Z87891 Personal history of nicotine dependence: Secondary | ICD-10-CM

## 2016-07-15 DIAGNOSIS — E43 Unspecified severe protein-calorie malnutrition: Secondary | ICD-10-CM | POA: Diagnosis present

## 2016-07-15 DIAGNOSIS — K8689 Other specified diseases of pancreas: Secondary | ICD-10-CM | POA: Diagnosis present

## 2016-07-15 LAB — AFP TUMOR MARKER: AFP-Tumor Marker: 7.5 ng/mL (ref 0.0–8.3)

## 2016-07-15 LAB — HEPATITIS C ANTIBODY (REFLEX)

## 2016-07-15 LAB — BASIC METABOLIC PANEL
ANION GAP: 9 (ref 5–15)
BUN: 10 mg/dL (ref 6–20)
CO2: 26 mmol/L (ref 22–32)
Calcium: 9.3 mg/dL (ref 8.9–10.3)
Chloride: 101 mmol/L (ref 101–111)
Creatinine, Ser: 0.46 mg/dL — ABNORMAL LOW (ref 0.61–1.24)
GFR calc Af Amer: >60 mL/min (ref >60)
GFR calc non Af Amer: >60 mL/min (ref >60)
GLUCOSE: 133 mg/dL — AB (ref 65–99)
POTASSIUM: 3.1 mmol/L — AB (ref 3.5–5.1)
Sodium: 136 mmol/L (ref 135–145)

## 2016-07-15 LAB — CBC
HCT: 43 % (ref 39.0–52.0)
Hemoglobin: 15 g/dL (ref 13.0–17.0)
MCH: 30.4 pg (ref 26.0–34.0)
MCHC: 34.9 g/dL (ref 30.0–36.0)
MCV: 87.2 fL (ref 78.0–100.0)
Platelets: 235 10*3/uL (ref 150–400)
RBC: 4.93 MIL/uL (ref 4.22–5.81)
RDW: 14 % (ref 11.5–15.5)
WBC: 10.3 10*3/uL (ref 4.0–10.5)

## 2016-07-15 LAB — CEA: CEA: 7.2 ng/mL — AB (ref 0.0–4.7)

## 2016-07-15 LAB — HCV COMMENT:

## 2016-07-15 LAB — CA 19-9 (SERIAL): CA 19 9: 289 U/mL — AB (ref 0–35)

## 2016-07-15 LAB — HEPATITIS A ANTIBODY, TOTAL: Hep A Total Ab: POSITIVE — AB

## 2016-07-15 LAB — HEPATITIS B SURFACE ANTIGEN: Hepatitis B Surface Ag: NEGATIVE

## 2016-07-15 LAB — MITOCHONDRIAL ANTIBODIES: Mitochondrial M2 Ab, IgG: 5.2 U (ref 0.0–20.0)

## 2016-07-15 LAB — HEPATITIS B SURFACE ANTIBODY, QUANTITATIVE: Hepatitis B-Post: <3.1 m[IU]/mL — ABNORMAL LOW (ref >9.9)

## 2016-07-15 LAB — HEPATITIS B CORE ANTIBODY, TOTAL: Hep B Core Total Ab: NEGATIVE

## 2016-07-15 LAB — ANTI-SMOOTH MUSCLE ANTIBODY, IGG: F-Actin IgG: 10 Units (ref 0–19)

## 2016-07-15 LAB — ANTINUCLEAR ANTIBODIES, IFA: ANA Ab, IFA: NEGATIVE

## 2016-07-15 MED ORDER — BOOST / RESOURCE BREEZE PO LIQD: 1.0000 | Freq: Three times a day (TID) | ORAL | 0 refills | Status: AC

## 2016-07-15 MED ORDER — HEPARIN SODIUM (PORCINE) 5000 UNIT/ML IJ SOLN
5000.0000 [IU] | Freq: Three times a day (TID) | INTRAMUSCULAR | Status: DC
  Administered 2016-07-15 – 2016-07-17 (×6): 5000 [IU] via SUBCUTANEOUS
  Filled 2016-07-15 (×4): qty 1

## 2016-07-15 MED ORDER — ENSURE ENLIVE PO LIQD: 237.0000 mL | Freq: Two times a day (BID) | ORAL | Status: DC

## 2016-07-15 MED ORDER — TRAMADOL HCL 50 MG PO TABS
50.0000 mg | ORAL_TABLET | Freq: Four times a day (QID) | ORAL | Status: DC | PRN
  Administered 2016-07-15 (×2): 50 mg via ORAL
  Filled 2016-07-15 (×2): qty 1

## 2016-07-15 MED ORDER — POLYETHYLENE GLYCOL 3350 17 G PO PACK: 17.0000 g | PACK | Freq: Every day | ORAL | Status: DC | PRN

## 2016-07-15 MED ORDER — BOOST / RESOURCE BREEZE PO LIQD
1.0000 | Freq: Three times a day (TID) | ORAL | Status: DC
  Administered 2016-07-15 – 2016-07-19 (×3): 1 via ORAL

## 2016-07-15 MED ORDER — KETOROLAC TROMETHAMINE 15 MG/ML IJ SOLN
10.0000 mg | Freq: Once | INTRAMUSCULAR | Status: AC
  Administered 2016-07-15: 10 mg via INTRAVENOUS
  Filled 2016-07-15: qty 1

## 2016-07-15 MED ORDER — KCL IN DEXTROSE-NACL 40-5-0.45 MEQ/L-%-% IV SOLN
INTRAVENOUS | Status: DC
  Administered 2016-07-15 – 2016-07-18 (×2): via INTRAVENOUS
  Filled 2016-07-15 (×5): qty 1000

## 2016-07-15 NOTE — Consult Note (Addendum)
Reason for Consult: Obstructive jaundice and pancreatic neck mass Referring Physician: [facility]  Harless Nakayama HPI: This is a 65 year old male with a PMH of HTN and arthritis transferred from [facility] for further evaluation and treatment of an obstructive jaundice.  Recently the patient complained about RUQ pain and anorexia.  One week ago he was having difficulty with tolerating PO and abdominal pain, mostly in the right upper quadrant.  He was thought to have an UTI and emperic treatment with antibiotics were pursued, however, his symptoms did not improve.  He subsequently presented to [facility] ER and a CT scan was performed.  He was identified to have a 2 cm lesion in the superior margin of the pancreatic neck with some soft tissue extension into the porta hepatis.  There is a focal stricture in the upper third of the CBD and diffuse intrahepatic biliary ductal dilation.  Additionally, there is a loss of the fat plane in the gallbladder fossa and a gallbladder cancer cannot be excluded.  He was admitted to [facility] for further treatment, but Dr. Allen Norris was out of town until next week.  Past Medical History:  Diagnosis Date  . Carpal tunnel syndrome   . History of measles   . Hyperplastic colonic polyp 2006   Excised Dr. Tiffany Kocher  . Hypertension   . Osteoarthrosis   . Prostate nodule     Past Surgical History:  Procedure Laterality Date  . JOINT REPLACEMENT Left 10-31-2000  . PROSTATE SURGERY  09/23/2011   Prostate Biopsy Dr. Jacqlyn Larsen  . TOTAL HIP ARTHROPLASTY Left 2001  . TOTAL HIP REVISION Left 07/23/2013   Procedure: LEFT TOTAL HIP REVISION ARTHROPLASTY WITH BONE GRAFT;  Surgeon: Mauri Pole, MD;  Location: [facility] ORS;  Service: Orthopedics;  Laterality: Left;    Family History  Problem Relation Age of Onset  . Heart attack Mother   . Cancer Neg Hx   . Diabetes Neg Hx   . Heart disease Neg Hx     Social History:  reports that he has been smoking Cigarettes.  He has  a 15.00 pack-year smoking history. He has never used smokeless tobacco. He reports that he does not drink alcohol or use drugs.  Allergies: No Known Allergies  Medications:  Scheduled: . feeding supplement  1 Container Oral TID BM  . heparin  5,000 Units Subcutaneous Q8H   Continuous: . dextrose 5 % and 0.45 % NaCl with KCl 40 mEq/L      No results found for this or any previous visit (from the past 24 hour(s)).   Ct Abdomen Pelvis W Contrast  Result Date: 07/13/2016 CLINICAL DATA:  65 year old male with right abdominal pain, most prominent in the right lower quadrant. Elevated liver function tests. Postprandial nausea. History of prostate biopsy. EXAM: CT ABDOMEN AND PELVIS WITH CONTRAST TECHNIQUE: Multidetector CT imaging of the abdomen and pelvis was performed using the standard protocol following bolus administration of intravenous contrast. CONTRAST:  175mL ISOVUE-300 IOPAMIDOL (ISOVUE-300) INJECTION 61% COMPARISON:  09/29/2012 bone protocol CT pelvis. No prior CT abdomen/pelvis. FINDINGS: Lower chest: No significant pulmonary nodules or acute consolidative airspace disease. Hepatobiliary: Normal liver size. Simple 0.8 cm liver cyst in segment 8 of the right liver lobe. No additional discrete liver lesions. There is slightly irregular annular wall thickening and mucosal hyperenhancement in the fundal gallbladder wall, with loss of the normal fat plane between the fundal gallbladder wall and liver (series 2/ image 32). No radiopaque cholelithiasis. No significant gallbladder  distention. No pericholecystic fluid. There is moderate diffuse intrahepatic biliary ductal dilatation. There is a focal stricture in the upper third of the common bile duct with associated common bile duct wall thickening and hyperenhancement (best seen on coronal series 5/ image 46). Pancreas: No pancreatic duct dilation. There is a heterogeneous hypodense 2.1 x 1.5 cm mass at the posterior superior margin of the  pancreatic neck (series 2/ image 29), which demonstrates ill-defined margins with infiltrative soft tissue extending into the porta hepatis along the common bile duct. Spleen: Normal size. No mass. Adrenals/Urinary Tract: There is irregular thickening of the bilateral adrenal glands, with the suggestion of small adrenal nodules bilaterally measuring 1.0 cm on the right (series 2/ image 24) and 1.3 cm on the left (series 2/ image 25). No hydronephrosis. Simple 2.0 cm interpolar left renal cyst. Simple 1.0 cm lower right renal cyst. No additional renal lesions. Limited visualization of the collapsed bladder due to streak artifact from left hip hardware with no gross bladder abnormality . Stomach/Bowel: Grossly normal stomach. Normal caliber small bowel with no small bowel wall thickening. Normal appendix . Mild sigmoid diverticulosis, with no large bowel wall thickening or pericolonic fat stranding. Vascular/Lymphatic: Atherosclerotic nonaneurysmal abdominal aorta. Patent portal, splenic, hepatic and renal veins. No pathologically enlarged lymph nodes in the abdomen or pelvis. Reproductive: Poorly visualized prostate due to streak artifact from the left hip hardware. Prostate appears mildly enlarged. Other: No pneumoperitoneum. No ascites. No focal fluid collection. There is a heterogeneously enhancing 1.5 x 1.4 cm left paracolic gutter peritoneal nodule (series 2/image 44). Musculoskeletal: There is widespread patchy sclerosis throughout the visualized thoracolumbar spine, sacrum, bilateral iliac bones and lower ribs bilaterally. Mild thoracolumbar spondylosis. Partially visualized left total hip arthroplasty. IMPRESSION: 1. Moderate diffuse intrahepatic biliary ductal dilatation. 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 2. Hypodense 2.1 cm mass at the posterior superior margin of the pancreatic neck with associated infiltrative soft tissue extending into the porta hepatis. No main  pancreatic duct dilation. This mass could represent a primary pancreatic adenocarcinoma or malignant peripancreatic lymph node. 3. Irregular annular wall thickening and hyperenhancement in the fundal gallbladder with loss of the normal fat plane between the fundal gallbladder and liver. Cannot exclude gallbladder carcinoma or serosal carcinomatosis in this location. 4. Left paracolic gutter peritoneal nodule suspicious for peritoneal carcinomatosis. 5. Suggestion of small bilateral adrenal nodules, cannot exclude adrenal metastases. 6. MRI of the abdomen with MRCP without and with IV contrast may be useful for further evaluation of the above findings. 7. 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 Recommend correlation with serum PSA. 8. Additional findings include aortic atherosclerosis and mild sigmoid diverticulosis. Electronically Signed   By: Ilona Sorrel M.D.   On: 07/13/2016 18:21   Mr 3d Recon At Scanner  Result Date: 07/14/2016 CLINICAL DATA:  Common bile duct stricture. EXAM: MRI ABDOMEN WITHOUT AND WITH CONTRAST (INCLUDING MRCP) TECHNIQUE: Multiplanar multisequence MR imaging of the abdomen was performed both before and after the administration of intravenous contrast. Heavily T2-weighted images of the biliary and pancreatic ducts were obtained, and three-dimensional MRCP images were rendered by post processing. CONTRAST:  40mL MULTIHANCE GADOBENATE DIMEGLUMINE 529 MG/ML IV SOLN COMPARISON:  CT 07/13/2016 has high FINDINGS: Lower chest:  Lung bases are clear. Hepatobiliary: There is intrahepatic and extrahepatic biliary duct dilatation. Stricturing of the bile duct at the junction of the common hepatic duct and common bile duct (image 12, series 9).  More distal stricturing of the common hepatic duct (image 13, series 9). Both the strictures occur over approximately 1 cm segment. The common bile duct appears normal caliber at the  ampullary level. Within the hepatic parenchyma. The bile ducts have a mild beaded appearance. There is suggestion of sludge within the bile ducts of the RIGHT hepatic lobe (image 17, series 3 image 16 series 3). There is a cholesterol gallstone within the fundus of the gallbladder seen on the precontrast T1 weighted imaging (series 14). Pancreas: Along the ventral aspect of the pancreatic head there is a lesion which is either of the pancreas or adjacent the pancreas which is hypointense to normal pancreatic parenchyma measuring 20 mm image 35, series 4. This lesion is hypo enhancing on the arterial phase (image 34, series 15). Favor lesion to be and within the pancreatic parenchyma. There is no significant pancreatic duct dilatation. Spleen: Normal spleen. Adrenals/urinary tract: Thickening and adrenal glands which is incompletely characterized. Stomach/Bowel: Stomach and limited of the small bowel is unremarkable Vascular/Lymphatic: Abdominal aortic normal caliber. No retroperitoneal periportal lymphadenopathy. Musculoskeletal: Heterogeneous marrow signal within the entirety of the spine (image 19, series 189) para IMPRESSION: 1. Intrahepatic and extrahepatic duct dilatation with segments of bile duct stricturing in the common hepatic duct and common bile duct. Additionally there is mild beaded appearance of the intrahepatic ducts and cholestasis in the RIGHT hepatic lobe. Differential considerations include cholangiocarcinoma of the extrahepatic ducts, occult malignant stricturing of the extrahepatic ducts and primary sclerosing cholangitis with or without associated cholangiocarcinoma. 2. Lesion within or adjacent to the pancreatic head concerning for pancreatic neoplasm versus a malignant lymph node. Recommend endoscopic ultrasound with tissue sampling. Recommend tissue sampling of the extrahepatic ducts at same time. 3. Cholesterol gallstone within fundus of the gallbladder. 4. Heterogeneous marrow signal  throughout the spine is concerning for skeletal metastasis. Electronically Signed   By: Suzy Bouchard M.D.   On: 07/14/2016 11:21   Mr Jeananne Rama W/wo Cm/mrcp  Result Date: 07/14/2016 CLINICAL DATA:  Common bile duct stricture. EXAM: MRI ABDOMEN WITHOUT AND WITH CONTRAST (INCLUDING MRCP) TECHNIQUE: Multiplanar multisequence MR imaging of the abdomen was performed both before and after the administration of intravenous contrast. Heavily T2-weighted images of the biliary and pancreatic ducts were obtained, and three-dimensional MRCP images were rendered by post processing. CONTRAST:  43mL MULTIHANCE GADOBENATE DIMEGLUMINE 529 MG/ML IV SOLN COMPARISON:  CT 07/13/2016 has high FINDINGS: Lower chest:  Lung bases are clear. Hepatobiliary: There is intrahepatic and extrahepatic biliary duct dilatation. Stricturing of the bile duct at the junction of the common hepatic duct and common bile duct (image 12, series 9). More distal stricturing of the common hepatic duct (image 13, series 9). Both the strictures occur over approximately 1 cm segment. The common bile duct appears normal caliber at the ampullary level. Within the hepatic parenchyma. The bile ducts have a mild beaded appearance. There is suggestion of sludge within the bile ducts of the RIGHT hepatic lobe (image 17, series 3 image 16 series 3). There is a cholesterol gallstone within the fundus of the gallbladder seen on the precontrast T1 weighted imaging (series 14). Pancreas: Along the ventral aspect of the pancreatic head there is a lesion which is either of the pancreas or adjacent the pancreas which is hypointense to normal pancreatic parenchyma measuring 20 mm image 35, series 4. This lesion is hypo enhancing on the arterial phase (image 34, series 15). Favor lesion to be and within the pancreatic parenchyma. There is no significant  pancreatic duct dilatation. Spleen: Normal spleen. Adrenals/urinary tract: Thickening and adrenal glands which is incompletely  characterized. Stomach/Bowel: Stomach and limited of the small bowel is unremarkable Vascular/Lymphatic: Abdominal aortic normal caliber. No retroperitoneal periportal lymphadenopathy. Musculoskeletal: Heterogeneous marrow signal within the entirety of the spine (image 19, series 189) para IMPRESSION: 1. Intrahepatic and extrahepatic duct dilatation with segments of bile duct stricturing in the common hepatic duct and common bile duct. Additionally there is mild beaded appearance of the intrahepatic ducts and cholestasis in the RIGHT hepatic lobe. Differential considerations include cholangiocarcinoma of the extrahepatic ducts, occult malignant stricturing of the extrahepatic ducts and primary sclerosing cholangitis with or without associated cholangiocarcinoma. 2. Lesion within or adjacent to the pancreatic head concerning for pancreatic neoplasm versus a malignant lymph node. Recommend endoscopic ultrasound with tissue sampling. Recommend tissue sampling of the extrahepatic ducts at same time. 3. Cholesterol gallstone within fundus of the gallbladder. 4. 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 Electronically Signed   By: Suzy Bouchard M.D.   On: 07/14/2016 11:21    ROS:  As stated above in the HPI otherwise negative.  Blood pressure (!) 176/88, pulse 60, temperature 98 F (36.7 C), temperature source Oral, resp. rate 18, SpO2 100 %.    PE: Gen: NAD, Alert and Oriented HEENT:  /AT, EOMI Neck: Supple, no LAD Lungs: CTA Bilaterally CV: RRR without M/G/R ABM: Soft, mild abdominal pain, +BS Ext: No C/C/E  Assessment/Plan: 1) Pancreatic neck mass. 2) ? Gallbladder cancer. 3) Obstructive jaundice.   With the current findings further evaluation and treatment with an ERCP with stenting is required.  Once the stenting is completed he will require an EUS with FNA as an outpatient for further evaluation.  He is noted to have some possible peritoneal  spread.  Plan: 1) ERCP with stenting tomorrow. 2) EUS with FNA in the near future. 3) Check Ca 19-9.  Kennede Lusk D 07/15/2016, 3:40 PM

## 2016-07-15 NOTE — Progress Notes (Addendum)
Pt transported at this time to [facility] per [facility].  No c/o or distress. Report  Called to diane rn at [facility] . Pt to go to  rm [id number] at [facility]

## 2016-07-15 NOTE — H&P (Signed)
[facility] History and Physical  Francisco Charles [id number] DOB: Nov 27, 1951 DOA: 07/15/2016  Referring physician: Dr. Posey Charles PCP: Francisco Huh, MD   Chief Complaint: nausea and abd pain  HPI: Francisco Charles is a 65 y.o. male past medical history of hypertension and arthritis comes into the hospital for several days of worsening abdominal pain, he also relates inability eat solids for almost 2 days. He relates nothing makes it better worst, his pain is in his right upper quadrant radiates to his back no vomiting or nauseated. He was seen by his primary care doctor who treated him empirically with antibiotics for UTI. 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 Dr. Sonny Charles has been notified and has scheduled the patient for ERCP on 07/16/2016.    Review of Systems:  Constitutional:  No weight loss, night sweats, Fevers, chills, fatigue.  HEENT:  No headaches, Difficulty swallowing,Tooth/dental problems,Sore throat,  No sneezing, itching, ear ache, nasal congestion, post nasal drip,  Cardio-vascular:  No chest pain, Orthopnea, PND, swelling in lower extremities, anasarca, dizziness, palpitations  GI:  No heartburn, indigestion, diarrhea, change in bowel habits, loss of appetite  Resp:  No shortness of breath with exertion or at rest. No excess mucus, no productive cough, No non-productive cough, No coughing up of blood.No change in color of mucus.No wheezing.No chest wall deformity  Skin:  no rash or lesions.  GU:  no dysuria, change in color of urine, no urgency or frequency. No flank pain.  Musculoskeletal:  No joint pain or swelling. No decreased range of motion. No back pain.  Psych:  No change in mood or affect.  No depression or anxiety. No memory loss.   Past Medical History:  Diagnosis Date  . Carpal tunnel syndrome   . History of measles   . Hyperplastic colonic polyp 2006   Excised Dr. Tiffany Charles  . Hypertension   . Osteoarthrosis   . Prostate nodule    Past Surgical History:  Procedure Laterality Date  . JOINT REPLACEMENT Left 10-31-2000  . PROSTATE SURGERY  09/23/2011   Prostate Biopsy Francisco Charles  . TOTAL HIP ARTHROPLASTY Left 2001  . TOTAL HIP REVISION Left 07/23/2013   Procedure: LEFT TOTAL HIP REVISION ARTHROPLASTY WITH BONE GRAFT;  Surgeon: Francisco Pole, MD;  Location: [facility];  Service: Orthopedics;  Laterality: Left;   Social History:  reports that he has been smoking Cigarettes.  He has a 15.00 pack-year smoking history. He has never used smokeless tobacco. He reports that he does not drink alcohol or use drugs.  No Known Allergies  Family History  Problem Relation Age of Onset  . Heart attack Mother   . Cancer Neg Hx   . Diabetes Neg Hx   . Heart disease Neg Hx     Prior to Admission medications   Medication Sig Start Date End Date Taking? Authorizing Provider  feeding supplement (BOOST / RESOURCE BREEZE) LIQD Take 1 Container by mouth 3 (three) times daily between meals. 07/15/16   Francisco Mandes, MD  methocarbamol (ROBAXIN) 750 MG tablet 1-2 tablets up to 4 times/day if needed for muscle pain and spasm 07/10/16   Historical Provider, MD  omeprazole (PRILOSEC) 20 MG capsule Take 1 capsule by mouth daily.  07/10/16   Historical Provider, MD  quinapril-hydrochlorothiazide (ACCURETIC) 20-12.5 MG tablet TAKE 1 TABLET BY MOUTH DAILY. 05/15/16   Francisco Sons, MD   Physical Exam: There were no vitals filed for this visit.  Wt Readings from Last 3 Encounters:  07/13/16 72.1 kg (158 lb 14.4 oz)  07/12/16 73.9 kg (163 lb)  07/25/15 79.8 kg (176 lb)    General:  Appears calm and comfortable Eyes: PERRL, normal lids, irises & conjunctiva ENT: grossly normal hearing, lips &  tongue Neck: no LAD, masses or thyromegaly Cardiovascular: RRR, no m/r/g. No LE edema. Telemetry: SR, no arrhythmias  Respiratory: CTA bilaterally, no w/r/r. Normal respiratory effort. Abdomen: soft, ntnd Skin: no rash or induration seen on limited exam Musculoskeletal: grossly normal tone BUE/BLE Psychiatric: grossly normal mood and affect, speech fluent and appropriate Neurologic: grossly non-focal.          Labs on Admission:  Basic Metabolic Panel:  Recent Labs Lab 07/12/16 1510 07/13/16 1335 07/14/16 0405  NA 135 133* 135  K 3.7 3.5 3.4*  CL 88* 93* 98*  CO2 26 31 27   GLUCOSE 105* 115* 99  BUN 8 8 9   CREATININE 1.06 0.90 0.76  CALCIUM 10.3* 10.4* 9.5   Liver Function Tests:  Recent Labs Lab 07/12/16 1510 07/13/16 1335  AST 246* 203*  ALT 351* 323*  ALKPHOS 921* 896*  BILITOT 5.7* 6.7*  PROT 7.7 8.2*  ALBUMIN 4.4 4.0    Recent Labs Lab 07/13/16 1335  LIPASE 26   No results for input(s): AMMONIA in the last 168 hours. CBC:  Recent Labs Lab 07/12/16 1510 07/13/16 1335 07/14/16 0405  WBC 9.7 10.2 10.0  HGB  --  17.9 16.1  HCT 51.8* 52.2* 48.2  MCV 93 89.7 90.7  PLT 260 254 209   Cardiac Enzymes: No results for input(s): CKTOTAL, CKMB, CKMBINDEX, TROPONINI in the last 168 hours.  BNP (last 3 results) No results for input(s): BNP in the last 8760 hours.  ProBNP (last 3 results) No results for input(s): PROBNP in the last 8760 hours.  CBG: No results for input(s): GLUCAP in the last 168 hours.  Radiological Exams on Admission: Ct Abdomen Pelvis W Contrast  Result Date: 07/13/2016 CLINICAL DATA:  65 year old male with right abdominal pain, most prominent in the right lower quadrant. Elevated liver function tests. Postprandial nausea. History of prostate biopsy. EXAM: CT ABDOMEN AND PELVIS WITH CONTRAST TECHNIQUE: Multidetector CT imaging of the abdomen and pelvis was performed using the standard protocol following bolus administration of  intravenous contrast. CONTRAST:  198mL ISOVUE-300 IOPAMIDOL (ISOVUE-300) INJECTION 61% COMPARISON:  09/29/2012 bone protocol CT pelvis. No prior CT abdomen/pelvis. FINDINGS: Lower chest: No significant pulmonary nodules or acute consolidative airspace disease. Hepatobiliary: Normal liver size. Simple 0.8 cm liver cyst in segment 8 of the right liver lobe. No additional discrete liver lesions. There is slightly irregular annular wall thickening and mucosal hyperenhancement in the fundal gallbladder wall, with loss of the normal fat plane between the fundal gallbladder wall and liver (series 2/ image 32). No radiopaque cholelithiasis. No significant gallbladder distention. No pericholecystic fluid. There is moderate diffuse intrahepatic biliary ductal dilatation. There is a focal stricture in the upper third of the common bile duct with associated common bile duct wall thickening and hyperenhancement (best seen on coronal series 5/ image 46). Pancreas: No pancreatic duct dilation. There is a heterogeneous hypodense 2.1 x 1.5 cm mass at the posterior superior margin of the pancreatic neck (series 2/ image 29), which  demonstrates ill-defined margins with infiltrative soft tissue extending into the porta hepatis along the common bile duct. Spleen: Normal size. No mass. Adrenals/Urinary Tract: There is irregular thickening of the bilateral adrenal glands, with the suggestion of small adrenal nodules bilaterally measuring 1.0 cm on the right (series 2/ image 24) and 1.3 cm on the left (series 2/ image 25). No hydronephrosis. Simple 2.0 cm interpolar left renal cyst. Simple 1.0 cm lower right renal cyst. No additional renal lesions. Limited visualization of the collapsed bladder due to streak artifact from left hip hardware with no gross bladder abnormality . Stomach/Bowel: Grossly normal stomach. Normal caliber small bowel with no small bowel wall thickening. Normal appendix . Mild sigmoid diverticulosis, with no large  bowel wall thickening or pericolonic fat stranding. Vascular/Lymphatic: Atherosclerotic nonaneurysmal abdominal aorta. Patent portal, splenic, hepatic and renal veins. No pathologically enlarged lymph nodes in the abdomen or pelvis. Reproductive: Poorly visualized prostate due to streak artifact from the left hip hardware. Prostate appears mildly enlarged. Other: No pneumoperitoneum. No ascites. No focal fluid collection. There is a heterogeneously enhancing 1.5 x 1.4 cm left paracolic gutter peritoneal nodule (series 2/image 44). Musculoskeletal: There is widespread patchy sclerosis throughout the visualized thoracolumbar spine, sacrum, bilateral iliac bones and lower ribs bilaterally. Mild thoracolumbar spondylosis. Partially visualized left total hip arthroplasty. IMPRESSION: 1. Moderate diffuse intrahepatic biliary ductal dilatation. 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 2. Hypodense 2.1 cm mass at the posterior superior margin of the pancreatic neck with associated infiltrative soft tissue extending into the porta hepatis. No main pancreatic duct dilation. This mass could represent a primary pancreatic adenocarcinoma or malignant peripancreatic lymph node. 3. Irregular annular wall thickening and hyperenhancement in the fundal gallbladder with loss of the normal fat plane between the fundal gallbladder and liver. Cannot exclude gallbladder carcinoma or serosal carcinomatosis in this location. 4. Left paracolic gutter peritoneal nodule suspicious for peritoneal carcinomatosis. 5. Suggestion of small bilateral adrenal nodules, cannot exclude adrenal metastases. 6. MRI of the abdomen with MRCP without and with IV contrast may be useful for further evaluation of the above findings. 7. 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 Recommend correlation with serum PSA. 8. Additional findings include aortic  atherosclerosis and mild sigmoid diverticulosis. Electronically Signed   By: Ilona Sorrel M.D.   On: 07/13/2016 18:21   Mr 3d Recon At Scanner  Result Date: 07/14/2016 CLINICAL DATA:  Common bile duct stricture. EXAM: MRI ABDOMEN WITHOUT AND WITH CONTRAST (INCLUDING MRCP) TECHNIQUE: Multiplanar multisequence MR imaging of the abdomen was performed both before and after the administration of intravenous contrast. Heavily T2-weighted images of the biliary and pancreatic ducts were obtained, and three-dimensional MRCP images were rendered by post processing. CONTRAST:  28mL MULTIHANCE GADOBENATE DIMEGLUMINE 529 MG/ML IV SOLN COMPARISON:  CT 07/13/2016 has high FINDINGS: Lower chest:  Lung bases are clear. Hepatobiliary: There is intrahepatic and extrahepatic biliary duct dilatation. Stricturing of the bile duct at the junction of the common hepatic duct and common bile duct (image 12, series 9). More distal stricturing of the common hepatic duct (image 13, series 9). Both the strictures occur over approximately 1 cm segment. The common bile duct appears normal caliber at the ampullary level. Within the hepatic parenchyma. The bile ducts have a mild beaded appearance. There is suggestion of sludge within the bile ducts of the RIGHT hepatic lobe (image 17, series 3 image 16 series 3). There is a cholesterol gallstone within  the fundus of the gallbladder seen on the precontrast T1 weighted imaging (series 14). Pancreas: Along the ventral aspect of the pancreatic head there is a lesion which is either of the pancreas or adjacent the pancreas which is hypointense to normal pancreatic parenchyma measuring 20 mm image 35, series 4. This lesion is hypo enhancing on the arterial phase (image 34, series 15). Favor lesion to be and within the pancreatic parenchyma. There is no significant pancreatic duct dilatation. Spleen: Normal spleen. Adrenals/urinary tract: Thickening and adrenal glands which is incompletely  characterized. Stomach/Bowel: Stomach and limited of the small bowel is unremarkable Vascular/Lymphatic: Abdominal aortic normal caliber. No retroperitoneal periportal lymphadenopathy. Musculoskeletal: Heterogeneous marrow signal within the entirety of the spine (image 19, series 189) para IMPRESSION: 1. Intrahepatic and extrahepatic duct dilatation with segments of bile duct stricturing in the common hepatic duct and common bile duct. Additionally there is mild beaded appearance of the intrahepatic ducts and cholestasis in the RIGHT hepatic lobe. Differential considerations include cholangiocarcinoma of the extrahepatic ducts, occult malignant stricturing of the extrahepatic ducts and primary sclerosing cholangitis with or without associated cholangiocarcinoma. 2. Lesion within or adjacent to the pancreatic head concerning for pancreatic neoplasm versus a malignant lymph node. Recommend endoscopic ultrasound with tissue sampling. Recommend tissue sampling of the extrahepatic ducts at same time. 3. Cholesterol gallstone within fundus of the gallbladder. 4. 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 Electronically Signed   By: Suzy Bouchard M.D.   On: 07/14/2016 11:21   Mr Jeananne Rama W/wo Cm/mrcp  Result Date: 07/14/2016 CLINICAL DATA:  Common bile duct stricture. EXAM: MRI ABDOMEN WITHOUT AND WITH CONTRAST (INCLUDING MRCP) TECHNIQUE: Multiplanar multisequence MR imaging of the abdomen was performed both before and after the administration of intravenous contrast. Heavily T2-weighted images of the biliary and pancreatic ducts were obtained, and three-dimensional MRCP images were rendered by post processing. CONTRAST:  32mL MULTIHANCE GADOBENATE DIMEGLUMINE 529 MG/ML IV SOLN COMPARISON:  CT 07/13/2016 has high FINDINGS: Lower chest:  Lung bases are clear. Hepatobiliary: There is intrahepatic and extrahepatic biliary duct dilatation. Stricturing of the bile duct at the  junction of the common hepatic duct and common bile duct (image 12, series 9). More distal stricturing of the common hepatic duct (image 13, series 9). Both the strictures occur over approximately 1 cm segment. The common bile duct appears normal caliber at the ampullary level. Within the hepatic parenchyma. The bile ducts have a mild beaded appearance. There is suggestion of sludge within the bile ducts of the RIGHT hepatic lobe (image 17, series 3 image 16 series 3). There is a cholesterol gallstone within the fundus of the gallbladder seen on the precontrast T1 weighted imaging (series 14). Pancreas: Along the ventral aspect of the pancreatic head there is a lesion which is either of the pancreas or adjacent the pancreas which is hypointense to normal pancreatic parenchyma measuring 20 mm image 35, series 4. This lesion is hypo enhancing on the arterial phase (image 34, series 15). Favor lesion to be and within the pancreatic parenchyma. There is no significant pancreatic duct dilatation. Spleen: Normal spleen. Adrenals/urinary tract: Thickening and adrenal glands which is incompletely characterized. Stomach/Bowel: Stomach and limited of the small bowel is unremarkable Vascular/Lymphatic: Abdominal aortic normal caliber. No retroperitoneal periportal lymphadenopathy. Musculoskeletal: Heterogeneous marrow signal within the entirety of the spine (image 19, series 189) para IMPRESSION: 1. Intrahepatic and extrahepatic duct dilatation with segments of bile duct stricturing in the common hepatic duct and common bile duct.  Additionally there is mild beaded appearance of the intrahepatic ducts and cholestasis in the RIGHT hepatic lobe. Differential considerations include cholangiocarcinoma of the extrahepatic ducts, occult malignant stricturing of the extrahepatic ducts and primary sclerosing cholangitis with or without associated cholangiocarcinoma. 2. Lesion within or adjacent to the pancreatic head concerning for  pancreatic neoplasm versus a malignant lymph node. Recommend endoscopic ultrasound with tissue sampling. Recommend tissue sampling of the extrahepatic ducts at same time. 3. Cholesterol gallstone within fundus of the gallbladder. 4. 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 Electronically Signed   By: Suzy Bouchard M.D.   On: 07/14/2016 11:21    EKG: Independently reviewed. Pending  Assessment/Plan Pancreatic mass/hyperbilirubinemia/elevated LFTs.: Place of full liquid diet and nothing by mouth after midnight place an IV. Toradol for pain, GI Dr. Almyra Free has already been notified, for possible ERCP with biliary stenting and biopsy. Differential includes pancreatic adenocarcinoma versus cholangiocarcinoma.  Oncology Dr. Erven Colla has seen the patient at [facility] and the patient will have to follow-up with him as an outpatient on Monday or Tuesday.  Essential hypertension Hold antihypertensive medication. Comparison in the morning.  History of tobacco use Counseling.   Protein-calorie malnutrition, severe Ensure 3 times a day.   Code Status: full DVT Prophylaxis:heparin Family Communication: none Disposition Plan: after ERCP  Time spent: 62 min  FELIZ Marguarite Arbour [facility] Pager [phone]

## 2016-07-15 NOTE — Telephone Encounter (Signed)
Pt being D/C from [facility] today for renal mass. Scheduled HFU in 2 weeks. Renaldo Fiddler, CMA

## 2016-07-15 NOTE — Care Management (Signed)
Admitted to [facility] with the diagnosis of pancreatic mass. Lives with wife, Olin Hauser [phone]). Last seen Dr. Caryn Section Monday. No home health. No skilled facility. No home oxygen. Uses no aids for ambulation. No falls. Decreased appetite x 2 weeks. Lost 4-5 pounds. Takes care of all basic and instrumental activities of daily living himself, drives. 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 Prescriptions are filled at [facility]. Wife will transport. Needs ERCP. Shelbie Ammons RN MSN CCM Care Management [phone]

## 2016-07-15 NOTE — Discharge Summary (Signed)
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 Carpal tunnel syndrome   . History of measles   . Hyperplastic colonic polyp 2006   Excised Dr. Tiffany Kocher  . Hypertension   . Osteoarthrosis   . Prostate nodule     HOSPITAL COURSE:   Francisco Charles a 65 y.o. malewith a known history of Hypertension, arthritis presents to hospital secondary to worsening abdominal pain and inability to eat solid foods for almost a week.  #1 New diagnosis of pancreatic mass with CBD malignant stricture - GI consult at [facility] for possible ERCP ,stent placement if possible and biopsy. Case d/w dr Benson Norway at [facility] - oncology consult- spoke with DR finnegan -CLD - pain control, IV fluids -hold ASA(pts home med till procedure is done)  #2 Jaundice- likely from above Direct bilirubin elevated due to malignant looking CBD stricture  #3 GERD- protonix  #4 HTN- hold meds  #5 DVT prophylaxis-ambulatory Hold in anticipation for ERCP tomorrow  Pt has been accepted at [facility] long under hospitalist service Pt and wife aware and agreeable CONSULTS OBTAINED:  Treatment Team:  Lollie Sails, MD Cammie Sickle, MD  DRUG ALLERGIES:  No Known Allergies  DISCHARGE MEDICATIONS:   Current Discharge Medication List    START taking these medications   Details  feeding supplement (BOOST / RESOURCE BREEZE)  LIQD Take 1 Container by mouth 3 (three) times daily between meals. Qty: 30 Container, Refills: 0      CONTINUE these medications which have NOT CHANGED   Details  methocarbamol (ROBAXIN) 750 MG tablet 1-2 tablets up to 4 times/day if needed for muscle pain and spasm    omeprazole (PRILOSEC) 20 MG capsule Take 1 capsule by mouth daily.    quinapril-hydrochlorothiazide (ACCURETIC) 20-12.5 MG tablet TAKE 1 TABLET BY MOUTH DAILY. Qty: 30 tablet, Refills: 3      STOP taking these medications     aspirin 81 MG tablet      ciprofloxacin (CIPRO) 500 MG tablet         If you experience worsening of your admission symptoms, develop shortness of breath, life threatening emergency, suicidal or homicidal thoughts you must seek medical attention immediately by calling 911 or calling your MD immediately  if symptoms less severe.  You Must read complete instructions/literature along with all the possible adverse reactions/side effects for all the Medicines you take and that have been prescribed to you. Take any new Medicines after you have completely understood and accept all the possible adverse reactions/side effects.   Please note  You were cared for by a hospitalist during your hospital stay. If you have any questions about your discharge medications or the care you received while you were in the hospital after you are discharged, you can call the unit and asked to speak with the hospitalist on call if the hospitalist that took care of you is not available. Once you are discharged, your primary care physician  will handle any further medical issues. Please note that NO REFILLS for any discharge medications will be authorized once you are discharged, as it is imperative that you return to your primary care physician (or establish a relationship with a primary care physician if you do not have one) for your aftercare needs so that they can reassess your need for medications and monitor your lab  values. Today   SUBJECTIVE   Doing ok  VITAL SIGNS:  Blood pressure (!) 186/89, pulse (!) 58, temperature 97.7 F (36.5 C), temperature source Oral, resp. rate 20, height 5\' 7"  (1.702 m), weight 158 lb 14.4 oz (72.1 kg), SpO2 98 %.  I/O:   Intake/Output Summary (Last 24 hours) at 07/15/16 0922 Last data filed at 07/15/16 0658  Gross per 24 hour  Intake          1733.75 ml  Output             1480 ml  Net           253.75 ml    PHYSICAL EXAMINATION:  GENERAL:  65 y.o.-year-old patient lying in the bed with no acute distress.  EYES: Pupils equal, round, reactive to light and accommodation. ++ scleral icterus. Extraocular muscles intact.  HEENT: Head atraumatic, normocephalic. Oropharynx and nasopharynx clear.  NECK:  Supple, no jugular venous distention. No thyroid enlargement, no tenderness.  LUNGS: Normal breath sounds bilaterally, no wheezing, rales,rhonchi or crepitation. No use of accessory muscles of respiration.  CARDIOVASCULAR: S1, S2 normal. No murmurs, rubs, or gallops.  ABDOMEN: Soft, non-tender, non-distended. Bowel sounds present. No organomegaly or mass.  EXTREMITIES: No pedal edema, cyanosis, or clubbing.  NEUROLOGIC: Cranial nerves II through XII are intact. Muscle strength 5/5 in all extremities. Sensation intact. Gait not checked.  PSYCHIATRIC: The patient is alert and oriented x 3.  SKIN: No obvious rash, lesion, or ulcer.   DATA REVIEW:   CBC   Recent Labs Lab 07/14/16 0405  WBC 10.0  HGB 16.1  HCT 48.2  PLT 209    Chemistries   Recent Labs Lab 07/13/16 1335 07/14/16 0405  NA 133* 135  K 3.5 3.4*  CL 93* 98*  CO2 31 27  GLUCOSE 115* 99  BUN 8 9  CREATININE 0.90 0.76  CALCIUM 10.4* 9.5  AST 203*  --   ALT 323*  --   ALKPHOS 896*  --   BILITOT 6.7*  --     Microbiology Results   No results found for this or any previous visit (from the past 240 hour(s)).  RADIOLOGY:  Ct Abdomen Pelvis W Contrast  Result Date:  07/13/2016 CLINICAL DATA:  65 year old male with right abdominal pain, most prominent in the right lower quadrant. Elevated liver function tests. Postprandial nausea. History of prostate biopsy. EXAM: CT ABDOMEN AND PELVIS WITH CONTRAST TECHNIQUE: Multidetector CT imaging of the abdomen and pelvis was performed using the standard protocol following bolus administration of intravenous contrast. CONTRAST:  173mL ISOVUE-300 IOPAMIDOL (ISOVUE-300) INJECTION 61% COMPARISON:  09/29/2012 bone protocol CT pelvis. No prior CT abdomen/pelvis. FINDINGS: Lower chest: No significant pulmonary nodules or acute consolidative airspace disease. Hepatobiliary: Normal liver size. Simple 0.8 cm liver cyst in segment 8 of the right liver lobe. No additional discrete liver lesions. There is slightly irregular annular wall thickening and mucosal hyperenhancement in the fundal gallbladder wall, with loss of the normal fat plane between the fundal gallbladder wall and liver (series 2/ image 32). No radiopaque cholelithiasis. No significant gallbladder distention. No pericholecystic  fluid. There is moderate diffuse intrahepatic biliary ductal dilatation. There is a focal stricture in the upper third of the common bile duct with associated common bile duct wall thickening and hyperenhancement (best seen on coronal series 5/ image 46). Pancreas: No pancreatic duct dilation. There is a heterogeneous hypodense 2.1 x 1.5 cm mass at the posterior superior margin of the pancreatic neck (series 2/ image 29), which demonstrates ill-defined margins with infiltrative soft tissue extending into the porta hepatis along the common bile duct. Spleen: Normal size. No mass. Adrenals/Urinary Tract: There is irregular thickening of the bilateral adrenal glands, with the suggestion of small adrenal nodules bilaterally measuring 1.0 cm on the right (series 2/ image 24) and 1.3 cm on the left (series 2/ image 25). No hydronephrosis. Simple 2.0 cm interpolar left  renal cyst. Simple 1.0 cm lower right renal cyst. No additional renal lesions. Limited visualization of the collapsed bladder due to streak artifact from left hip hardware with no gross bladder abnormality . Stomach/Bowel: Grossly normal stomach. Normal caliber small bowel with no small bowel wall thickening. Normal appendix . Mild sigmoid diverticulosis, with no large bowel wall thickening or pericolonic fat stranding. Vascular/Lymphatic: Atherosclerotic nonaneurysmal abdominal aorta. Patent portal, splenic, hepatic and renal veins. No pathologically enlarged lymph nodes in the abdomen or pelvis. Reproductive: Poorly visualized prostate due to streak artifact from the left hip hardware. Prostate appears mildly enlarged. Other: No pneumoperitoneum. No ascites. No focal fluid collection. There is a heterogeneously enhancing 1.5 x 1.4 cm left paracolic gutter peritoneal nodule (series 2/image 44). Musculoskeletal: There is widespread patchy sclerosis throughout the visualized thoracolumbar spine, sacrum, bilateral iliac bones and lower ribs bilaterally. Mild thoracolumbar spondylosis. Partially visualized left total hip arthroplasty. IMPRESSION: 1. Moderate diffuse intrahepatic biliary ductal dilatation. 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 2. Hypodense 2.1 cm mass at the posterior superior margin of the pancreatic neck with associated infiltrative soft tissue extending into the porta hepatis. No main pancreatic duct dilation. This mass could represent a primary pancreatic adenocarcinoma or malignant peripancreatic lymph node. 3. Irregular annular wall thickening and hyperenhancement in the fundal gallbladder with loss of the normal fat plane between the fundal gallbladder and liver. Cannot exclude gallbladder carcinoma or serosal carcinomatosis in this location. 4. Left paracolic gutter peritoneal nodule suspicious for peritoneal carcinomatosis. 5. Suggestion of small bilateral  adrenal nodules, cannot exclude adrenal metastases. 6. MRI of the abdomen with MRCP without and with IV contrast may be useful for further evaluation of the above findings. 7. 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 Recommend correlation with serum PSA. 8. Additional findings include aortic atherosclerosis and mild sigmoid diverticulosis. Electronically Signed   By: Ilona Sorrel M.D.   On: 07/13/2016 18:21   Mr 3d Recon At Scanner  Result Date: 07/14/2016 CLINICAL DATA:  Common bile duct stricture. EXAM: MRI ABDOMEN WITHOUT AND WITH CONTRAST (INCLUDING MRCP) TECHNIQUE: Multiplanar multisequence MR imaging of the abdomen was performed both before and after the administration of intravenous contrast. Heavily T2-weighted images of the biliary and pancreatic ducts were obtained, and three-dimensional MRCP images were rendered by post processing. CONTRAST:  28mL MULTIHANCE GADOBENATE DIMEGLUMINE 529 MG/ML IV SOLN COMPARISON:  CT 07/13/2016 has high FINDINGS: Lower chest:  Lung bases are clear. Hepatobiliary: There is intrahepatic and extrahepatic biliary duct dilatation. Stricturing of the bile duct at the junction of the common hepatic duct and common bile duct (image 12, series 9). More distal stricturing  of the common hepatic duct (image 13, series 9). Both the strictures occur over approximately 1 cm segment. The common bile duct appears normal caliber at the ampullary level. Within the hepatic parenchyma. The bile ducts have a mild beaded appearance. There is suggestion of sludge within the bile ducts of the RIGHT hepatic lobe (image 17, series 3 image 16 series 3). There is a cholesterol gallstone within the fundus of the gallbladder seen on the precontrast T1 weighted imaging (series 14). Pancreas: Along the ventral aspect of the pancreatic head there is a lesion which is either of the pancreas or adjacent the pancreas which is  hypointense to normal pancreatic parenchyma measuring 20 mm image 35, series 4. This lesion is hypo enhancing on the arterial phase (image 34, series 15). Favor lesion to be and within the pancreatic parenchyma. There is no significant pancreatic duct dilatation. Spleen: Normal spleen. Adrenals/urinary tract: Thickening and adrenal glands which is incompletely characterized. Stomach/Bowel: Stomach and limited of the small bowel is unremarkable Vascular/Lymphatic: Abdominal aortic normal caliber. No retroperitoneal periportal lymphadenopathy. Musculoskeletal: Heterogeneous marrow signal within the entirety of the spine (image 19, series 189) para IMPRESSION: 1. Intrahepatic and extrahepatic duct dilatation with segments of bile duct stricturing in the common hepatic duct and common bile duct. Additionally there is mild beaded appearance of the intrahepatic ducts and cholestasis in the RIGHT hepatic lobe. Differential considerations include cholangiocarcinoma of the extrahepatic ducts, occult malignant stricturing of the extrahepatic ducts and primary sclerosing cholangitis with or without associated cholangiocarcinoma. 2. Lesion within or adjacent to the pancreatic head concerning for pancreatic neoplasm versus a malignant lymph node. Recommend endoscopic ultrasound with tissue sampling. Recommend tissue sampling of the extrahepatic ducts at same time. 3. Cholesterol gallstone within fundus of the gallbladder. 4. 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 Electronically Signed   By: Suzy Bouchard M.D.   On: 07/14/2016 11:21   Mr Jeananne Rama W/wo Cm/mrcp  Result Date: 07/14/2016 CLINICAL DATA:  Common bile duct stricture. EXAM: MRI ABDOMEN WITHOUT AND WITH CONTRAST (INCLUDING MRCP) TECHNIQUE: Multiplanar multisequence MR imaging of the abdomen was performed both before and after the administration of intravenous contrast. Heavily T2-weighted images of the biliary and  pancreatic ducts were obtained, and three-dimensional MRCP images were rendered by post processing. CONTRAST:  60mL MULTIHANCE GADOBENATE DIMEGLUMINE 529 MG/ML IV SOLN COMPARISON:  CT 07/13/2016 has high FINDINGS: Lower chest:  Lung bases are clear. Hepatobiliary: There is intrahepatic and extrahepatic biliary duct dilatation. Stricturing of the bile duct at the junction of the common hepatic duct and common bile duct (image 12, series 9). More distal stricturing of the common hepatic duct (image 13, series 9). Both the strictures occur over approximately 1 cm segment. The common bile duct appears normal caliber at the ampullary level. Within the hepatic parenchyma. The bile ducts have a mild beaded appearance. There is suggestion of sludge within the bile ducts of the RIGHT hepatic lobe (image 17, series 3 image 16 series 3). There is a cholesterol gallstone within the fundus of the gallbladder seen on the precontrast T1 weighted imaging (series 14). Pancreas: Along the ventral aspect of the pancreatic head there is a lesion which is either of the pancreas or adjacent the pancreas which is hypointense to normal pancreatic parenchyma measuring 20 mm image 35, series 4. This lesion is hypo enhancing on the arterial phase (image 34, series 15). Favor lesion to be and within the pancreatic parenchyma. There is no significant pancreatic duct dilatation.  Spleen: Normal spleen. Adrenals/urinary tract: Thickening and adrenal glands which is incompletely characterized. Stomach/Bowel: Stomach and limited of the small bowel is unremarkable Vascular/Lymphatic: Abdominal aortic normal caliber. No retroperitoneal periportal lymphadenopathy. Musculoskeletal: Heterogeneous marrow signal within the entirety of the spine (image 19, series 189) para IMPRESSION: 1. Intrahepatic and extrahepatic duct dilatation with segments of bile duct stricturing in the common hepatic duct and common bile duct. Additionally there is mild beaded  appearance of the intrahepatic ducts and cholestasis in the RIGHT hepatic lobe. Differential considerations include cholangiocarcinoma of the extrahepatic ducts, occult malignant stricturing of the extrahepatic ducts and primary sclerosing cholangitis with or without associated cholangiocarcinoma. 2. Lesion within or adjacent to the pancreatic head concerning for pancreatic neoplasm versus a malignant lymph node. Recommend endoscopic ultrasound with tissue sampling. Recommend tissue sampling of the extrahepatic ducts at same time. 3. Cholesterol gallstone within fundus of the gallbladder. 4. 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 Electronically Signed   By: Suzy Bouchard M.D.   On: 07/14/2016 11:21     Management plans discussed with the patient, family and they are in agreement.  CODE STATUS:     Code Status Orders        Start     Ordered   07/13/16 2146  Full code  Continuous     07/13/16 2145    Code Status History    Date Active Date Inactive Code Status Order ID Comments User Context   07/23/2013 11:34 AM 07/24/2013  2:18 PM Full Code [id number]  Lucille Passy Babish, PA-C Inpatient      TOTAL TIME TAKING CARE OF THIS PATIENT: 40* minutes.    Madelyn Tlatelpa M.D on 07/15/2016 at 9:22 AM  Between 7am to 6pm - Pager - [phone] After 6pm go to [email or web address] - password [email or web address] [facility]  Office  [phone]  CC: Primary care physician; Lelon Huh, MD

## 2016-07-15 NOTE — Progress Notes (Signed)
Pt a/o. Pt to be transferred to [facility] facility  [facility]ia [facility]  To have ercp on tomorrow.  Prn earlier for c/o abd pain with relief now.

## 2016-07-16 ENCOUNTER — Inpatient Hospital Stay (HOSPITAL_COMMUNITY): Payer: No Typology Code available for payment source | Admitting: Anesthesiology

## 2016-07-16 ENCOUNTER — Encounter (HOSPITAL_COMMUNITY)
Admission: AD | Disposition: A | Discharge status: Home / Self Care | Payer: Self-pay | Source: Other Acute Inpatient Hospital | Attending: Internal Medicine

## 2016-07-16 ENCOUNTER — Encounter (HOSPITAL_COMMUNITY): Payer: Self-pay

## 2016-07-16 ENCOUNTER — Inpatient Hospital Stay (HOSPITAL_COMMUNITY): Payer: No Typology Code available for payment source

## 2016-07-16 DIAGNOSIS — K838 Other specified diseases of biliary tract: Secondary | ICD-10-CM

## 2016-07-16 DIAGNOSIS — K831 Obstruction of bile duct: Secondary | ICD-10-CM

## 2016-07-16 HISTORY — PX: ERCP: SHX5425

## 2016-07-16 LAB — BASIC METABOLIC PANEL
ANION GAP: 8 (ref 5–15)
BUN: 6 mg/dL (ref 6–20)
CHLORIDE: 98 mmol/L — AB (ref 101–111)
CO2: 27 mmol/L (ref 22–32)
Calcium: 9.4 mg/dL (ref 8.9–10.3)
Creatinine, Ser: 0.6 mg/dL — ABNORMAL LOW (ref 0.61–1.24)
Glucose, Bld: 123 mg/dL — ABNORMAL HIGH (ref 65–99)
POTASSIUM: 3.6 mmol/L (ref 3.5–5.1)
SODIUM: 133 mmol/L — AB (ref 135–145)

## 2016-07-16 LAB — CBC
HEMATOCRIT: 44.5 % (ref 39.0–52.0)
Hemoglobin: 15.5 g/dL (ref 13.0–17.0)
MCH: 30.2 pg (ref 26.0–34.0)
MCHC: 34.8 g/dL (ref 30.0–36.0)
MCV: 86.7 fL (ref 78.0–100.0)
Platelets: 244 10*3/uL (ref 150–400)
RBC: 5.13 MIL/uL (ref 4.22–5.81)
RDW: 14.1 % (ref 11.5–15.5)
WBC: 9.8 10*3/uL (ref 4.0–10.5)

## 2016-07-16 LAB — MPO/PR-3 (ANCA) ANTIBODIES: Myeloperoxidase Abs: <9 U/mL (ref 0.0–9.0)

## 2016-07-16 SURGERY — ERCP, WITH INTERVENTION IF INDICATED
Anesthesia: General

## 2016-07-16 MED ORDER — PHENYLEPHRINE HCL 10 MG/ML IJ SOLN
INTRAMUSCULAR | Status: DC | PRN
Start: 2016-07-16 — End: 2016-07-16
  Administered 2016-07-16 (×2): 40 ug via INTRAVENOUS
  Administered 2016-07-16: 80 ug via INTRAVENOUS
  Administered 2016-07-16: 40 ug via INTRAVENOUS

## 2016-07-16 MED ORDER — SODIUM CHLORIDE 0.9 % IV SOLN: INTRAVENOUS | Status: DC

## 2016-07-16 MED ORDER — HYDRALAZINE HCL 20 MG/ML IJ SOLN: 5.0000 mg | Freq: Three times a day (TID) | INTRAMUSCULAR | Status: DC | PRN

## 2016-07-16 MED ORDER — DEXAMETHASONE SODIUM PHOSPHATE 4 MG/ML IJ SOLN
INTRAMUSCULAR | Status: DC | PRN
  Administered 2016-07-16: 10 mg via INTRAVENOUS

## 2016-07-16 MED ORDER — LACTATED RINGERS IV SOLN
INTRAVENOUS | Status: DC | PRN
  Administered 2016-07-16: 11:00:00 via INTRAVENOUS

## 2016-07-16 MED ORDER — PROPOFOL 10 MG/ML IV BOLUS
INTRAVENOUS | Status: AC
  Filled 2016-07-16: qty 20

## 2016-07-16 MED ORDER — LIDOCAINE HCL (CARDIAC) 20 MG/ML IV SOLN
INTRAVENOUS | Status: DC | PRN
  Administered 2016-07-16: 50 mg via INTRAVENOUS

## 2016-07-16 MED ORDER — TRAMADOL HCL 50 MG PO TABS
100.0000 mg | ORAL_TABLET | Freq: Four times a day (QID) | ORAL | Status: DC | PRN
  Administered 2016-07-16 – 2016-07-19 (×7): 100 mg via ORAL
  Filled 2016-07-16 (×7): qty 2

## 2016-07-16 MED ORDER — FENTANYL CITRATE (PF) 100 MCG/2ML IJ SOLN: 25.0000 ug | INTRAMUSCULAR | Status: DC | PRN

## 2016-07-16 MED ORDER — FENTANYL CITRATE (PF) 100 MCG/2ML IJ SOLN
INTRAMUSCULAR | Status: AC
  Filled 2016-07-16: qty 2

## 2016-07-16 MED ORDER — PHENYLEPHRINE 40 MCG/ML (10ML) SYRINGE FOR IV PUSH (FOR BLOOD PRESSURE SUPPORT)
PREFILLED_SYRINGE | INTRAVENOUS | Status: AC
Start: 2016-07-16 — End: 2016-07-16
  Filled 2016-07-16: qty 10

## 2016-07-16 MED ORDER — LIDOCAINE HCL (CARDIAC) 20 MG/ML IV SOLN
INTRAVENOUS | Status: AC
  Filled 2016-07-16: qty 5

## 2016-07-16 MED ORDER — FENTANYL CITRATE (PF) 100 MCG/2ML IJ SOLN
INTRAMUSCULAR | Status: DC | PRN
  Administered 2016-07-16 (×4): 25 ug via INTRAVENOUS
  Administered 2016-07-16: 100 ug via INTRAVENOUS

## 2016-07-16 MED ORDER — PROPOFOL 10 MG/ML IV BOLUS
INTRAVENOUS | Status: DC | PRN
  Administered 2016-07-16: 180 mg via INTRAVENOUS
  Administered 2016-07-16 (×2): 50 mg via INTRAVENOUS

## 2016-07-16 MED ORDER — ONDANSETRON HCL 4 MG/2ML IJ SOLN
INTRAMUSCULAR | Status: AC
  Filled 2016-07-16: qty 2

## 2016-07-16 MED ORDER — GLUCAGON HCL RDNA (DIAGNOSTIC) 1 MG IJ SOLR
INTRAMUSCULAR | Status: AC
  Filled 2016-07-16: qty 1

## 2016-07-16 MED ORDER — INDOMETHACIN 50 MG RE SUPP
RECTAL | Status: DC | PRN
  Administered 2016-07-16 (×2): 50 mg via RECTAL

## 2016-07-16 MED ORDER — SUCCINYLCHOLINE CHLORIDE 20 MG/ML IJ SOLN
INTRAMUSCULAR | Status: DC | PRN
  Administered 2016-07-16: 100 mg via INTRAVENOUS

## 2016-07-16 MED ORDER — PROPOFOL 10 MG/ML IV BOLUS
INTRAVENOUS | Status: AC
Start: 2016-07-16 — End: 2016-07-16
  Filled 2016-07-16: qty 20

## 2016-07-16 MED ORDER — CIPROFLOXACIN IN D5W 400 MG/200ML IV SOLN
400.0000 mg | Freq: Once | INTRAVENOUS | Status: AC
  Administered 2016-07-16: 400 mg via INTRAVENOUS

## 2016-07-16 MED ORDER — CIPROFLOXACIN IN D5W 400 MG/200ML IV SOLN
INTRAVENOUS | Status: AC
  Filled 2016-07-16: qty 200

## 2016-07-16 MED ORDER — DEXAMETHASONE SODIUM PHOSPHATE 10 MG/ML IJ SOLN
INTRAMUSCULAR | Status: AC
  Filled 2016-07-16: qty 1

## 2016-07-16 MED ORDER — INDOMETHACIN 50 MG RE SUPP
RECTAL | Status: AC
Start: 2016-07-16 — End: 2016-07-16
  Filled 2016-07-16: qty 2

## 2016-07-16 MED ORDER — GLUCAGON HCL RDNA (DIAGNOSTIC) 1 MG IJ SOLR
INTRAMUSCULAR | Status: DC | PRN
  Administered 2016-07-16: .5 mg via INTRAVENOUS

## 2016-07-16 MED ORDER — ONDANSETRON HCL 4 MG/2ML IJ SOLN
INTRAMUSCULAR | Status: DC | PRN
  Administered 2016-07-16: 4 mg via INTRAVENOUS

## 2016-07-16 NOTE — Interval H&P Note (Signed)
History and Physical Interval Note:  07/16/2016 11:37 AM  Francisco Charles  has presented today for surgery, with the diagnosis of Pancreatic head mass with biliary obstruction  The various methods of treatment have been discussed with the patient and family. After consideration of risks, benefits and other options for treatment, the patient has consented to  Procedure(s): ENDOSCOPIC RETROGRADE CHOLANGIOPANCREATOGRAPHY (ERCP) (N/A) as a surgical intervention .  The patient's history has been reviewed, patient examined, no change in status, stable for surgery.  I have reviewed the patient's chart and labs.  Questions were answered to the patient's satisfaction.     Montoya Brandel D

## 2016-07-16 NOTE — Progress Notes (Signed)
Initial Nutrition Assessment  DOCUMENTATION CODES:   Not applicable  INTERVENTION:  -Boost Breeze po TID, each supplement provides 250 kcal and 9 grams of protein w/ advancement -RD to continue to monitor  NUTRITION DIAGNOSIS:   Inadequate oral intake related to poor appetite, chronic illness, acute illness as evidenced by per patient/family report.  GOAL:   Patient will meet greater than or equal to 90% of their needs  MONITOR:   PO intake, Diet advancement, Supplement acceptance, I & O's, Labs, Weight trends  REASON FOR ASSESSMENT:   Malnutrition Screening Tool    ASSESSMENT:   EYAL TANTILLO is a 65 y.o. male past medical history of hypertension and arthritis comes into the hospital for several days of worsening abdominal pain, he also relates inability eat solids for almost 2 days. He relates nothing makes it better worst, his pain is in his right upper quadrant radiates to his back no vomiting or nauseated. He was seen by his primary care doctor who treated him empirically with antibiotics for UTI.  Pt recently tx from [facility], presented for ERCP today, was in endoscopy during time of visit. Per chart review, he was eating mostly soups for 1week prior to presentation at [facility], solid foods would cause vomiting. No new wt since tx here, but displays 5#/3% severe wt loss in 1 day from chart review. Previous physical exam was unremarkable. Labs and medications reviewed: Na 133, LFTs elevated, Tot Bili 6.7 D5 @ 33mL/hr --> 306 calories NS @ 14mL/hr  Diet Order:  Diet NPO time specified  Skin:  Reviewed, no issues  Last BM:  8/11  Height:   Ht Readings from Last 1 Encounters:  07/13/16 5\' 7"  (1.702 m)    Weight:   Wt Readings from Last 1 Encounters:  07/13/16 158 lb 14.4 oz (72.1 kg)    Ideal Body Weight:  67.27 kg  BMI:  There is no height or weight on file to calculate BMI.  Estimated Nutritional Needs:   Kcal:  2150-2500  Protein:  86-107 gm  Fluid:   >/= 2L  EDUCATION NEEDS:   No education needs identified at this time  Satira Anis. Tea Collums, MS, RD LDN Inpatient Clinical Dietitian Pager [phone]

## 2016-07-16 NOTE — Anesthesia Procedure Notes (Signed)
Procedure Name: Intubation Date/Time: 07/16/2016 11:43 AM Performed by: Deliah Boston Pre-anesthesia Checklist: Patient identified, Emergency Drugs available, Suction available and Patient being monitored Patient Re-evaluated:Patient Re-evaluated prior to inductionOxygen Delivery Method: Circle system utilized Preoxygenation: Pre-oxygenation with 100% oxygen Intubation Type: IV induction Ventilation: Mask ventilation without difficulty Laryngoscope Size: Mac and 4 Grade View: Grade I Tube type: Oral Tube size: 7.5 mm Number of attempts: 1 Airway Equipment and Method: Oral airway Placement Confirmation: ETT inserted through vocal cords under direct vision,  positive ETCO2 and breath sounds checked- equal and bilateral Secured at: 23 cm Tube secured with: Tape Dental Injury: Teeth and Oropharynx as per pre-operative assessment

## 2016-07-16 NOTE — Progress Notes (Signed)
PROGRESS NOTE    Francisco Charles  [id number] DOB: 1951-01-03 DOA: 07/15/2016 PCP: Lelon Huh, MD    Brief Narrative:  65 year old with past medical history of essential hypertension and arthritis that came to [facility] for worsening abdominal pain, unintentional weight loss and ability to eat the last 2 days, imaging was done which showed a pancreatic mass and obstructive jaundice. Once transferred to [facility] as ERCP services could not be provided at [facility], he is been placed nothing by mouth GI has been consulted for possible ERCP with stenting and biopsy.  Assessment & Plan: Pancreatic mass/Obstructive jaundice: Place the patient nothing by mouth, continue Toradol for pain ERCP with stenting possibly on 07/16/2016 and probably EUS with FNA  As an outpatient. Appreciate GIs assistance.  Essential hypertension Patient seems to be high add hydralazine when necessary as needed. For systolic blood pressure greater than 180.  History of tobacco use Counseling.     Protein-calorie malnutrition, severe Ensure 3 times a day.     DVT prophylaxis: (lovenox Code Status: (Full Family Communication: Wife Disposition Plan:    Consultants:   GI  Procedures:   ERCP on 8.18.2017  Antimicrobials:none    Subjective: Patient relates abdominal pain is controlled with tramadol.  Objective: Vitals:   07/15/16 1332 07/15/16 2155 07/16/16 0530  BP: (!) 176/88 (!) 163/75 (!) 176/90  Pulse: 60 65 (!) 59  Resp: 18 16 16   Temp: 98 F (36.7 C) 98.5 F (36.9 C) 97.7 F (36.5 C)  TempSrc: Oral Oral Oral  SpO2: 100% 99% 95%    Intake/Output Summary (Last 24 hours) at 07/16/16 0813 Last data filed at 07/16/16 0600  Gross per 24 hour  Intake             1070 ml  Output             2900 ml  Net            -1830 ml   There were no vitals filed for this visit.  Examination:  General exam: Appears calm and comfortable  Respiratory system: Clear to auscultation. Respiratory  effort normal. Cardiovascular system: S1 & S2 heard, RRR.  Gastrointestinal system: Abdomen is nondistended, soft and nontender. No organomegaly or masses felt. Normal bowel sounds heard. Central nervous system: Alert and oriented. No focal neurological deficits. Extremities: Symmetric 5 x 5 power. Skin: No rashes, lesions or ulcers Psychiatry: Judgement and insight appear normal. Mood & affect appropriate.     Data Reviewed: I have personally reviewed following labs and imaging studies  CBC:  Recent Labs Lab 07/12/16 1510 07/13/16 1335 07/14/16 0405 07/15/16 1623 07/16/16 0519  WBC 9.7 10.2 10.0 10.3 9.8  HGB  --  17.9 16.1 15.0 15.5  HCT 51.8* 52.2* 48.2 43.0 44.5  MCV 93 89.7 90.7 87.2 86.7  PLT 260 254 209 235 [id number]   Basic Metabolic Panel:  Recent Labs Lab 07/12/16 1510 07/13/16 1335 07/14/16 0405 07/15/16 1623 07/16/16 0519  NA 135 133* 135 136 133*  K 3.7 3.5 3.4* 3.1* 3.6  CL 88* 93* 98* 101 98*  CO2 26 31 27 26 27   GLUCOSE 105* 115* 99 133* 123*  BUN 8 8 9 10 6   CREATININE 1.06 0.90 0.76 0.46* 0.60*  CALCIUM 10.3* 10.4* 9.5 9.3 9.4   GFR: Estimated Creatinine Clearance: 88.4 mL/min (by C-G formula based on SCr of 0.8 mg/dL). Liver Function Tests:  Recent Labs Lab 07/12/16 1510 07/13/16 1335  AST 246* 203*  ALT 351* 323*  ALKPHOS 921* 896*  BILITOT 5.7* 6.7*  PROT 7.7 8.2*  ALBUMIN 4.4 4.0    Recent Labs Lab 07/13/16 1335  LIPASE 26   No results for input(s): AMMONIA in the last 168 hours. Coagulation Profile: No results for input(s): INR, PROTIME in the last 168 hours. Cardiac Enzymes: No results for input(s): CKTOTAL, CKMB, CKMBINDEX, TROPONINI in the last 168 hours. BNP (last 3 results) No results for input(s): PROBNP in the last 8760 hours. HbA1C: No results for input(s): HGBA1C in the last 72 hours. CBG: No results for input(s): GLUCAP in the last 168 hours. Lipid Profile: No results for input(s): CHOL, HDL, LDLCALC, TRIG,  CHOLHDL, LDLDIRECT in the last 72 hours. Thyroid Function Tests: No results for input(s): TSH, T4TOTAL, FREET4, T3FREE, THYROIDAB in the last 72 hours. Anemia Panel: No results for input(s): VITAMINB12, FOLATE, FERRITIN, TIBC, IRON, RETICCTPCT in the last 72 hours. Sepsis Labs: No results for input(s): PROCALCITON, LATICACIDVEN in the last 168 hours.  No results found for this or any previous visit (from the past 240 hour(s)).       Radiology Studies: Mr 3d Recon At Scanner  Result Date: 07/14/2016 CLINICAL DATA:  Common bile duct stricture. EXAM: MRI ABDOMEN WITHOUT AND WITH CONTRAST (INCLUDING MRCP) TECHNIQUE: Multiplanar multisequence MR imaging of the abdomen was performed both before and after the administration of intravenous contrast. Heavily T2-weighted images of the biliary and pancreatic ducts were obtained, and three-dimensional MRCP images were rendered by post processing. CONTRAST:  50mL MULTIHANCE GADOBENATE DIMEGLUMINE 529 MG/ML IV SOLN COMPARISON:  CT 07/13/2016 has high FINDINGS: Lower chest:  Lung bases are clear. Hepatobiliary: There is intrahepatic and extrahepatic biliary duct dilatation. Stricturing of the bile duct at the junction of the common hepatic duct and common bile duct (image 12, series 9). More distal stricturing of the common hepatic duct (image 13, series 9). Both the strictures occur over approximately 1 cm segment. The common bile duct appears normal caliber at the ampullary level. Within the hepatic parenchyma. The bile ducts have a mild beaded appearance. There is suggestion of sludge within the bile ducts of the RIGHT hepatic lobe (image 17, series 3 image 16 series 3). There is a cholesterol gallstone within the fundus of the gallbladder seen on the precontrast T1 weighted imaging (series 14). Pancreas: Along the ventral aspect of the pancreatic head there is a lesion which is either of the pancreas or adjacent the pancreas which is hypointense to normal  pancreatic parenchyma measuring 20 mm image 35, series 4. This lesion is hypo enhancing on the arterial phase (image 34, series 15). Favor lesion to be and within the pancreatic parenchyma. There is no significant pancreatic duct dilatation. Spleen: Normal spleen. Adrenals/urinary tract: Thickening and adrenal glands which is incompletely characterized. Stomach/Bowel: Stomach and limited of the small bowel is unremarkable Vascular/Lymphatic: Abdominal aortic normal caliber. No retroperitoneal periportal lymphadenopathy. Musculoskeletal: Heterogeneous marrow signal within the entirety of the spine (image 19, series 189) para IMPRESSION: 1. Intrahepatic and extrahepatic duct dilatation with segments of bile duct stricturing in the common hepatic duct and common bile duct. Additionally there is mild beaded appearance of the intrahepatic ducts and cholestasis in the RIGHT hepatic lobe. Differential considerations include cholangiocarcinoma of the extrahepatic ducts, occult malignant stricturing of the extrahepatic ducts and primary sclerosing cholangitis with or without associated cholangiocarcinoma. 2. Lesion within or adjacent to the pancreatic head concerning for pancreatic neoplasm versus a malignant lymph node. Recommend endoscopic ultrasound with tissue sampling. Recommend tissue sampling of the  extrahepatic ducts at same time. 3. Cholesterol gallstone within fundus of the gallbladder. 4. 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 Electronically Signed   By: Suzy Bouchard M.D.   On: 07/14/2016 11:21   Mr Jeananne Rama W/wo Cm/mrcp  Result Date: 07/14/2016 CLINICAL DATA:  Common bile duct stricture. EXAM: MRI ABDOMEN WITHOUT AND WITH CONTRAST (INCLUDING MRCP) TECHNIQUE: Multiplanar multisequence MR imaging of the abdomen was performed both before and after the administration of intravenous contrast. Heavily T2-weighted images of the biliary and pancreatic ducts were  obtained, and three-dimensional MRCP images were rendered by post processing. CONTRAST:  85mL MULTIHANCE GADOBENATE DIMEGLUMINE 529 MG/ML IV SOLN COMPARISON:  CT 07/13/2016 has high FINDINGS: Lower chest:  Lung bases are clear. Hepatobiliary: There is intrahepatic and extrahepatic biliary duct dilatation. Stricturing of the bile duct at the junction of the common hepatic duct and common bile duct (image 12, series 9). More distal stricturing of the common hepatic duct (image 13, series 9). Both the strictures occur over approximately 1 cm segment. The common bile duct appears normal caliber at the ampullary level. Within the hepatic parenchyma. The bile ducts have a mild beaded appearance. There is suggestion of sludge within the bile ducts of the RIGHT hepatic lobe (image 17, series 3 image 16 series 3). There is a cholesterol gallstone within the fundus of the gallbladder seen on the precontrast T1 weighted imaging (series 14). Pancreas: Along the ventral aspect of the pancreatic head there is a lesion which is either of the pancreas or adjacent the pancreas which is hypointense to normal pancreatic parenchyma measuring 20 mm image 35, series 4. This lesion is hypo enhancing on the arterial phase (image 34, series 15). Favor lesion to be and within the pancreatic parenchyma. There is no significant pancreatic duct dilatation. Spleen: Normal spleen. Adrenals/urinary tract: Thickening and adrenal glands which is incompletely characterized. Stomach/Bowel: Stomach and limited of the small bowel is unremarkable Vascular/Lymphatic: Abdominal aortic normal caliber. No retroperitoneal periportal lymphadenopathy. Musculoskeletal: Heterogeneous marrow signal within the entirety of the spine (image 19, series 189) para IMPRESSION: 1. Intrahepatic and extrahepatic duct dilatation with segments of bile duct stricturing in the common hepatic duct and common bile duct. Additionally there is mild beaded appearance of the  intrahepatic ducts and cholestasis in the RIGHT hepatic lobe. Differential considerations include cholangiocarcinoma of the extrahepatic ducts, occult malignant stricturing of the extrahepatic ducts and primary sclerosing cholangitis with or without associated cholangiocarcinoma. 2. Lesion within or adjacent to the pancreatic head concerning for pancreatic neoplasm versus a malignant lymph node. Recommend endoscopic ultrasound with tissue sampling. Recommend tissue sampling of the extrahepatic ducts at same time. 3. Cholesterol gallstone within fundus of the gallbladder. 4. 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 Electronically Signed   By: Suzy Bouchard M.D.   On: 07/14/2016 11:21        Scheduled Meds: . feeding supplement  1 Container Oral TID BM  . heparin  5,000 Units Subcutaneous Q8H   Continuous Infusions: . dextrose 5 % and 0.45 % NaCl with KCl 40 mEq/L 75 mL/hr at 07/16/16 0600     LOS: 1 day    Time spent: 15 min    Charlynne Cousins, MD [facility] Pager [phone] If 7PM-7AM, please contact night-coverage [email or web address] Password [facility] 07/16/2016, 8:13 AM

## 2016-07-16 NOTE — Anesthesia Preprocedure Evaluation (Addendum)
Anesthesia Evaluation  Patient identified by MRN, date of birth, ID band Patient awake    Reviewed: Allergy & Precautions, H&P , NPO status , Patient's Chart, lab work & pertinent test results  Airway Mallampati: II  TM Distance: >3 FB Neck ROM: Full    Dental no notable dental hx. (+) Partial Upper, Partial Lower   Pulmonary Current Smoker,    Pulmonary exam normal breath sounds clear to auscultation       Cardiovascular hypertension, Pt. on medications  Rhythm:Regular Rate:Normal     Neuro/Psych negative neurological ROS  negative psych ROS   GI/Hepatic negative GI ROS, Neg liver ROS, Pancreatic mass   Endo/Other  negative endocrine ROS  Renal/GU negative Renal ROS  negative genitourinary   Musculoskeletal  (+) Arthritis , Osteoarthritis,    Abdominal   Peds  Hematology negative hematology ROS (+)   Anesthesia Other Findings   Reproductive/Obstetrics negative OB ROS                           Anesthesia Physical Anesthesia Plan  ASA: II  Anesthesia Plan: General   Post-op Pain Management:    Induction: Intravenous  Airway Management Planned: Oral ETT  Additional Equipment:   Intra-op Plan:   Post-operative Plan: Extubation in OR  Informed Consent: I have reviewed the patients History and Physical, chart, labs and discussed the procedure including the risks, benefits and alternatives for the proposed anesthesia with the patient or authorized representative who has indicated his/her understanding and acceptance.   Dental advisory given  Plan Discussed with: CRNA  Anesthesia Plan Comments:         Anesthesia Quick Evaluation

## 2016-07-16 NOTE — Op Note (Signed)
[facility] Patient Name: Francisco Charles Procedure Date: 07/16/2016 MRN: [id number] Attending MD: Carol Ada , MD Date of Birth: 05-02-1951 CSN: [id number] Age: 65 Admit Type: Inpatient Procedure:                ERCP Indications:              Biliary dilation on Computed Tomogram Scan Providers:                Carol Ada, MD, Cleda Daub, RN, Ralene Bathe,                            Technician, Heide Scales, CRNA Referring MD:              Medicines:                General Anesthesia Complications:            No immediate complications. Estimated Blood Loss:     Estimated blood loss: none. Procedure:                Pre-Anesthesia Assessment:                           - Prior to the procedure, a History and Physical                            was performed, and patient medications and                            allergies were reviewed. The patient's tolerance of                            previous anesthesia was also reviewed. The risks                            and benefits of the procedure and the sedation                            options and risks were discussed with the patient.                            All questions were answered, and informed consent                            was obtained. Prior Anticoagulants: The patient has                            taken no previous anticoagulant or antiplatelet                            agents. ASA Grade Assessment: III - A patient with                            severe systemic disease. After reviewing the risks  and benefits, the patient was deemed in                            satisfactory condition to undergo the procedure.                           - Sedation was administered by an anesthesia                            professional. General anesthesia was attained.                           After obtaining informed consent, the scope was                            passed under  direct vision. Throughout the                            procedure, the patient's blood pressure, pulse, and                            oxygen saturations were monitored continuously. The                            [id number] scope was introduced through                            the mouth, and used to inject contrast into and to                            cannulate the ventral pancreatic duct. The ERCP was                            performed with difficulty. The patient tolerated                            the procedure well. Scope In: Scope Out: Findings:      A 12 mm biliary sphincterotomy was made with a monofilament traction       (standard) sphincterotome using ERBE electrocautery. There was no       post-sphincterotomy bleeding.      The papilla was patulous. No drainage of bile was noted. The       sphinctertome was able to engage the papilla enough to create a       sphincterotmy, but this did not aid in the cannulation of the CBD. No       significant biliary drainage was identified to assist in localization of       the CBD. Impression:               - A biliary sphincterotomy was performed. Moderate Sedation:      N/A- Per Anesthesia Care Recommendation:           - Repeat ERCP tomorrow for retreatment. Procedure Code(s):        --- Professional ---  43262, Endoscopic retrograde                            cholangiopancreatography (ERCP); with                            sphincterotomy/papillotomy Diagnosis Code(s):        --- Professional ---                           K83.8, Other specified diseases of biliary tract CPT copyright 2016 [employer]. All rights reserved. The codes documented in this report are preliminary and upon coder review may  be revised to meet current compliance requirements. Carol Ada, MD Carol Ada, MD 07/16/2016 1:48:32 PM This report has been signed electronically. Number of Addenda: 0

## 2016-07-16 NOTE — Transfer of Care (Signed)
Immediate Anesthesia Transfer of Care Note  Patient: Francisco Charles  Procedure(s) Performed: Procedure(s): ENDOSCOPIC RETROGRADE CHOLANGIOPANCREATOGRAPHY (ERCP) (N/A)  Patient Location: PACU  Anesthesia Type:General  Level of Consciousness: Patient easily awoken, sedated, comfortable, cooperative, following commands, responds to stimulation.   Airway & Oxygen Therapy: Patient spontaneously breathing, ventilating well, oxygen via simple oxygen mask.  Post-op Assessment: Report given to PACU RN, vital signs reviewed and stable, moving all extremities.   Post vital signs: Reviewed and stable.  Complications: No apparent anesthesia complications

## 2016-07-16 NOTE — H&P (View-Only) (Signed)
Reason for Consult: Obstructive jaundice and pancreatic neck mass Referring Physician: [facility]  Harless Nakayama HPI: This is a 65 year old male with a PMH of HTN and arthritis transferred from [facility] for further evaluation and treatment of an obstructive jaundice.  Recently the patient complained about RUQ pain and anorexia.  One week ago he was having difficulty with tolerating PO and abdominal pain, mostly in the right upper quadrant.  He was thought to have an UTI and emperic treatment with antibiotics were pursued, however, his symptoms did not improve.  He subsequently presented to [facility] ER and a CT scan was performed.  He was identified to have a 2 cm lesion in the superior margin of the pancreatic neck with some soft tissue extension into the porta hepatis.  There is a focal stricture in the upper third of the CBD and diffuse intrahepatic biliary ductal dilation.  Additionally, there is a loss of the fat plane in the gallbladder fossa and a gallbladder cancer cannot be excluded.  He was admitted to [facility] for further treatment, but Dr. Allen Norris was out of town until next week.  Past Medical History:  Diagnosis Date  . Carpal tunnel syndrome   . History of measles   . Hyperplastic colonic polyp 2006   Excised Dr. Tiffany Kocher  . Hypertension   . Osteoarthrosis   . Prostate nodule     Past Surgical History:  Procedure Laterality Date  . JOINT REPLACEMENT Left 10-31-2000  . PROSTATE SURGERY  09/23/2011   Prostate Biopsy Dr. Jacqlyn Larsen  . TOTAL HIP ARTHROPLASTY Left 2001  . TOTAL HIP REVISION Left 07/23/2013   Procedure: LEFT TOTAL HIP REVISION ARTHROPLASTY WITH BONE GRAFT;  Surgeon: Mauri Pole, MD;  Location: [facility] ORS;  Service: Orthopedics;  Laterality: Left;    Family History  Problem Relation Age of Onset  . Heart attack Mother   . Cancer Neg Hx   . Diabetes Neg Hx   . Heart disease Neg Hx     Social History:  reports that he has been smoking Cigarettes.  He has  a 15.00 pack-year smoking history. He has never used smokeless tobacco. He reports that he does not drink alcohol or use drugs.  Allergies: No Known Allergies  Medications:  Scheduled: . feeding supplement  1 Container Oral TID BM  . heparin  5,000 Units Subcutaneous Q8H   Continuous: . dextrose 5 % and 0.45 % NaCl with KCl 40 mEq/L      No results found for this or any previous visit (from the past 24 hour(s)).   Ct Abdomen Pelvis W Contrast  Result Date: 07/13/2016 CLINICAL DATA:  65 year old male with right abdominal pain, most prominent in the right lower quadrant. Elevated liver function tests. Postprandial nausea. History of prostate biopsy. EXAM: CT ABDOMEN AND PELVIS WITH CONTRAST TECHNIQUE: Multidetector CT imaging of the abdomen and pelvis was performed using the standard protocol following bolus administration of intravenous contrast. CONTRAST:  140mL ISOVUE-300 IOPAMIDOL (ISOVUE-300) INJECTION 61% COMPARISON:  09/29/2012 bone protocol CT pelvis. No prior CT abdomen/pelvis. FINDINGS: Lower chest: No significant pulmonary nodules or acute consolidative airspace disease. Hepatobiliary: Normal liver size. Simple 0.8 cm liver cyst in segment 8 of the right liver lobe. No additional discrete liver lesions. There is slightly irregular annular wall thickening and mucosal hyperenhancement in the fundal gallbladder wall, with loss of the normal fat plane between the fundal gallbladder wall and liver (series 2/ image 32). No radiopaque cholelithiasis. No significant gallbladder  distention. No pericholecystic fluid. There is moderate diffuse intrahepatic biliary ductal dilatation. There is a focal stricture in the upper third of the common bile duct with associated common bile duct wall thickening and hyperenhancement (best seen on coronal series 5/ image 46). Pancreas: No pancreatic duct dilation. There is a heterogeneous hypodense 2.1 x 1.5 cm mass at the posterior superior margin of the  pancreatic neck (series 2/ image 29), which demonstrates ill-defined margins with infiltrative soft tissue extending into the porta hepatis along the common bile duct. Spleen: Normal size. No mass. Adrenals/Urinary Tract: There is irregular thickening of the bilateral adrenal glands, with the suggestion of small adrenal nodules bilaterally measuring 1.0 cm on the right (series 2/ image 24) and 1.3 cm on the left (series 2/ image 25). No hydronephrosis. Simple 2.0 cm interpolar left renal cyst. Simple 1.0 cm lower right renal cyst. No additional renal lesions. Limited visualization of the collapsed bladder due to streak artifact from left hip hardware with no gross bladder abnormality . Stomach/Bowel: Grossly normal stomach. Normal caliber small bowel with no small bowel wall thickening. Normal appendix . Mild sigmoid diverticulosis, with no large bowel wall thickening or pericolonic fat stranding. Vascular/Lymphatic: Atherosclerotic nonaneurysmal abdominal aorta. Patent portal, splenic, hepatic and renal veins. No pathologically enlarged lymph nodes in the abdomen or pelvis. Reproductive: Poorly visualized prostate due to streak artifact from the left hip hardware. Prostate appears mildly enlarged. Other: No pneumoperitoneum. No ascites. No focal fluid collection. There is a heterogeneously enhancing 1.5 x 1.4 cm left paracolic gutter peritoneal nodule (series 2/image 44). Musculoskeletal: There is widespread patchy sclerosis throughout the visualized thoracolumbar spine, sacrum, bilateral iliac bones and lower ribs bilaterally. Mild thoracolumbar spondylosis. Partially visualized left total hip arthroplasty. IMPRESSION: 1. Moderate diffuse intrahepatic biliary ductal dilatation. 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 2. Hypodense 2.1 cm mass at the posterior superior margin of the pancreatic neck with associated infiltrative soft tissue extending into the porta hepatis. No main  pancreatic duct dilation. This mass could represent a primary pancreatic adenocarcinoma or malignant peripancreatic lymph node. 3. Irregular annular wall thickening and hyperenhancement in the fundal gallbladder with loss of the normal fat plane between the fundal gallbladder and liver. Cannot exclude gallbladder carcinoma or serosal carcinomatosis in this location. 4. Left paracolic gutter peritoneal nodule suspicious for peritoneal carcinomatosis. 5. Suggestion of small bilateral adrenal nodules, cannot exclude adrenal metastases. 6. MRI of the abdomen with MRCP without and with IV contrast may be useful for further evaluation of the above findings. 7. 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 Recommend correlation with serum PSA. 8. Additional findings include aortic atherosclerosis and mild sigmoid diverticulosis. Electronically Signed   By: Ilona Sorrel M.D.   On: 07/13/2016 18:21   Mr 3d Recon At Scanner  Result Date: 07/14/2016 CLINICAL DATA:  Common bile duct stricture. EXAM: MRI ABDOMEN WITHOUT AND WITH CONTRAST (INCLUDING MRCP) TECHNIQUE: Multiplanar multisequence MR imaging of the abdomen was performed both before and after the administration of intravenous contrast. Heavily T2-weighted images of the biliary and pancreatic ducts were obtained, and three-dimensional MRCP images were rendered by post processing. CONTRAST:  40mL MULTIHANCE GADOBENATE DIMEGLUMINE 529 MG/ML IV SOLN COMPARISON:  CT 07/13/2016 has high FINDINGS: Lower chest:  Lung bases are clear. Hepatobiliary: There is intrahepatic and extrahepatic biliary duct dilatation. Stricturing of the bile duct at the junction of the common hepatic duct and common bile duct (image 12, series 9).  More distal stricturing of the common hepatic duct (image 13, series 9). Both the strictures occur over approximately 1 cm segment. The common bile duct appears normal caliber at the  ampullary level. Within the hepatic parenchyma. The bile ducts have a mild beaded appearance. There is suggestion of sludge within the bile ducts of the RIGHT hepatic lobe (image 17, series 3 image 16 series 3). There is a cholesterol gallstone within the fundus of the gallbladder seen on the precontrast T1 weighted imaging (series 14). Pancreas: Along the ventral aspect of the pancreatic head there is a lesion which is either of the pancreas or adjacent the pancreas which is hypointense to normal pancreatic parenchyma measuring 20 mm image 35, series 4. This lesion is hypo enhancing on the arterial phase (image 34, series 15). Favor lesion to be and within the pancreatic parenchyma. There is no significant pancreatic duct dilatation. Spleen: Normal spleen. Adrenals/urinary tract: Thickening and adrenal glands which is incompletely characterized. Stomach/Bowel: Stomach and limited of the small bowel is unremarkable Vascular/Lymphatic: Abdominal aortic normal caliber. No retroperitoneal periportal lymphadenopathy. Musculoskeletal: Heterogeneous marrow signal within the entirety of the spine (image 19, series 189) para IMPRESSION: 1. Intrahepatic and extrahepatic duct dilatation with segments of bile duct stricturing in the common hepatic duct and common bile duct. Additionally there is mild beaded appearance of the intrahepatic ducts and cholestasis in the RIGHT hepatic lobe. Differential considerations include cholangiocarcinoma of the extrahepatic ducts, occult malignant stricturing of the extrahepatic ducts and primary sclerosing cholangitis with or without associated cholangiocarcinoma. 2. Lesion within or adjacent to the pancreatic head concerning for pancreatic neoplasm versus a malignant lymph node. Recommend endoscopic ultrasound with tissue sampling. Recommend tissue sampling of the extrahepatic ducts at same time. 3. Cholesterol gallstone within fundus of the gallbladder. 4. Heterogeneous marrow signal  throughout the spine is concerning for skeletal metastasis. Electronically Signed   By: Suzy Bouchard M.D.   On: 07/14/2016 11:21   Mr Jeananne Rama W/wo Cm/mrcp  Result Date: 07/14/2016 CLINICAL DATA:  Common bile duct stricture. EXAM: MRI ABDOMEN WITHOUT AND WITH CONTRAST (INCLUDING MRCP) TECHNIQUE: Multiplanar multisequence MR imaging of the abdomen was performed both before and after the administration of intravenous contrast. Heavily T2-weighted images of the biliary and pancreatic ducts were obtained, and three-dimensional MRCP images were rendered by post processing. CONTRAST:  43mL MULTIHANCE GADOBENATE DIMEGLUMINE 529 MG/ML IV SOLN COMPARISON:  CT 07/13/2016 has high FINDINGS: Lower chest:  Lung bases are clear. Hepatobiliary: There is intrahepatic and extrahepatic biliary duct dilatation. Stricturing of the bile duct at the junction of the common hepatic duct and common bile duct (image 12, series 9). More distal stricturing of the common hepatic duct (image 13, series 9). Both the strictures occur over approximately 1 cm segment. The common bile duct appears normal caliber at the ampullary level. Within the hepatic parenchyma. The bile ducts have a mild beaded appearance. There is suggestion of sludge within the bile ducts of the RIGHT hepatic lobe (image 17, series 3 image 16 series 3). There is a cholesterol gallstone within the fundus of the gallbladder seen on the precontrast T1 weighted imaging (series 14). Pancreas: Along the ventral aspect of the pancreatic head there is a lesion which is either of the pancreas or adjacent the pancreas which is hypointense to normal pancreatic parenchyma measuring 20 mm image 35, series 4. This lesion is hypo enhancing on the arterial phase (image 34, series 15). Favor lesion to be and within the pancreatic parenchyma. There is no significant  pancreatic duct dilatation. Spleen: Normal spleen. Adrenals/urinary tract: Thickening and adrenal glands which is incompletely  characterized. Stomach/Bowel: Stomach and limited of the small bowel is unremarkable Vascular/Lymphatic: Abdominal aortic normal caliber. No retroperitoneal periportal lymphadenopathy. Musculoskeletal: Heterogeneous marrow signal within the entirety of the spine (image 19, series 189) para IMPRESSION: 1. Intrahepatic and extrahepatic duct dilatation with segments of bile duct stricturing in the common hepatic duct and common bile duct. Additionally there is mild beaded appearance of the intrahepatic ducts and cholestasis in the RIGHT hepatic lobe. Differential considerations include cholangiocarcinoma of the extrahepatic ducts, occult malignant stricturing of the extrahepatic ducts and primary sclerosing cholangitis with or without associated cholangiocarcinoma. 2. Lesion within or adjacent to the pancreatic head concerning for pancreatic neoplasm versus a malignant lymph node. Recommend endoscopic ultrasound with tissue sampling. Recommend tissue sampling of the extrahepatic ducts at same time. 3. Cholesterol gallstone within fundus of the gallbladder. 4. 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 Electronically Signed   By: Suzy Bouchard M.D.   On: 07/14/2016 11:21    ROS:  As stated above in the HPI otherwise negative.  Blood pressure (!) 176/88, pulse 60, temperature 98 F (36.7 C), temperature source Oral, resp. rate 18, SpO2 100 %.    PE: Gen: NAD, Alert and Oriented HEENT:  [place]/AT, EOMI Neck: Supple, no LAD Lungs: CTA Bilaterally CV: RRR without M/G/R ABM: Soft, mild abdominal pain, +BS Ext: No C/C/E  Assessment/Plan: 1) Pancreatic neck mass. 2) ? Gallbladder cancer. 3) Obstructive jaundice.   With the current findings further evaluation and treatment with an ERCP with stenting is required.  Once the stenting is completed he will require an EUS with FNA as an outpatient for further evaluation.  He is noted to have some possible peritoneal  spread.  Plan: 1) ERCP with stenting tomorrow. 2) EUS with FNA in the near future. 3) Check Ca 19-9.  Suleika Donavan D 07/15/2016, 3:40 PM

## 2016-07-16 NOTE — Anesthesia Postprocedure Evaluation (Signed)
Anesthesia Post Note  Patient: Francisco Charles  Procedure(s) Performed: Procedure(s) (LRB): ENDOSCOPIC RETROGRADE CHOLANGIOPANCREATOGRAPHY (ERCP) (N/A)  Patient location during evaluation: PACU Anesthesia Type: General Level of consciousness: awake and alert Pain management: pain level controlled Vital Signs Assessment: post-procedure vital signs reviewed and stable Respiratory status: spontaneous breathing, nonlabored ventilation and respiratory function stable Cardiovascular status: blood pressure returned to baseline and stable Postop Assessment: no signs of nausea or vomiting Anesthetic complications: no    Last Vitals:  Vitals:   07/16/16 1356 07/16/16 1400  BP:  (!) 165/90  Pulse: 83   Resp: 13   Temp: 36.6 C     Last Pain:  Vitals:   07/16/16 1356  TempSrc: Oral  PainSc:                  Xcaret Morad,W. EDMOND

## 2016-07-17 ENCOUNTER — Encounter (HOSPITAL_COMMUNITY)
Admission: AD | Disposition: A | Discharge status: Home / Self Care | Payer: Self-pay | Source: Other Acute Inpatient Hospital | Attending: Internal Medicine

## 2016-07-17 ENCOUNTER — Inpatient Hospital Stay (HOSPITAL_COMMUNITY): Payer: No Typology Code available for payment source

## 2016-07-17 ENCOUNTER — Encounter (HOSPITAL_COMMUNITY): Payer: Self-pay | Admitting: Interventional Radiology

## 2016-07-17 ENCOUNTER — Encounter (HOSPITAL_COMMUNITY): Payer: Self-pay | Admitting: Anesthesiology

## 2016-07-17 HISTORY — PX: IR GENERIC HISTORICAL: IMG1180011

## 2016-07-17 LAB — COMPREHENSIVE METABOLIC PANEL
ALK PHOS: 749 U/L — AB (ref 38–126)
ALT: 204 U/L — AB (ref 17–63)
ANION GAP: 7 (ref 5–15)
AST: 115 U/L — ABNORMAL HIGH (ref 15–41)
Albumin: 2.9 g/dL — ABNORMAL LOW (ref 3.5–5.0)
BUN: 9 mg/dL (ref 6–20)
CALCIUM: 9.5 mg/dL (ref 8.9–10.3)
CO2: 28 mmol/L (ref 22–32)
CREATININE: 0.53 mg/dL — AB (ref 0.61–1.24)
Chloride: 99 mmol/L — ABNORMAL LOW (ref 101–111)
Glucose, Bld: 99 mg/dL (ref 65–99)
Potassium: 3.7 mmol/L (ref 3.5–5.1)
SODIUM: 134 mmol/L — AB (ref 135–145)
TOTAL PROTEIN: 6.2 g/dL — AB (ref 6.5–8.1)
Total Bilirubin: 6.7 mg/dL — ABNORMAL HIGH (ref 0.3–1.2)

## 2016-07-17 LAB — PROTIME-INR
INR: 1.02
Prothrombin Time: 13.4 seconds (ref 11.4–15.2)

## 2016-07-17 LAB — CANCER ANTIGEN 19-9: CA 19 9: 306 U/mL — AB (ref 0–35)

## 2016-07-17 SURGERY — ERCP, WITH INTERVENTION IF INDICATED
Anesthesia: General

## 2016-07-17 MED ORDER — PIPERACILLIN-TAZOBACTAM 3.375 G IVPB
3.3750 g | Freq: Once | INTRAVENOUS | Status: AC
  Administered 2016-07-17: 3.375 g via INTRAVENOUS
  Filled 2016-07-17: qty 50

## 2016-07-17 MED ORDER — IOPAMIDOL (ISOVUE-300) INJECTION 61%
25.0000 mL | Freq: Once | INTRAVENOUS | Status: AC | PRN
  Administered 2016-07-17: 25 mL

## 2016-07-17 MED ORDER — MIDAZOLAM HCL 2 MG/2ML IJ SOLN
INTRAMUSCULAR | Status: AC
  Filled 2016-07-17: qty 6

## 2016-07-17 MED ORDER — LIDOCAINE HCL 1 % IJ SOLN
INTRAMUSCULAR | Status: AC
  Filled 2016-07-17: qty 20

## 2016-07-17 MED ORDER — MIDAZOLAM HCL 2 MG/2ML IJ SOLN
INTRAMUSCULAR | Status: AC | PRN
  Administered 2016-07-17 (×2): 1 mg via INTRAVENOUS

## 2016-07-17 MED ORDER — ENOXAPARIN SODIUM 40 MG/0.4ML ~~LOC~~ SOLN: 40.0000 mg | Freq: Every day | SUBCUTANEOUS | Status: DC

## 2016-07-17 MED ORDER — LIDOCAINE HCL 1 % IJ SOLN
INTRAMUSCULAR | Status: AC | PRN
  Administered 2016-07-17: 5 mL

## 2016-07-17 MED ORDER — FENTANYL CITRATE (PF) 100 MCG/2ML IJ SOLN
INTRAMUSCULAR | Status: AC
  Filled 2016-07-17: qty 6

## 2016-07-17 MED ORDER — FENTANYL CITRATE (PF) 100 MCG/2ML IJ SOLN
INTRAMUSCULAR | Status: AC | PRN
  Administered 2016-07-17: 100 ug via INTRAVENOUS
  Administered 2016-07-17 (×2): 50 ug via INTRAVENOUS

## 2016-07-17 NOTE — Procedures (Signed)
S/p RT 10FR BILIARY INT/EXT DRAIN  NO COMP  STABLE  BILE SENT  FULL REPORT IN PACS

## 2016-07-17 NOTE — Consult Note (Signed)
Chief Complaint: Patient was seen in consultation today for percutaneous transhepatic cholangiogram with biliary drainage  Referring Physician(s): Hung,P  Supervising Physician: Daryll Brod  Patient Status: Inpatient  History of Present Illness: Francisco Charles is a 65 y.o. male with history of hypertension, osteoarthrosis who was recently admitted to [facility] on 8/14 for worsening abdominal pain, weight loss and anorexia. CT revealed diffuse intrahepatic biliary ductal dilatation with a malignant-appearing stricture in the upper third of the common bile duct, 2.1 cm mass at the posterior superior margin of the pancreatic neck with associated soft tissue infiltration into the porta hepatis, annular wall thickening and hyperenhancement in the fundal gallbladder with loss of normal fat plane, left paracolic gutter peritoneal nodule, suggestion of small bilateral adrenal nodules, and widespread patchy sclerosis throughout the visualized skeleton. MRI of the abdomen revealed intrahepatic and extrahepatic ductal dilatation with segments of bile duct stricturing of the common hepatic and common bile duct. There was a  beaded appearance of intrahepatic ducts and cholestasis in the right hepatic lobe. There was a lesion within or adjacent to the pancreatic head. He was subsequently transferred to [facility] for further evaluation secondary to GI physician being out of town until next week. Laboratory values revealed elevated LFTs with a current total bilirubin of 6.7, CA 19-9 of 306, normal WBC/ normal hemoglobin/ platelet count/ normal creatinine, PT 13.4, INR 1.02. On 8/18 the patient underwent ERCP with biliary sphincterotomy. Common bile duct could not be cannulated. Request now received from GI service for PTC with biliary drainage.  Past Medical History:  Diagnosis Date  . Carpal tunnel syndrome   . History of measles   . Hyperplastic colonic polyp 2006   Excised Dr. Tiffany Kocher  . Hypertension   . Osteoarthrosis   . Prostate nodule     Past Surgical History:  Procedure Laterality Date  . JOINT REPLACEMENT Left 10-31-2000  . PROSTATE SURGERY  09/23/2011   Prostate Biopsy Dr. Jacqlyn Larsen  . TOTAL HIP ARTHROPLASTY Left 2001  . TOTAL HIP REVISION Left 07/23/2013   Procedure: LEFT TOTAL HIP REVISION ARTHROPLASTY WITH BONE GRAFT;  Surgeon: Mauri Pole, MD;  Location: [facility];  Service: Orthopedics;  Laterality: Left;    Allergies: Review of patient's allergies indicates no known allergies.  Medications: Prior to Admission medications   Medication Sig Start Date End Date Taking? Authorizing Provider  aspirin EC 81 MG tablet Take 81 mg by mouth daily.   Yes Historical Provider, MD  omeprazole (PRILOSEC) 20 MG capsule Take 20 mg by mouth daily.  07/10/16  Yes Historical Provider, MD  quinapril-hydrochlorothiazide (ACCURETIC) 20-12.5 MG tablet TAKE 1 TABLET BY MOUTH DAILY. 05/15/16  Yes Birdie Sons, MD  feeding supplement (BOOST / RESOURCE BREEZE) LIQD Take 1 Container by mouth 3 (three) times daily between meals. Patient not taking: Reported on 07/15/2016 07/15/16   Fritzi Mandes, MD     Family History  Problem Relation Age of Onset  . Heart attack Mother   . Cancer Neg Hx   . Diabetes Neg Hx   . Heart disease Neg Hx     Social History   Social History  . Marital status: Married    Spouse name: N/A  . Number of children: 1  . Years of education: N/A   Occupational History  . Employed     Works full time with [employer] Topics  . Smoking status: Current Every Day  Smoker    Packs/day: 0.50    Years: 30.00    Types: Cigarettes    Last attempt to quit: 11/29/2012  . Smokeless tobacco: Never Used  . Alcohol use No  . Drug use: No  . Sexual activity: Yes   Other Topics Concern  . None   Social History Narrative  . None      Review of Systems see above; patient currently denies fever,  headache, chest pain, dyspnea, cough, nausea, vomiting or abnormal bleeding. He does have occasional sweats, abdominal and back discomfort, some dysphagia with solids as well as reflux  Vital Signs: BP (!) 162/91 (BP Location: Right Arm)   Pulse 65   Temp 97.6 F (36.4 C) (Oral)   Resp 16   Ht _0  (1.702 m)   Wt 158 lb 14.4 oz (72.1 kg)   SpO2 95%   BMI 24.89 kg/m   Physical Exam patient awake, alert. Chest clear to auscultation bilaterally. Heart with regular rate and rhythm. Abdomen soft, positive bowel sounds, mild right upper/epigastric tenderness to palpation; lower extremities with no edema.  Mallampati Score:  MD Evaluation Airway: WNL Heart: WNL Abdomen: WNL Chest/ Lungs: WNL ASA  Classification: 3 Mallampati/Airway Score: Two  Imaging: Ct Abdomen Pelvis W Contrast  Result Date: 07/13/2016 CLINICAL DATA:  65 year old male with right abdominal pain, most prominent in the right lower quadrant. Elevated liver function tests. Postprandial nausea. History of prostate biopsy. EXAM: CT ABDOMEN AND PELVIS WITH CONTRAST TECHNIQUE: Multidetector CT imaging of the abdomen and pelvis was performed using the standard protocol following bolus administration of intravenous contrast. CONTRAST:  167m ISOVUE-300 IOPAMIDOL (ISOVUE-300) INJECTION 61% COMPARISON:  09/29/2012 bone protocol CT pelvis. No prior CT abdomen/pelvis. FINDINGS: Lower chest: No significant pulmonary nodules or acute consolidative airspace disease. Hepatobiliary: Normal liver size. Simple 0.8 cm liver cyst in segment 8 of the right liver lobe. No additional discrete liver lesions. There is slightly irregular annular wall thickening and mucosal hyperenhancement in the fundal gallbladder wall, with loss of the normal fat plane between the fundal gallbladder wall and liver (series 2/ image 32). No radiopaque cholelithiasis. No significant gallbladder distention. No pericholecystic fluid. There is moderate diffuse intrahepatic  biliary ductal dilatation. There is a focal stricture in the upper third of the common bile duct with associated common bile duct wall thickening and hyperenhancement (best seen on coronal series 5/ image 46). Pancreas: No pancreatic duct dilation. There is a heterogeneous hypodense 2.1 x 1.5 cm mass at the posterior superior margin of the pancreatic neck (series 2/ image 29), which demonstrates ill-defined margins with infiltrative soft tissue extending into the porta hepatis along the common bile duct. Spleen: Normal size. No mass. Adrenals/Urinary Tract: There is irregular thickening of the bilateral adrenal glands, with the suggestion of small adrenal nodules bilaterally measuring 1.0 cm on the right (series 2/ image 24) and 1.3 cm on the left (series 2/ image 25). No hydronephrosis. Simple 2.0 cm interpolar left renal cyst. Simple 1.0 cm lower right renal cyst. No additional renal lesions. Limited visualization of the collapsed bladder due to streak artifact from left hip hardware with no gross bladder abnormality . Stomach/Bowel: Grossly normal stomach. Normal caliber small bowel with no small bowel wall thickening. Normal appendix . Mild sigmoid diverticulosis, with no large bowel wall thickening or pericolonic fat stranding. Vascular/Lymphatic: Atherosclerotic nonaneurysmal abdominal aorta. Patent portal, splenic, hepatic and renal veins. No pathologically enlarged lymph nodes in the abdomen or pelvis. Reproductive: Poorly visualized prostate due to streak  artifact from the left hip hardware. Prostate appears mildly enlarged. Other: No pneumoperitoneum. No ascites. No focal fluid collection. There is a heterogeneously enhancing 1.5 x 1.4 cm left paracolic gutter peritoneal nodule (series 2/image 44). Musculoskeletal: There is widespread patchy sclerosis throughout the visualized thoracolumbar spine, sacrum, bilateral iliac bones and lower ribs bilaterally. Mild thoracolumbar spondylosis. Partially visualized  left total hip arthroplasty. IMPRESSION: 1. Moderate diffuse intrahepatic biliary ductal dilatation. 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 2. Hypodense 2.1 cm mass at the posterior superior margin of the pancreatic neck with associated infiltrative soft tissue extending into the porta hepatis. No main pancreatic duct dilation. This mass could represent a primary pancreatic adenocarcinoma or malignant peripancreatic lymph node. 3. Irregular annular wall thickening and hyperenhancement in the fundal gallbladder with loss of the normal fat plane between the fundal gallbladder and liver. Cannot exclude gallbladder carcinoma or serosal carcinomatosis in this location. 4. Left paracolic gutter peritoneal nodule suspicious for peritoneal carcinomatosis. 5. Suggestion of small bilateral adrenal nodules, cannot exclude adrenal metastases. 6. MRI of the abdomen with MRCP without and with IV contrast may be useful for further evaluation of the above findings. 7. 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 Recommend correlation with serum PSA. 8. Additional findings include aortic atherosclerosis and mild sigmoid diverticulosis. Electronically Signed   By: Ilona Sorrel M.D.   On: 07/13/2016 18:21   Mr 3d Recon At Scanner  Result Date: 07/14/2016 CLINICAL DATA:  Common bile duct stricture. EXAM: MRI ABDOMEN WITHOUT AND WITH CONTRAST (INCLUDING MRCP) TECHNIQUE: Multiplanar multisequence MR imaging of the abdomen was performed both before and after the administration of intravenous contrast. Heavily T2-weighted images of the biliary and pancreatic ducts were obtained, and three-dimensional MRCP images were rendered by post processing. CONTRAST:  58m MULTIHANCE GADOBENATE DIMEGLUMINE 529 MG/ML IV SOLN COMPARISON:  CT 07/13/2016 has high FINDINGS: Lower chest:  Lung bases are clear. Hepatobiliary: There is intrahepatic and  extrahepatic biliary duct dilatation. Stricturing of the bile duct at the junction of the common hepatic duct and common bile duct (image 12, series 9). More distal stricturing of the common hepatic duct (image 13, series 9). Both the strictures occur over approximately 1 cm segment. The common bile duct appears normal caliber at the ampullary level. Within the hepatic parenchyma. The bile ducts have a mild beaded appearance. There is suggestion of sludge within the bile ducts of the RIGHT hepatic lobe (image 17, series 3 image 16 series 3). There is a cholesterol gallstone within the fundus of the gallbladder seen on the precontrast T1 weighted imaging (series 14). Pancreas: Along the ventral aspect of the pancreatic head there is a lesion which is either of the pancreas or adjacent the pancreas which is hypointense to normal pancreatic parenchyma measuring 20 mm image 35, series 4. This lesion is hypo enhancing on the arterial phase (image 34, series 15). Favor lesion to be and within the pancreatic parenchyma. There is no significant pancreatic duct dilatation. Spleen: Normal spleen. Adrenals/urinary tract: Thickening and adrenal glands which is incompletely characterized. Stomach/Bowel: Stomach and limited of the small bowel is unremarkable Vascular/Lymphatic: Abdominal aortic normal caliber. No retroperitoneal periportal lymphadenopathy. Musculoskeletal: Heterogeneous marrow signal within the entirety of the spine (image 19, series 189) para IMPRESSION: 1. Intrahepatic and extrahepatic duct dilatation with segments of bile duct stricturing in the common hepatic duct and common bile duct. Additionally there is mild beaded appearance of the intrahepatic ducts and  cholestasis in the RIGHT hepatic lobe. Differential considerations include cholangiocarcinoma of the extrahepatic ducts, occult malignant stricturing of the extrahepatic ducts and primary sclerosing cholangitis with or without associated  cholangiocarcinoma. 2. Lesion within or adjacent to the pancreatic head concerning for pancreatic neoplasm versus a malignant lymph node. Recommend endoscopic ultrasound with tissue sampling. Recommend tissue sampling of the extrahepatic ducts at same time. 3. Cholesterol gallstone within fundus of the gallbladder. 4. 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 Electronically Signed   By: Suzy Bouchard M.D.   On: 07/14/2016 11:21   Dg C-arm 61-120 Min-no Report  Result Date: 07/16/2016 CLINICAL DATA: stricture  C-ARM 61-120 MINUTES Fluoroscopy was utilized by the requesting physician.  No radiographic interpretation.   Mr Abd W/wo Cm/mrcp  Result Date: 07/14/2016 CLINICAL DATA:  Common bile duct stricture. EXAM: MRI ABDOMEN WITHOUT AND WITH CONTRAST (INCLUDING MRCP) TECHNIQUE: Multiplanar multisequence MR imaging of the abdomen was performed both before and after the administration of intravenous contrast. Heavily T2-weighted images of the biliary and pancreatic ducts were obtained, and three-dimensional MRCP images were rendered by post processing. CONTRAST:  33m MULTIHANCE GADOBENATE DIMEGLUMINE 529 MG/ML IV SOLN COMPARISON:  CT 07/13/2016 has high FINDINGS: Lower chest:  Lung bases are clear. Hepatobiliary: There is intrahepatic and extrahepatic biliary duct dilatation. Stricturing of the bile duct at the junction of the common hepatic duct and common bile duct (image 12, series 9). More distal stricturing of the common hepatic duct (image 13, series 9). Both the strictures occur over approximately 1 cm segment. The common bile duct appears normal caliber at the ampullary level. Within the hepatic parenchyma. The bile ducts have a mild beaded appearance. There is suggestion of sludge within the bile ducts of the RIGHT hepatic lobe (image 17, series 3 image 16 series 3). There is a cholesterol gallstone within the fundus of the gallbladder seen on the  precontrast T1 weighted imaging (series 14). Pancreas: Along the ventral aspect of the pancreatic head there is a lesion which is either of the pancreas or adjacent the pancreas which is hypointense to normal pancreatic parenchyma measuring 20 mm image 35, series 4. This lesion is hypo enhancing on the arterial phase (image 34, series 15). Favor lesion to be and within the pancreatic parenchyma. There is no significant pancreatic duct dilatation. Spleen: Normal spleen. Adrenals/urinary tract: Thickening and adrenal glands which is incompletely characterized. Stomach/Bowel: Stomach and limited of the small bowel is unremarkable Vascular/Lymphatic: Abdominal aortic normal caliber. No retroperitoneal periportal lymphadenopathy. Musculoskeletal: Heterogeneous marrow signal within the entirety of the spine (image 19, series 189) para IMPRESSION: 1. Intrahepatic and extrahepatic duct dilatation with segments of bile duct stricturing in the common hepatic duct and common bile duct. Additionally there is mild beaded appearance of the intrahepatic ducts and cholestasis in the RIGHT hepatic lobe. Differential considerations include cholangiocarcinoma of the extrahepatic ducts, occult malignant stricturing of the extrahepatic ducts and primary sclerosing cholangitis with or without associated cholangiocarcinoma. 2. Lesion within or adjacent to the pancreatic head concerning for pancreatic neoplasm versus a malignant lymph node. Recommend endoscopic ultrasound with tissue sampling. Recommend tissue sampling of the extrahepatic ducts at same time. 3. Cholesterol gallstone within fundus of the gallbladder. 4. 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 Electronically Signed   By: SSuzy BouchardM.D.   On: 07/14/2016 11:21    Labs:  CBC:  Recent Labs  07/13/16 1335 07/14/16 0405 07/15/16 1623 07/16/16 0519  WBC 10.2 10.0  10.3 9.8  HGB 17.9 16.1 15.0 15.5  HCT 52.2* 48.2  43.0 44.5  PLT 254 209 235 244    COAGS:  Recent Labs  07/17/16 1217  INR 1.02    BMP:  Recent Labs  07/14/16 0405 07/15/16 1623 07/16/16 0519 07/17/16 1217  NA 135 136 133* 134*  K 3.4* 3.1* 3.6 3.7  CL 98* 101 98* 99*  CO2 _0 GLUCOSE 99 133* 123* 99  BUN _1 CALCIUM 9.5 9.3 9.4 9.5  CREATININE 0.76 0.46* 0.60* 0.53*  GFRNONAA >60 >60 >60 >60  GFRAA >60 >60 >60 >60    LIVER FUNCTION TESTS:  Recent Labs  07/21/15 0821 07/12/16 1510 07/13/16 1335 07/17/16 1217  BILITOT  --  5.7* 6.7* 6.7*  AST  --  246* 203* 115*  ALT  --  351* 323* 204*  ALKPHOS  --  921* 896* 749*  PROT  --  7.7 8.2* 6.2*  ALBUMIN 4.4 4.4 4.0 2.9*    TUMOR MARKERS:  Recent Labs  07/13/16 1932 07/14/16 1443 07/16/16 0519  AFPTM  --  7.5  --   CEA 7.2*  --   --   CA199 289*  --  306*    Assessment and Plan: Patient with history of abdominal pain, obstructive jaundice/biliary obstruction, 2 cm pancreatic mass, elevated LFTs,/ CA19-9; unable to cannulate common bile duct on recent ERCP. Request now received from  GI service for PTC with biliary drainage. Imaging studies were reviewed by Dr. Annamaria Boots and case discussed with Dr. Benson Norway. Details/risks of procedure, including but not limited to, internal bleeding, infection, injury to adjacent structures, discussed with patient and wife with their understanding and consent. Procedure is tentatively scheduled for later today or tomorrow.   Thank you for this interesting consult.  I greatly enjoyed meeting ZAHMIR LALLA and look forward to participating in their care.  A copy of this report was sent to the requesting provider on this date.  Electronically Signed: D. Rowe Robert 07/17/2016, 2:54 PM      Approximately 40 minutes were spent in face to face in clinical consultation, greater than 50% of which was counseling/coordinating care for percutaneous transhepatic cholangiogram with biliary drainage

## 2016-07-17 NOTE — Progress Notes (Signed)
PROGRESS NOTE    Francisco Charles  [id number] DOB: 1951/01/22 DOA: 07/15/2016 PCP: Lelon Huh, MD    Brief Narrative:  65 year old with past medical history of essential hypertension and arthritis that came to [facility] for worsening abdominal pain, unintentional weight loss and ability to eat the last 2 days, imaging was done which showed a pancreatic mass and obstructive jaundice. Once transferred to [facility] as ERCP services could not be provided at [facility], he is been placed nothing by mouth GI has been consulted for possible ERCP with stenting and biopsy.  Assessment & Plan: Pancreatic mass/Obstructive jaundice: Place the patient nothing by mouth,  ERCP attempted on 07/16/2016, no stones were found, sphincterotomy was done but hard to cannulate CBD, GI to try again today stenting. Appreciate GI assistance.  Essential hypertension: Patient seems to be high add hydralazine when necessary as needed. For systolic blood pressure greater than 180.  History of tobacco use: Counseling.    Protein-calorie malnutrition, severe Ensure 3 times a day.     DVT prophylaxis: (lovenox Code Status: (Full Family Communication: Wife Disposition Plan: home hopefully in the afternoon   Consultants:   GI  Procedures:   ERCP on 8.18.2017  Antimicrobials:none    Subjective: No complains ready to get it over with.  Objective: Vitals:   07/16/16 1451 07/16/16 2000 07/16/16 2217 07/17/16 0555  BP: (!) 163/97  (!) 159/81 (!) 156/82  Pulse: 64  63 61  Resp: 16  16 16   Temp: 98.1 F (36.7 C)  97.6 F (36.4 C) 97.6 F (36.4 C)  TempSrc: Oral  Oral Oral  SpO2: 94%  97% 96%  Weight:  72.1 kg (158 lb 14.4 oz)    Height:  5\' 7"  (1.702 m)      Intake/Output Summary (Last 24 hours) at 07/17/16 0821 Last data filed at 07/17/16 0600  Gross per 24 hour  Intake             2600 ml  Output             1250 ml  Net             1350 ml   Filed Weights   07/16/16 2000  Weight:  72.1 kg (158 lb 14.4 oz)    Examination:  General exam: Appears calm and comfortable  Respiratory system: Clear to auscultation. Respiratory effort normal. Cardiovascular system: S1 & S2 heard, RRR.  Gastrointestinal system: Abdomen is nondistended, soft and nontender. No organomegaly or masses felt. Normal bowel sounds heard. Central nervous system: Alert and oriented. No focal neurological deficits. Psychiatry: Judgement and insight appear normal. Mood & affect appropriate.     Data Reviewed: I have personally reviewed following labs and imaging studies  CBC:  Recent Labs Lab 07/12/16 1510 07/13/16 1335 07/14/16 0405 07/15/16 1623 07/16/16 0519  WBC 9.7 10.2 10.0 10.3 9.8  HGB  --  17.9 16.1 15.0 15.5  HCT 51.8* 52.2* 48.2 43.0 44.5  MCV 93 89.7 90.7 87.2 86.7  PLT 260 254 209 235 [id number]   Basic Metabolic Panel:  Recent Labs Lab 07/12/16 1510 07/13/16 1335 07/14/16 0405 07/15/16 1623 07/16/16 0519  NA 135 133* 135 136 133*  K 3.7 3.5 3.4* 3.1* 3.6  CL 88* 93* 98* 101 98*  CO2 26 31 27 26 27   GLUCOSE 105* 115* 99 133* 123*  BUN 8 8 9 10 6   CREATININE 1.06 0.90 0.76 0.46* 0.60*  CALCIUM 10.3* 10.4* 9.5 9.3 9.4   GFR: Estimated Creatinine  Clearance: 88.4 mL/min (by C-G formula based on SCr of 0.8 mg/dL). Liver Function Tests:  Recent Labs Lab 07/12/16 1510 07/13/16 1335  AST 246* 203*  ALT 351* 323*  ALKPHOS 921* 896*  BILITOT 5.7* 6.7*  PROT 7.7 8.2*  ALBUMIN 4.4 4.0    Recent Labs Lab 07/13/16 1335  LIPASE 26   No results for input(s): AMMONIA in the last 168 hours. Coagulation Profile: No results for input(s): INR, PROTIME in the last 168 hours. Cardiac Enzymes: No results for input(s): CKTOTAL, CKMB, CKMBINDEX, TROPONINI in the last 168 hours. BNP (last 3 results) No results for input(s): PROBNP in the last 8760 hours. HbA1C: No results for input(s): HGBA1C in the last 72 hours. CBG: No results for input(s): GLUCAP in the last 168  hours. Lipid Profile: No results for input(s): CHOL, HDL, LDLCALC, TRIG, CHOLHDL, LDLDIRECT in the last 72 hours. Thyroid Function Tests: No results for input(s): TSH, T4TOTAL, FREET4, T3FREE, THYROIDAB in the last 72 hours. Anemia Panel: No results for input(s): VITAMINB12, FOLATE, FERRITIN, TIBC, IRON, RETICCTPCT in the last 72 hours. Sepsis Labs: No results for input(s): PROCALCITON, LATICACIDVEN in the last 168 hours.  No results found for this or any previous visit (from the past 240 hour(s)).       Radiology Studies: Dg C-arm 61-120 Min-no Report  Result Date: 07/16/2016 CLINICAL DATA: stricture  C-ARM 61-120 MINUTES Fluoroscopy was utilized by the requesting physician.  No radiographic interpretation.        Scheduled Meds: . feeding supplement  1 Container Oral TID BM  . heparin  5,000 Units Subcutaneous Q8H   Continuous Infusions: . sodium chloride    . dextrose 5 % and 0.45 % NaCl with KCl 40 mEq/L 75 mL/hr (07/16/16 1502)     LOS: 2 days    Time spent: 15 min    Charlynne Cousins, MD [facility] Pager [phone] If 7PM-7AM, please contact night-coverage [email or web address] Password [email or web address] 07/17/2016, 8:21 AM

## 2016-07-18 MED ORDER — ALUM & MAG HYDROXIDE-SIMETH 200-200-20 MG/5ML PO SUSP
30.0000 mL | ORAL | Status: DC | PRN
  Administered 2016-07-18 – 2016-07-19 (×2): 30 mL via ORAL
  Filled 2016-07-18 (×2): qty 30

## 2016-07-18 NOTE — Progress Notes (Signed)
Subjective: Some abdominal pain from the PTC.  Objective: Vital signs in last 24 hours: Temp:  [97.7 F (36.5 C)-98.4 F (36.9 C)] 98.4 F (36.9 C) (08/20 0503) Pulse Rate:  [59-83] 69 (08/20 0503) Resp:  [13-18] 16 (08/20 0503) BP: (146-180)/(80-113) 146/89 (08/20 0503) SpO2:  [94 %-99 %] 96 % (08/20 0503) Last BM Date: 07/09/16  Intake/Output from previous day: 08/19 0701 - 08/20 0700 In: 2000 [P.O.:200; I.V.:1800] Out: 2675 [Urine:1250; Drains:1425] Intake/Output this shift: Total I/O In: -  Out: 200 [Drains:200]  General appearance: alert and no distress GI: some tenderness at the [facility] site.  Lab Results:  Recent Labs  07/15/16 1623 07/16/16 0519  WBC 10.3 9.8  HGB 15.0 15.5  HCT 43.0 44.5  PLT 235 244   BMET  Recent Labs  07/15/16 1623 07/16/16 0519 07/17/16 1217  NA 136 133* 134*  K 3.1* 3.6 3.7  CL 101 98* 99*  CO2 _0 GLUCOSE 133* 123* 99  BUN _1 CREATININE 0.46* 0.60* 0.53*  CALCIUM 9.3 9.4 9.5   LFT  Recent Labs  07/17/16 1217  PROT 6.2*  ALBUMIN 2.9*  AST 115*  ALT 204*  ALKPHOS 749*  BILITOT 6.7*   PT/INR  Recent Labs  07/17/16 1217  LABPROT 13.4  INR 1.02   Hepatitis Panel No results for input(s): HEPBSAG, HCVAB, HEPAIGM, HEPBIGM in the last 72 hours. C-Diff No results for input(s): CDIFFTOX in the last 72 hours. Fecal Lactopherrin No results for input(s): FECLLACTOFRN in the last 72 hours.  Studies/Results: Dg C-arm 61-120 Min-no Report  Result Date: 07/16/2016 CLINICAL DATA: stricture  C-ARM 61-120 MINUTES Fluoroscopy was utilized by the requesting physician.  No radiographic interpretation.   Ir Int Lianne Cure Biliary Drain With Cholangiogram  Result Date: 07/17/2016 INDICATION: BILIARY OBSTRUCTION, OBSTRUCTIVE JAUNDICE, UNSUCCESSFUL ERCP STENT INSERTION EXAM: IR INT-EXT BILIARY DRAIN W/ CHOLANGIOGRAM MEDICATIONS: 3.375 g Zosyn; The antibiotic was administered within an appropriate time frame prior to the  initiation of the procedure. ANESTHESIA/SEDATION: Moderate (conscious) sedation was employed during this procedure. A total of Versed 2.0 mg and Fentanyl 200 mcg was administered intravenously. Moderate Sedation Time: 30 minutes. The patient's level of consciousness and vital signs were monitored continuously by radiology nursing throughout the procedure under my direct supervision. FLUOROSCOPY TIME:  Fluoroscopy Time: 3 minutes 24 seconds (73 mGy). COMPLICATIONS: None immediate. PROCEDURE: Informed written consent was obtained from the patient after a thorough discussion of the procedural risks, benefits and alternatives. All questions were addressed. Maximal Sterile Barrier Technique was utilized including caps, mask, sterile gowns, sterile gloves, sterile drape, hand hygiene and skin antiseptic. A timeout was performed prior to the initiation of the procedure. Previous imaging reviewed. Under sterile conditions and local anesthesia, ultrasound percutaneous needle access performed of a peripheral right hepatic duct in the mid axillary line through a lower intercostal space. There was return of bile. Contrast injection performed for a cholangiogram. Cholangiogram confirms diffuse biliary obstruction and marked dilatation. 018 guidewire advanced followed by the Accustick dilator set. Outer dilator advanced into the distal CBD. Contrast injection confirms position. Amplatz guidewire advanced into the duodenum. Tract dilatation performed to advance a 10 [place] internal external biliary drain. Retention loop formed in the duodenum. Peripheral marker within the right hepatic duct. Contrast injection confirms drainage of the right and left ducts adequately. Gravity drainage bag connected. Catheter secured with a Prolene suture and a sterile dressing. No immediate complication. Patient tolerated the procedure well. IMPRESSION: Successful ultrasound and fluoroscopic  10 French right internal external biliary drain.  Electronically Signed   By: Jerilynn Mages.  Shick M.D.   On: 07/17/2016 18:27    Medications:  Scheduled: . [START ON 07/19/2016] enoxaparin (LOVENOX) injection  40 mg Subcutaneous Daily  . feeding supplement  1 Container Oral TID BM   Continuous: . 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   The PTC was successful.  I am appreciative for IR intervention.  I will plan on an EUS with FNA tomorrow.  It is very frustrating discussing the situation with the patient as he does not listen to what is being explained.  I told him that I have him on the schedule for the EUS FNA of the mass tomorrow.  The very next second he asks in a frustrated tone if I am going to perform the biopsy.  I am not certain how much simpler and in plan laymans terms I can explain the situation to him.  Plan: 1) EUS with FNA tomorrow.  LOS: 3 days   Undrea Shipes D 07/18/2016, 10:09 AM

## 2016-07-18 NOTE — Progress Notes (Signed)
PROGRESS NOTE    JERAL BYINGTON  [id number] DOB: [birth year] DOA: 07/15/2016 PCP: Lelon Huh, MD    Brief Narrative:  65 year old with past medical history of essential hypertension and arthritis that came to [facility] for worsening abdominal pain, unintentional weight loss and ability to eat the last 2 days, imaging was done which showed a pancreatic mass and obstructive jaundice. Once transferred to [facility] as ERCP services could not be provided at [facility], he is been placed nothing by mouth GI has been consulted for possible ERCP with stenting and biopsy. GI was consulted who performed an ERCP on 07/16/2016 no stones were found sphincterotomy was performed but CBD was hard to cannulate, I was consulted who performed a percutaneous biliary drain, patient is scheduled for an EUS with fine-needle aspiration on 07/19/2016.  Assessment & Plan: Pancreatic mass/Obstructive jaundice: - Advanced diet full liquid, place nothing by mouth after midnight. - ERCP attempted on 07/16/2016, no stones were found, sphincterotomy was done but hard to cannulate - CBD,  - IR was consulted and percutaneous biliary drain was placed. Patient is scheduled for EUS with Fina on 07/19/2016. CA-19-9 was greater than 300, alpha-fetoprotein was 7.5. Appreciate GI and interventional assistance.  Essential hypertension: - Blood pressure seems to be high add hydralazine when necessary as needed.  - For systolic blood pressure greater than 180.  History of tobacco use: Counseling.    Protein-calorie malnutrition, severe Ensure 3 times a day.     DVT prophylaxis: (lovenox Code Status: (Full Family Communication: Wife Disposition Plan: home hopefully in the afternoon   Consultants:   GI  Procedures:   ERCP on 8.18.2017  Antimicrobials:none    Subjective: No complains ready to get it over with.  Objective: Vitals:   07/17/16 2007 07/17/16 2043 07/17/16 2114 07/18/16 0503  BP: (!) 154/86 (!)  149/80 (!) 156/87 (!) 146/89  Pulse: 73 67 67 69  Resp: 16 16 18 16   Temp: 98.2 F (36.8 C) 97.8 F (36.6 C) 98.1 F (36.7 C) 98.4 F (36.9 C)  TempSrc: Oral Oral Oral Oral  SpO2: 96% 95% 96% 96%  Weight:      Height:        Intake/Output Summary (Last 24 hours) at 07/18/16 0843 Last data filed at 07/18/16 0804  Gross per 24 hour  Intake             2000 ml  Output             2875 ml  Net             -875 ml   Filed Weights   07/16/16 2000  Weight: 72.1 kg (158 lb 14.4 oz)    Examination:  General exam: Appears calm and comfortable  Respiratory system: Clear to auscultation. Respiratory effort normal. Cardiovascular system: S1 & S2 heard, RRR.  Gastrointestinal system: Abdomen is nondistended, soft and nontender. No organomegaly or masses felt. Normal bowel sounds heard. Central nervous system: Alert and oriented. No focal neurological deficits. Psychiatry: Judgement and insight appear normal. Mood & affect appropriate.     Data Reviewed: I have personally reviewed following labs and imaging studies  CBC:  Recent Labs Lab 07/12/16 1510 07/13/16 1335 07/14/16 0405 07/15/16 1623 07/16/16 0519  WBC 9.7 10.2 10.0 10.3 9.8  HGB  --  17.9 16.1 15.0 15.5  HCT 51.8* 52.2* 48.2 43.0 44.5  MCV 93 89.7 90.7 87.2 86.7  PLT 260 254 209 235 [id number]   Basic Metabolic Panel:  Recent Labs  Lab 07/13/16 1335 07/14/16 0405 07/15/16 1623 07/16/16 0519 07/17/16 1217  NA 133* 135 136 133* 134*  K 3.5 3.4* 3.1* 3.6 3.7  CL 93* 98* 101 98* 99*  CO2 31 27 26 27 28   GLUCOSE 115* 99 133* 123* 99  BUN 8 9 10 6 9   CREATININE 0.90 0.76 0.46* 0.60* 0.53*  CALCIUM 10.4* 9.5 9.3 9.4 9.5   GFR: Estimated Creatinine Clearance: 88.4 mL/min (by C-G formula based on SCr of 0.8 mg/dL). Liver Function Tests:  Recent Labs Lab 07/12/16 1510 07/13/16 1335 07/17/16 1217  AST 246* 203* 115*  ALT 351* 323* 204*  ALKPHOS 921* 896* 749*  BILITOT 5.7* 6.7* 6.7*  PROT 7.7 8.2* 6.2*    ALBUMIN 4.4 4.0 2.9*    Recent Labs Lab 07/13/16 1335  LIPASE 26   No results for input(s): AMMONIA in the last 168 hours. Coagulation Profile:  Recent Labs Lab 07/17/16 1217  INR 1.02   Cardiac Enzymes: No results for input(s): CKTOTAL, CKMB, CKMBINDEX, TROPONINI in the last 168 hours. BNP (last 3 results) No results for input(s): PROBNP in the last 8760 hours. HbA1C: No results for input(s): HGBA1C in the last 72 hours. CBG: No results for input(s): GLUCAP in the last 168 hours. Lipid Profile: No results for input(s): CHOL, HDL, LDLCALC, TRIG, CHOLHDL, LDLDIRECT in the last 72 hours. Thyroid Function Tests: No results for input(s): TSH, T4TOTAL, FREET4, T3FREE, THYROIDAB in the last 72 hours. Anemia Panel: No results for input(s): VITAMINB12, FOLATE, FERRITIN, TIBC, IRON, RETICCTPCT in the last 72 hours. Sepsis Labs: No results for input(s): PROCALCITON, LATICACIDVEN in the last 168 hours.  No results found for this or any previous visit (from the past 240 hour(s)).       Radiology Studies: Dg C-arm 61-120 Min-no Report  Result Date: 07/16/2016 CLINICAL DATA: stricture  C-ARM 61-120 MINUTES Fluoroscopy was utilized by the requesting physician.  No radiographic interpretation.   Ir Int Lianne Cure Biliary Drain With Cholangiogram  Result Date: 07/17/2016 INDICATION: BILIARY OBSTRUCTION, OBSTRUCTIVE JAUNDICE, UNSUCCESSFUL ERCP STENT INSERTION EXAM: IR INT-EXT BILIARY DRAIN W/ CHOLANGIOGRAM MEDICATIONS: 3.375 g Zosyn; The antibiotic was administered within an appropriate time frame prior to the initiation of the procedure. ANESTHESIA/SEDATION: Moderate (conscious) sedation was employed during this procedure. A total of Versed 2.0 mg and Fentanyl 200 mcg was administered intravenously. Moderate Sedation Time: 30 minutes. The patient's level of consciousness and vital signs were monitored continuously by radiology nursing throughout the procedure under my direct supervision.  FLUOROSCOPY TIME:  Fluoroscopy Time: 3 minutes 24 seconds (73 mGy). COMPLICATIONS: None immediate. PROCEDURE: Informed written consent was obtained from the patient after a thorough discussion of the procedural risks, benefits and alternatives. All questions were addressed. Maximal Sterile Barrier Technique was utilized including caps, mask, sterile gowns, sterile gloves, sterile drape, hand hygiene and skin antiseptic. A timeout was performed prior to the initiation of the procedure. Previous imaging reviewed. Under sterile conditions and local anesthesia, ultrasound percutaneous needle access performed of a peripheral right hepatic duct in the mid axillary line through a lower intercostal space. There was return of bile. Contrast injection performed for a cholangiogram. Cholangiogram confirms diffuse biliary obstruction and marked dilatation. 018 guidewire advanced followed by the Accustick dilator set. Outer dilator advanced into the distal CBD. Contrast injection confirms position. Amplatz guidewire advanced into the duodenum. Tract dilatation performed to advance a 10 [place] internal external biliary drain. Retention loop formed in the duodenum. Peripheral marker within the right hepatic duct. Contrast injection confirms  drainage of the right and left ducts adequately. Gravity drainage bag connected. Catheter secured with a Prolene suture and a sterile dressing. No immediate complication. Patient tolerated the procedure well. IMPRESSION: Successful ultrasound and fluoroscopic 10 French right internal external biliary drain. Electronically Signed   By: Jerilynn Mages.  Shick M.D.   On: 07/17/2016 18:27        Scheduled Meds: . [START ON 07/19/2016] enoxaparin (LOVENOX) injection  40 mg Subcutaneous Daily  . feeding supplement  1 Container Oral TID BM   Continuous Infusions: . sodium chloride    . dextrose 5 % and 0.45 % NaCl with KCl 40 mEq/L 75 mL/hr at 07/18/16 0522     LOS: 3 days    Time spent: 15  min    Charlynne Cousins, MD [facility] Pager [phone] If 7PM-7AM, please contact night-coverage [email or web address] Password [facility] 07/18/2016, 8:43 AM

## 2016-07-19 ENCOUNTER — Inpatient Hospital Stay (HOSPITAL_COMMUNITY): Payer: No Typology Code available for payment source | Admitting: Anesthesiology

## 2016-07-19 ENCOUNTER — Encounter (HOSPITAL_COMMUNITY)
Admission: AD | Disposition: A | Discharge status: Home / Self Care | Payer: Self-pay | Source: Other Acute Inpatient Hospital | Attending: Internal Medicine

## 2016-07-19 ENCOUNTER — Encounter (HOSPITAL_COMMUNITY): Payer: Self-pay | Admitting: Anesthesiology

## 2016-07-19 HISTORY — PX: EUS: SHX5427

## 2016-07-19 SURGERY — UPPER ENDOSCOPIC ULTRASOUND (EUS) LINEAR
Anesthesia: Monitor Anesthesia Care | Laterality: Left

## 2016-07-19 MED ORDER — PROPOFOL 500 MG/50ML IV EMUL
INTRAVENOUS | Status: DC | PRN
  Administered 2016-07-19: 125 ug/kg/min via INTRAVENOUS

## 2016-07-19 MED ORDER — SODIUM CHLORIDE 0.9 % IV SOLN: INTRAVENOUS | Status: DC

## 2016-07-19 MED ORDER — PROPOFOL 500 MG/50ML IV EMUL
INTRAVENOUS | Status: DC | PRN
  Administered 2016-07-19: 40 mg via INTRAVENOUS

## 2016-07-19 MED ORDER — PROPOFOL 10 MG/ML IV BOLUS
INTRAVENOUS | Status: AC
  Filled 2016-07-19: qty 20

## 2016-07-19 MED ORDER — ONDANSETRON HCL 4 MG/2ML IJ SOLN
INTRAMUSCULAR | Status: AC
  Filled 2016-07-19: qty 2

## 2016-07-19 MED ORDER — LACTATED RINGERS IV SOLN
INTRAVENOUS | Status: DC | PRN
  Administered 2016-07-19: 12:00:00 via INTRAVENOUS

## 2016-07-19 MED ORDER — MIDAZOLAM HCL 5 MG/5ML IJ SOLN
INTRAMUSCULAR | Status: DC | PRN
  Administered 2016-07-19: 2 mg via INTRAVENOUS

## 2016-07-19 MED ORDER — PROPOFOL 10 MG/ML IV BOLUS
INTRAVENOUS | Status: AC
  Filled 2016-07-19: qty 40

## 2016-07-19 MED ORDER — MIDAZOLAM HCL 2 MG/2ML IJ SOLN
INTRAMUSCULAR | Status: AC
  Filled 2016-07-19: qty 2

## 2016-07-19 MED ORDER — SODIUM CHLORIDE 0.9 % IJ SOLN
INTRAMUSCULAR | Status: AC
  Filled 2016-07-19: qty 10

## 2016-07-19 MED ORDER — ONDANSETRON HCL 4 MG/2ML IJ SOLN
INTRAMUSCULAR | Status: DC | PRN
  Administered 2016-07-19: 4 mg via INTRAVENOUS

## 2016-07-19 MED ORDER — EPHEDRINE SULFATE 50 MG/ML IJ SOLN
INTRAMUSCULAR | Status: AC
  Filled 2016-07-19: qty 1

## 2016-07-19 NOTE — Transfer of Care (Signed)
Immediate Anesthesia Transfer of Care Note  Patient: Francisco Charles  Procedure(s) Performed: Procedure(s): UPPER ENDOSCOPIC ULTRASOUND (EUS) LINEAR (Left)  Patient Location: PACU  Anesthesia Type:MAC  Level of Consciousness:  sedated, patient cooperative and responds to stimulation  Airway & Oxygen Therapy:Patient Spontanous Breathing and Patient connected to face mask oxgen  Post-op Assessment:  Report given to PACU RN and Post -op Vital signs reviewed and stable  Post vital signs:  Reviewed and stable  Last Vitals:  Vitals:   07/19/16 0553 07/19/16 1133  BP: (!) 167/90 (!) 163/90  Pulse: 69 77  Resp: 16 16  Temp: 36.8 C [id number]    Complications: No apparent anesthesia complications

## 2016-07-19 NOTE — Progress Notes (Signed)
PROGRESS NOTE    Francisco Charles  [id number] DOB: 20-Jan-1951 DOA: 07/15/2016 PCP: Lelon Huh, MD    Brief Narrative:  65 year old with past medical history of essential hypertension and arthritis that came to [facility] for worsening abdominal pain, unintentional weight loss and ability to eat the last 2 days, imaging was done which showed a pancreatic mass and obstructive jaundice. Once transferred to [facility] as ERCP services could not be provided at [facility], he is been placed nothing by mouth GI has been consulted for possible ERCP with stenting and biopsy. GI was consulted who performed an ERCP on 07/16/2016 no stones were found sphincterotomy was performed but CBD was hard to cannulate, I was consulted who performed a percutaneous biliary drain, patient is scheduled for an EUS with fine-needle aspiration on 07/19/2016.  Assessment & Plan: Pancreatic mass/Obstructive jaundice: - Nothing by mouth. - Patient is scheduled for EUS with Fina on 07/19/2016.   Essential hypertension: - Blood pressure seems to be high cont. hydralazine when necessary as needed.  - For systolic blood pressure greater than 180.  History of tobacco use: Counseling.    Protein-calorie malnutrition, severe Ensure 3 times a day.     DVT prophylaxis: (lovenox Code Status: (Full Family Communication: Wife Disposition Plan: home hopefully in the afternoon   Consultants:   GI  Procedures:   ERCP on 8.18.2017  Antimicrobials:none    Subjective: Want to go home after procedure.  Objective: Vitals:   07/18/16 0503 07/18/16 1357 07/18/16 2221 07/19/16 0553  BP: (!) 146/89 (!) 152/91 (!) 149/89 (!) 167/90  Pulse: 69 86 78 69  Resp: 16 16 16 16   Temp: 98.4 F (36.9 C) 97.7 F (36.5 C) 97.7 F (36.5 C) 98.3 F (36.8 C)  TempSrc: Oral Axillary Axillary Oral  SpO2: 96% 96% 94% 96%  Weight:      Height:        Intake/Output Summary (Last 24 hours) at 07/19/16 0708 Last data filed at  07/19/16 0600  Gross per 24 hour  Intake          1161.75 ml  Output             4100 ml  Net         -2938.25 ml   Filed Weights   07/16/16 2000  Weight: 72.1 kg (158 lb 14.4 oz)    Examination:  General exam: Appears calm and comfortable  Respiratory system: Clear to auscultation. Respiratory effort normal. Cardiovascular system: S1 & S2 heard, RRR.  Gastrointestinal system: Abdomen is nondistended, soft and nontender. No organomegaly or masses felt. Normal bowel sounds heard. Central nervous system: Alert and oriented. No focal neurological deficits. Psychiatry: Judgement and insight appear normal. Mood & affect appropriate.     Data Reviewed: I have personally reviewed following labs and imaging studies  CBC:  Recent Labs Lab 07/12/16 1510 07/13/16 1335 07/14/16 0405 07/15/16 1623 07/16/16 0519  WBC 9.7 10.2 10.0 10.3 9.8  HGB  --  17.9 16.1 15.0 15.5  HCT 51.8* 52.2* 48.2 43.0 44.5  MCV 93 89.7 90.7 87.2 86.7  PLT 260 254 209 235 [id number]   Basic Metabolic Panel:  Recent Labs Lab 07/13/16 1335 07/14/16 0405 07/15/16 1623 07/16/16 0519 07/17/16 1217  NA 133* 135 136 133* 134*  K 3.5 3.4* 3.1* 3.6 3.7  CL 93* 98* 101 98* 99*  CO2 31 27 26 27 28   GLUCOSE 115* 99 133* 123* 99  BUN 8 9 10 6 9   CREATININE 0.90  0.76 0.46* 0.60* 0.53*  CALCIUM 10.4* 9.5 9.3 9.4 9.5   GFR: Estimated Creatinine Clearance: 88.4 mL/min (by C-G formula based on SCr of 0.8 mg/dL). Liver Function Tests:  Recent Labs Lab 07/12/16 1510 07/13/16 1335 07/17/16 1217  AST 246* 203* 115*  ALT 351* 323* 204*  ALKPHOS 921* 896* 749*  BILITOT 5.7* 6.7* 6.7*  PROT 7.7 8.2* 6.2*  ALBUMIN 4.4 4.0 2.9*    Recent Labs Lab 07/13/16 1335  LIPASE 26   No results for input(s): AMMONIA in the last 168 hours. Coagulation Profile:  Recent Labs Lab 07/17/16 1217  INR 1.02   Cardiac Enzymes: No results for input(s): CKTOTAL, CKMB, CKMBINDEX, TROPONINI in the last 168 hours. BNP (last  3 results) No results for input(s): PROBNP in the last 8760 hours. HbA1C: No results for input(s): HGBA1C in the last 72 hours. CBG: No results for input(s): GLUCAP in the last 168 hours. Lipid Profile: No results for input(s): CHOL, HDL, LDLCALC, TRIG, CHOLHDL, LDLDIRECT in the last 72 hours. Thyroid Function Tests: No results for input(s): TSH, T4TOTAL, FREET4, T3FREE, THYROIDAB in the last 72 hours. Anemia Panel: No results for input(s): VITAMINB12, FOLATE, FERRITIN, TIBC, IRON, RETICCTPCT in the last 72 hours. Sepsis Labs: No results for input(s): PROCALCITON, LATICACIDVEN in the last 168 hours.  Recent Results (from the past 240 hour(s))  Aerobic/Anaerobic Culture (surgical/deep wound)     Status: None (Preliminary result)   Collection Time: 07/17/16  6:19 PM  Result Value Ref Range Status   Specimen Description BILE  Final   Special Requests Normal  Final   Gram Stain   Final    NO WBC SEEN NO ORGANISMS SEEN Performed at [facility]    Culture PENDING  Incomplete   Report Status PENDING  Incomplete         Radiology Studies: Ir Int Lianne Cure Biliary Drain With Cholangiogram  Result Date: 07/17/2016 INDICATION: BILIARY OBSTRUCTION, OBSTRUCTIVE JAUNDICE, UNSUCCESSFUL ERCP STENT INSERTION EXAM: IR INT-EXT BILIARY DRAIN W/ CHOLANGIOGRAM MEDICATIONS: 3.375 g Zosyn; The antibiotic was administered within an appropriate time frame prior to the initiation of the procedure. ANESTHESIA/SEDATION: Moderate (conscious) sedation was employed during this procedure. A total of Versed 2.0 mg and Fentanyl 200 mcg was administered intravenously. Moderate Sedation Time: 30 minutes. The patient's level of consciousness and vital signs were monitored continuously by radiology nursing throughout the procedure under my direct supervision. FLUOROSCOPY TIME:  Fluoroscopy Time: 3 minutes 24 seconds (73 mGy). COMPLICATIONS: None immediate. PROCEDURE: Informed written consent was obtained from the  patient after a thorough discussion of the procedural risks, benefits and alternatives. All questions were addressed. Maximal Sterile Barrier Technique was utilized including caps, mask, sterile gowns, sterile gloves, sterile drape, hand hygiene and skin antiseptic. A timeout was performed prior to the initiation of the procedure. Previous imaging reviewed. Under sterile conditions and local anesthesia, ultrasound percutaneous needle access performed of a peripheral right hepatic duct in the mid axillary line through a lower intercostal space. There was return of bile. Contrast injection performed for a cholangiogram. Cholangiogram confirms diffuse biliary obstruction and marked dilatation. 018 guidewire advanced followed by the Accustick dilator set. Outer dilator advanced into the distal CBD. Contrast injection confirms position. Amplatz guidewire advanced into the duodenum. Tract dilatation performed to advance a 10 [place] internal external biliary drain. Retention loop formed in the duodenum. Peripheral marker within the right hepatic duct. Contrast injection confirms drainage of the right and left ducts adequately. Gravity drainage bag connected. Catheter secured with  a Prolene suture and a sterile dressing. No immediate complication. Patient tolerated the procedure well. IMPRESSION: Successful ultrasound and fluoroscopic 10 French right internal external biliary drain. Electronically Signed   By: Jerilynn Mages.  Shick M.D.   On: 07/17/2016 18:27        Scheduled Meds: . enoxaparin (LOVENOX) injection  40 mg Subcutaneous Daily  . feeding supplement  1 Container Oral TID BM   Continuous Infusions: . dextrose 5 % and 0.45 % NaCl with KCl 40 mEq/L 10 mL/hr at 07/18/16 1057     LOS: 4 days    Time spent: 15 min    Charlynne Cousins, MD [facility] Pager [phone] If 7PM-7AM, please contact night-coverage [email or web address] Password [email or web address] 07/19/2016, 7:08 AM

## 2016-07-19 NOTE — Anesthesia Preprocedure Evaluation (Addendum)
Anesthesia Evaluation  Patient identified by MRN, date of birth, ID band Patient awake    Reviewed: Allergy & Precautions, NPO status , Patient's Chart, lab work & pertinent test results  Airway Mallampati: I  TM Distance: >3 FB Neck ROM: Full    Dental  (+) Missing, Dental Advisory Given   Pulmonary Current Smoker,    breath sounds clear to auscultation       Cardiovascular hypertension, Pt. on medications  Rhythm:Regular Rate:Normal     Neuro/Psych  Neuromuscular disease negative psych ROS   GI/Hepatic Neg liver ROS, GERD  Medicated,  Endo/Other  negative endocrine ROS  Renal/GU negative Renal ROS  negative genitourinary   Musculoskeletal  (+) Arthritis , Osteoarthritis,    Abdominal Normal abdominal exam  (+)   Peds negative pediatric ROS (+)  Hematology negative hematology ROS (+)   Anesthesia Other Findings   Reproductive/Obstetrics negative OB ROS                           Anesthesia Physical Anesthesia Plan  ASA: II  Anesthesia Plan: MAC   Post-op Pain Management:    Induction: Intravenous  Airway Management Planned: Natural Airway  Additional Equipment:   Intra-op Plan:   Post-operative Plan:   Informed Consent: I have reviewed the patients History and Physical, chart, labs and discussed the procedure including the risks, benefits and alternatives for the proposed anesthesia with the patient or authorized representative who has indicated his/her understanding and acceptance.     Plan Discussed with: CRNA  Anesthesia Plan Comments:         Anesthesia Quick Evaluation

## 2016-07-19 NOTE — H&P (View-Only) (Signed)
Subjective: Some abdominal pain from the PTC.  Objective: Vital signs in last 24 hours: Temp:  [97.7 F (36.5 C)-98.4 F (36.9 C)] 98.4 F (36.9 C) (08/20 0503) Pulse Rate:  [59-83] 69 (08/20 0503) Resp:  [13-18] 16 (08/20 0503) BP: (146-180)/(80-113) 146/89 (08/20 0503) SpO2:  [94 %-99 %] 96 % (08/20 0503) Last BM Date: 07/09/16  Intake/Output from previous day: 08/19 0701 - 08/20 0700 In: 2000 [P.O.:200; I.V.:1800] Out: 2675 [Urine:1250; Drains:1425] Intake/Output this shift: Total I/O In: -  Out: 200 [Drains:200]  General appearance: alert and no distress GI: some tenderness at the PTC site.  Lab Results:  Recent Labs  07/15/16 1623 07/16/16 0519  WBC 10.3 9.8  HGB 15.0 15.5  HCT 43.0 44.5  PLT 235 244   BMET  Recent Labs  07/15/16 1623 07/16/16 0519 07/17/16 1217  NA 136 133* 134*  K 3.1* 3.6 3.7  CL 101 98* 99*  CO2 26 27 28  GLUCOSE 133* 123* 99  BUN 10 6 9  CREATININE 0.46* 0.60* 0.53*  CALCIUM 9.3 9.4 9.5   LFT  Recent Labs  07/17/16 1217  PROT 6.2*  ALBUMIN 2.9*  AST 115*  ALT 204*  ALKPHOS 749*  BILITOT 6.7*   PT/INR  Recent Labs  07/17/16 1217  LABPROT 13.4  INR 1.02   Hepatitis Panel No results for input(s): HEPBSAG, HCVAB, HEPAIGM, HEPBIGM in the last 72 hours. C-Diff No results for input(s): CDIFFTOX in the last 72 hours. Fecal Lactopherrin No results for input(s): FECLLACTOFRN in the last 72 hours.  Studies/Results: Dg C-arm 61-120 Min-no Report  Result Date: 07/16/2016 CLINICAL DATA: stricture  C-ARM 61-120 MINUTES Fluoroscopy was utilized by the requesting physician.  No radiographic interpretation.   Ir Int Ext Biliary Drain With Cholangiogram  Result Date: 07/17/2016 INDICATION: BILIARY OBSTRUCTION, OBSTRUCTIVE JAUNDICE, UNSUCCESSFUL ERCP STENT INSERTION EXAM: IR INT-EXT BILIARY DRAIN W/ CHOLANGIOGRAM MEDICATIONS: 3.375 g Zosyn; The antibiotic was administered within an appropriate time frame prior to the  initiation of the procedure. ANESTHESIA/SEDATION: Moderate (conscious) sedation was employed during this procedure. A total of Versed 2.0 mg and Fentanyl 200 mcg was administered intravenously. Moderate Sedation Time: 30 minutes. The patient's level of consciousness and vital signs were monitored continuously by radiology nursing throughout the procedure under my direct supervision. FLUOROSCOPY TIME:  Fluoroscopy Time: 3 minutes 24 seconds (73 mGy). COMPLICATIONS: None immediate. PROCEDURE: Informed written consent was obtained from the patient after a thorough discussion of the procedural risks, benefits and alternatives. All questions were addressed. Maximal Sterile Barrier Technique was utilized including caps, mask, sterile gowns, sterile gloves, sterile drape, hand hygiene and skin antiseptic. A timeout was performed prior to the initiation of the procedure. Previous imaging reviewed. Under sterile conditions and local anesthesia, ultrasound percutaneous needle access performed of a peripheral right hepatic duct in the mid axillary line through a lower intercostal space. There was return of bile. Contrast injection performed for a cholangiogram. Cholangiogram confirms diffuse biliary obstruction and marked dilatation. 018 guidewire advanced followed by the Accustick dilator set. Outer dilator advanced into the distal CBD. Contrast injection confirms position. Amplatz guidewire advanced into the duodenum. Tract dilatation performed to advance a 10 French internal external biliary drain. Retention loop formed in the duodenum. Peripheral marker within the right hepatic duct. Contrast injection confirms drainage of the right and left ducts adequately. Gravity drainage bag connected. Catheter secured with a Prolene suture and a sterile dressing. No immediate complication. Patient tolerated the procedure well. IMPRESSION: Successful ultrasound and fluoroscopic   10 French right internal external biliary drain.  Electronically Signed   By: M.  Shick M.D.   On: 07/17/2016 18:27    Medications:  Scheduled: . [START ON 07/19/2016] enoxaparin (LOVENOX) injection  40 mg Subcutaneous Daily  . feeding supplement  1 Container Oral TID BM   Continuous: . 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   The PTC was successful.  I am appreciative for IR intervention.  I will plan on an EUS with FNA tomorrow.  It is very frustrating discussing the situation with the patient as he does not listen to what is being explained.  I told him that I have him on the schedule for the EUS FNA of the mass tomorrow.  The very next second he asks in a frustrated tone if I am going to perform the biopsy.  I am not certain how much simpler and in plan laymans terms I can explain the situation to him.  Plan: 1) EUS with FNA tomorrow.  LOS: 3 days   Cythia Bachtel D 07/18/2016, 10:09 AM 

## 2016-07-19 NOTE — Interval H&P Note (Signed)
History and Physical Interval Note:  07/19/2016 12:05 PM  Francisco Charles  has presented today for surgery, with the diagnosis of Pancreatic mass  The various methods of treatment have been discussed with the patient and family. After consideration of risks, benefits and other options for treatment, the patient has consented to  Procedure(s): UPPER ENDOSCOPIC ULTRASOUND (EUS) LINEAR (Left) as a surgical intervention .  The patient's history has been reviewed, patient examined, no change in status, stable for surgery.  I have reviewed the patient's chart and labs.  Questions were answered to the patient's satisfaction.     Suhas Estis D

## 2016-07-19 NOTE — Op Note (Signed)
[facility] Patient Name: Francisco Charles Procedure Date: 07/19/2016 MRN: [id number] Attending MD: Carol Ada , MD Date of Birth: 1951-03-31 CSN: [id number] Age: 65 Admit Type: Inpatient Procedure:                Upper EUS Indications:              Suspected mass in pancreas on CT scan, Obstruction                            of bile duct on CT Providers:                Carol Ada, MD, Dustin Flock RN, RN, William Dalton, Technician Referring MD:              Medicines:                Propofol per Anesthesia Complications:            No immediate complications. Estimated Blood Loss:     Estimated blood loss was minimal. Procedure:                Pre-Anesthesia Assessment:                           - Prior to the procedure, a History and Physical                            was performed, and patient medications and                            allergies were reviewed. The patient's tolerance of                            previous anesthesia was also reviewed. The risks                            and benefits of the procedure and the sedation                            options and risks were discussed with the patient.                            All questions were answered, and informed consent                            was obtained. Prior Anticoagulants: The patient has                            taken no previous anticoagulant or antiplatelet                            agents. ASA Grade Assessment: III - A patient with  severe systemic disease. After reviewing the risks                            and benefits, the patient was deemed in                            satisfactory condition to undergo the procedure.                           - Sedation was administered by an anesthesia                            professional. Deep sedation was attained.                           After obtaining informed consent, the  endoscope was                            passed under direct vision. Throughout the                            procedure, the patient's blood pressure, pulse, and                            oxygen saturations were monitored continuously. The                            [id number]) scope was introduced through                            the mouth, and advanced to the duodenal bulb. The                            upper EUS was accomplished without difficulty. The                            patient tolerated the procedure well. Findings:      Endosonographic Finding :      There was no sign of significant endosonographic abnormality in the       pancreatic body, in the pancreatic tail and in the uncinate process of       the pancreas. The pancreatic duct measured up to 3 mm in diameter. Fine       needle aspiration for cytology was performed. Color Doppler imaging was       utilized prior to needle puncture to confirm a lack of significant       vascular structures within the needle path. Three passes were made with       the 25 gauge needle using a transduodenal approach. A stylet was used. A       cytologist was present and performed a preliminary cytologic       examination. Final cytology results are pending.      The EUS findings did not correlate with the CT scan findings. In the       superior posterior portion of the pancreas at the neck of the pancreas  no mass, lymph node, or inflammation was identified. The biliary stent       was noted in the CBD and in the head of the pancreas there was an       amorphus irregularly bordered mixed echogenecity lesion. The echo       characteristics were not normal and it was not the ventral portion of       the pancreas. Three passes with the FNA needle were performed and the       lesion was firm and possibly fibrous. Excellent samples were obtained. Impression:               - There was no sign of significant pathology in the                             pancreatic body, in the pancreatic tail and in the                            uncinate process of the pancreas. Fine needle                            aspiration performed. Moderate Sedation:      N/A- Per Anesthesia Care Recommendation:           - Return patient to hospital ward for ongoing care.                           - Resume regular diet.                           - Await cytology results. ? Chronic pancreatitis. Procedure Code(s):        --- Professional ---                           [phone], Esophagogastroduodenoscopy, flexible,                            transoral; with transendoscopic ultrasound-guided                            intramural or transmural fine needle                            aspiration/biopsy(s), (includes endoscopic                            ultrasound examination limited to the esophagus,                            stomach or duodenum, and adjacent structures) Diagnosis Code(s):        --- Professional ---                           K83.1, Obstruction of bile duct                           R93.3, Abnormal findings on diagnostic imaging of  other parts of digestive tract CPT copyright 2016 [employer]. All rights reserved. The codes documented in this report are preliminary and upon coder review may  be revised to meet current compliance requirements. Carol Ada, MD Carol Ada, MD 07/19/2016 1:18:09 PM This report has been signed electronically. Number of Addenda: 0

## 2016-07-19 NOTE — Anesthesia Postprocedure Evaluation (Signed)
Anesthesia Post Note  Patient: Francisco Charles  Procedure(s) Performed: Procedure(s) (LRB): UPPER ENDOSCOPIC ULTRASOUND (EUS) LINEAR (Left)  Patient location during evaluation: PACU Anesthesia Type: MAC Level of consciousness: awake and alert Pain management: pain level controlled Vital Signs Assessment: post-procedure vital signs reviewed and stable Respiratory status: spontaneous breathing, nonlabored ventilation, respiratory function stable and patient connected to nasal cannula oxygen Cardiovascular status: stable and blood pressure returned to baseline Anesthetic complications: no    Last Vitals:  Vitals:   07/19/16 1330 07/19/16 1335  BP: (!) 172/98   Pulse:    Resp: (!) 24 (!) 26  Temp:      Last Pain:  Vitals:   07/19/16 1133  TempSrc:   PainSc: 10-Worst pain ever                 Effie Berkshire

## 2016-07-20 ENCOUNTER — Encounter: Payer: Self-pay | Admitting: Oncology

## 2016-07-20 ENCOUNTER — Inpatient Hospital Stay: Payer: No Typology Code available for payment source | Attending: Oncology

## 2016-07-20 ENCOUNTER — Inpatient Hospital Stay (HOSPITAL_BASED_OUTPATIENT_CLINIC_OR_DEPARTMENT_OTHER): Payer: No Typology Code available for payment source | Admitting: Oncology

## 2016-07-20 VITALS — BP 130/87 | HR 99 | Temp 97.9°F | Resp 18 | Ht 67.0 in | Wt 152.0 lb

## 2016-07-20 DIAGNOSIS — Z79899 Other long term (current) drug therapy: Secondary | ICD-10-CM

## 2016-07-20 DIAGNOSIS — D831 Common variable immunodeficiency with predominant immunoregulatory T-cell disorders: Secondary | ICD-10-CM

## 2016-07-20 DIAGNOSIS — R748 Abnormal levels of other serum enzymes: Secondary | ICD-10-CM | POA: Diagnosis not present

## 2016-07-20 DIAGNOSIS — K869 Disease of pancreas, unspecified: Secondary | ICD-10-CM | POA: Diagnosis not present

## 2016-07-20 DIAGNOSIS — R5383 Other fatigue: Secondary | ICD-10-CM

## 2016-07-20 DIAGNOSIS — R5381 Other malaise: Secondary | ICD-10-CM

## 2016-07-20 DIAGNOSIS — R109 Unspecified abdominal pain: Secondary | ICD-10-CM | POA: Insufficient documentation

## 2016-07-20 DIAGNOSIS — K831 Obstruction of bile duct: Secondary | ICD-10-CM | POA: Insufficient documentation

## 2016-07-20 DIAGNOSIS — K8689 Other specified diseases of pancreas: Secondary | ICD-10-CM

## 2016-07-20 DIAGNOSIS — R531 Weakness: Secondary | ICD-10-CM | POA: Insufficient documentation

## 2016-07-20 DIAGNOSIS — R831 Abnormal level of hormones in cerebrospinal fluid: Secondary | ICD-10-CM

## 2016-07-20 DIAGNOSIS — M1990 Unspecified osteoarthritis, unspecified site: Secondary | ICD-10-CM

## 2016-07-20 DIAGNOSIS — I10 Essential (primary) hypertension: Secondary | ICD-10-CM | POA: Insufficient documentation

## 2016-07-20 DIAGNOSIS — F17210 Nicotine dependence, cigarettes, uncomplicated: Secondary | ICD-10-CM

## 2016-07-20 DIAGNOSIS — Z7982 Long term (current) use of aspirin: Secondary | ICD-10-CM

## 2016-07-20 LAB — COMPREHENSIVE METABOLIC PANEL
ALBUMIN: 3.5 g/dL (ref 3.5–5.0)
ALK PHOS: 752 U/L — AB (ref 38–126)
ALT: 150 U/L — AB (ref 17–63)
AST: 75 U/L — AB (ref 15–41)
Anion gap: 15 (ref 5–15)
BILIRUBIN TOTAL: 4.3 mg/dL — AB (ref 0.3–1.2)
BUN: 16 mg/dL (ref 6–20)
CALCIUM: 10.8 mg/dL — AB (ref 8.9–10.3)
CO2: 24 mmol/L (ref 22–32)
Chloride: 93 mmol/L — ABNORMAL LOW (ref 101–111)
Creatinine, Ser: 0.83 mg/dL (ref 0.61–1.24)
GFR calc Af Amer: >60 mL/min (ref >60)
GFR calc non Af Amer: >60 mL/min (ref >60)
GLUCOSE: 127 mg/dL — AB (ref 65–99)
Potassium: 3.8 mmol/L (ref 3.5–5.1)
SODIUM: 132 mmol/L — AB (ref 135–145)
Total Protein: 8.1 g/dL (ref 6.5–8.1)

## 2016-07-20 LAB — CBC WITH DIFFERENTIAL/PLATELET
BASOS ABS: 0 10*3/uL (ref 0–0.1)
BASOS PCT: 0 %
EOS ABS: 0.1 10*3/uL (ref 0–0.7)
Eosinophils Relative: 1 %
HEMATOCRIT: 51.2 % (ref 40.0–52.0)
HEMOGLOBIN: 17.6 g/dL (ref 13.0–18.0)
Lymphocytes Relative: 7 %
Lymphs Abs: 0.9 10*3/uL — ABNORMAL LOW (ref 1.0–3.6)
MCH: 30.3 pg (ref 26.0–34.0)
MCHC: 34.3 g/dL (ref 32.0–36.0)
MCV: 88.4 fL (ref 80.0–100.0)
MONOS PCT: 8 %
Monocytes Absolute: 1 10*3/uL (ref 0.2–1.0)
NEUTROS ABS: 11.5 10*3/uL — AB (ref 1.4–6.5)
NEUTROS PCT: 84 %
Platelets: 292 10*3/uL (ref 150–440)
RBC: 5.79 MIL/uL (ref 4.40–5.90)
RDW: 14.4 % (ref 11.5–14.5)
WBC: 13.6 10*3/uL — AB (ref 3.8–10.6)

## 2016-07-20 MED ORDER — TRAMADOL HCL 50 MG PO TABS: 100.0000 mg | ORAL_TABLET | Freq: Four times a day (QID) | ORAL | 0 refills | Status: AC | PRN

## 2016-07-20 NOTE — Progress Notes (Signed)
  Oncology Nurse Navigator Documentation  Navigator Location: [facility]-Med Onc (07/20/16 1600) Navigator Encounter Type: Other (hospital follow up) (07/20/16 1600)           Patient Visit Type: MedOnc (07/20/16 1600)                    Acuity: Level 2 (07/20/16 1600)   Acuity Level 2: Initial guidance, education and coordination as needed;Educational needs;Assistance expediting appointments;Ongoing guidance and education throughout treatment as needed (07/20/16 1600)     Time Spent with Patient: 30 (07/20/16 1600)   Introduced nurse navigator services during hospital follow up visit. Provided contact information for any future questions or needs. After receiving PET instructions, escorted to lab.

## 2016-07-20 NOTE — Progress Notes (Signed)
Discharge instructions discussed with patient and wife, verbalized agreement and understanding.  Prescription given to patient

## 2016-07-20 NOTE — Discharge Summary (Signed)
Physician Discharge Summary  Francisco Charles [id number] DOB: 06/21/1951 DOA: 07/15/2016  PCP: Lelon Huh, MD  Admit date: 07/15/2016 Discharge date: 07/20/2016  Admitted From: home Disposition:  Home  Recommendations for Outpatient Follow-up:  1. Follow up with Oncologyin 1 to follow-up on biopsy results. 2. 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 GI was consulted and ERCP was attempted on 07/16/2016 that showed no stone sphincterotomy versus sessile but the CBD could not be cannulated. IR was consulted who performed a percutaneous biliary drain placed on 07/18/2016. CAD 19-9 was greater than 300. GI reattempted ERCP with EUS and fine-needle aspiration 07/19/2016 which was successful. No masses or abnormalities were noted. Follow-up with oncology this week to over results of the biopsy.  Essential hypertension Continue current home regimen no changes made.  Protein-calorie malnutrition, severe    Discharge Instructions  Discharge Instructions    Diet - low sodium heart healthy    Complete by:  As directed   Increase activity slowly    Complete by:  As directed       Medication List    TAKE these medications   aspirin EC 81 MG tablet Take 81 mg by mouth daily.   feeding supplement Liqd Take 1 Container by mouth 3 (three) times daily between meals.   omeprazole 20 MG capsule Commonly known as:  PRILOSEC Take 20 mg by mouth  daily.   quinapril-hydrochlorothiazide 20-12.5 MG tablet Commonly known as:  ACCURETIC TAKE 1 TABLET BY MOUTH DAILY.   traMADol 50 MG tablet Commonly known as:  ULTRAM Take 2 tablets (100 mg total) by mouth every 6 (six) hours as needed for moderate pain.       No Known Allergies  Consultations:  IR  Gastroenterology Dr. Benson [place].   Procedures/Studies: Ct Abdomen Pelvis W Contrast  Result Date: 07/13/2016 CLINICAL DATA:  65 year old male with right abdominal pain, most prominent in the right lower quadrant. Elevated liver function tests. Postprandial nausea. History of prostate biopsy. EXAM: CT ABDOMEN AND PELVIS WITH CONTRAST TECHNIQUE: Multidetector CT imaging of the abdomen and pelvis was performed using the standard protocol following bolus administration of intravenous contrast. CONTRAST:  154mL ISOVUE-300 IOPAMIDOL (ISOVUE-300) INJECTION 61% COMPARISON:  09/29/2012 bone protocol CT pelvis. No prior CT abdomen/pelvis. FINDINGS: Lower chest: No significant pulmonary nodules or acute consolidative airspace disease. Hepatobiliary: Normal liver size. Simple 0.8 cm liver cyst in segment 8 of the right liver lobe. No additional discrete liver lesions. There is slightly irregular annular wall thickening and mucosal hyperenhancement in the fundal gallbladder wall, with loss of the normal fat plane between the fundal gallbladder wall and liver (series 2/ image 32). No radiopaque cholelithiasis. No significant gallbladder distention. No pericholecystic fluid. There is moderate diffuse intrahepatic biliary ductal dilatation. There is a focal stricture in the upper third of the common bile duct with associated common bile duct wall thickening and hyperenhancement (best seen on coronal series 5/ image 46). Pancreas: No pancreatic duct dilation. There is a heterogeneous hypodense 2.1 x 1.5 cm mass at the posterior superior  margin of the pancreatic neck (series 2/ image 29), which demonstrates ill-defined  margins with infiltrative soft tissue extending into the porta hepatis along the common bile duct. Spleen: Normal size. No mass. Adrenals/Urinary Tract: There is irregular thickening of the bilateral adrenal glands, with the suggestion of small adrenal nodules bilaterally measuring 1.0 cm on the right (series 2/ image 24) and 1.3 cm on the left (series 2/ image 25). No hydronephrosis. Simple 2.0 cm interpolar left renal cyst. Simple 1.0 cm lower right renal cyst. No additional renal lesions. Limited visualization of the collapsed bladder due to streak artifact from left hip hardware with no gross bladder abnormality . Stomach/Bowel: Grossly normal stomach. Normal caliber small bowel with no small bowel wall thickening. Normal appendix . Mild sigmoid diverticulosis, with no large bowel wall thickening or pericolonic fat stranding. Vascular/Lymphatic: Atherosclerotic nonaneurysmal abdominal aorta. Patent portal, splenic, hepatic and renal veins. No pathologically enlarged lymph nodes in the abdomen or pelvis. Reproductive: Poorly visualized prostate due to streak artifact from the left hip hardware. Prostate appears mildly enlarged. Other: No pneumoperitoneum. No ascites. No focal fluid collection. There is a heterogeneously enhancing 1.5 x 1.4 cm left paracolic gutter peritoneal nodule (series 2/image 44). Musculoskeletal: There is widespread patchy sclerosis throughout the visualized thoracolumbar spine, sacrum, bilateral iliac bones and lower ribs bilaterally. Mild thoracolumbar spondylosis. Partially visualized left total hip arthroplasty. IMPRESSION: 1. Moderate diffuse intrahepatic biliary ductal dilatation. 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 2. Hypodense 2.1 cm mass at the posterior superior margin of the pancreatic neck with associated infiltrative soft tissue extending into the porta hepatis. No main pancreatic duct dilation. This mass could represent a primary  pancreatic adenocarcinoma or malignant peripancreatic lymph node. 3. Irregular annular wall thickening and hyperenhancement in the fundal gallbladder with loss of the normal fat plane between the fundal gallbladder and liver. Cannot exclude gallbladder carcinoma or serosal carcinomatosis in this location. 4. Left paracolic gutter peritoneal nodule suspicious for peritoneal carcinomatosis. 5. Suggestion of small bilateral adrenal nodules, cannot exclude adrenal metastases. 6. MRI of the abdomen with MRCP without and with IV contrast may be useful for further evaluation of the above findings. 7. 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 Recommend correlation with serum PSA. 8. Additional findings include aortic atherosclerosis and mild sigmoid diverticulosis. Electronically Signed   By: Ilona Sorrel M.D.   On: 07/13/2016 18:21   Mr 3d Recon At Scanner  Result Date: 07/14/2016 CLINICAL DATA:  Common bile duct stricture. EXAM: MRI ABDOMEN WITHOUT AND WITH CONTRAST (INCLUDING MRCP) TECHNIQUE: Multiplanar multisequence MR imaging of the abdomen was performed both before and after the administration of intravenous contrast. Heavily T2-weighted images of the biliary and pancreatic ducts were obtained, and three-dimensional MRCP images were rendered by post processing. CONTRAST:  7mL MULTIHANCE GADOBENATE DIMEGLUMINE 529 MG/ML IV SOLN COMPARISON:  CT 07/13/2016 has high FINDINGS: Lower chest:  Lung bases are clear. Hepatobiliary: There is intrahepatic and extrahepatic biliary duct dilatation. Stricturing of the bile duct at the junction of the common hepatic duct and common bile duct (image 12, series 9). More distal stricturing of the common hepatic duct (image 13, series 9). Both the strictures occur over approximately 1 cm segment. The common bile duct appears normal caliber at the ampullary level. Within the hepatic parenchyma. The bile ducts  have a mild beaded appearance. There is suggestion of sludge within the bile ducts of the RIGHT hepatic lobe (image 17, series  3 image 16 series 3). There is a cholesterol gallstone within the fundus of the gallbladder seen on the precontrast T1 weighted imaging (series 14). Pancreas: Along the ventral aspect of the pancreatic head there is a lesion which is either of the pancreas or adjacent the pancreas which is hypointense to normal pancreatic parenchyma measuring 20 mm image 35, series 4. This lesion is hypo enhancing on the arterial phase (image 34, series 15). Favor lesion to be and within the pancreatic parenchyma. There is no significant pancreatic duct dilatation. Spleen: Normal spleen. Adrenals/urinary tract: Thickening and adrenal glands which is incompletely characterized. Stomach/Bowel: Stomach and limited of the small bowel is unremarkable Vascular/Lymphatic: Abdominal aortic normal caliber. No retroperitoneal periportal lymphadenopathy. Musculoskeletal: Heterogeneous marrow signal within the entirety of the spine (image 19, series 189) para IMPRESSION: 1. Intrahepatic and extrahepatic duct dilatation with segments of bile duct stricturing in the common hepatic duct and common bile duct. Additionally there is mild beaded appearance of the intrahepatic ducts and cholestasis in the RIGHT hepatic lobe. Differential considerations include cholangiocarcinoma of the extrahepatic ducts, occult malignant stricturing of the extrahepatic ducts and primary sclerosing cholangitis with or without associated cholangiocarcinoma. 2. Lesion within or adjacent to the pancreatic head concerning for pancreatic neoplasm versus a malignant lymph node. Recommend endoscopic ultrasound with tissue sampling. Recommend tissue sampling of the extrahepatic ducts at same time. 3. Cholesterol gallstone within fundus of the gallbladder. 4. 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  Electronically Signed   By: Suzy Bouchard M.D.   On: 07/14/2016 11:21   Dg C-arm 61-120 Min-no Report  Result Date: 07/16/2016 CLINICAL DATA: stricture  C-ARM 61-120 MINUTES Fluoroscopy was utilized by the requesting physician.  No radiographic interpretation.   Mr Abd W/wo Cm/mrcp  Result Date: 07/14/2016 CLINICAL DATA:  Common bile duct stricture. EXAM: MRI ABDOMEN WITHOUT AND WITH CONTRAST (INCLUDING MRCP) TECHNIQUE: Multiplanar multisequence MR imaging of the abdomen was performed both before and after the administration of intravenous contrast. Heavily T2-weighted images of the biliary and pancreatic ducts were obtained, and three-dimensional MRCP images were rendered by post processing. CONTRAST:  19mL MULTIHANCE GADOBENATE DIMEGLUMINE 529 MG/ML IV SOLN COMPARISON:  CT 07/13/2016 has high FINDINGS: Lower chest:  Lung bases are clear. Hepatobiliary: There is intrahepatic and extrahepatic biliary duct dilatation. Stricturing of the bile duct at the junction of the common hepatic duct and common bile duct (image 12, series 9). More distal stricturing of the common hepatic duct (image 13, series 9). Both the strictures occur over approximately 1 cm segment. The common bile duct appears normal caliber at the ampullary level. Within the hepatic parenchyma. The bile ducts have a mild beaded appearance. There is suggestion of sludge within the bile ducts of the RIGHT hepatic lobe (image 17, series 3 image 16 series 3). There is a cholesterol gallstone within the fundus of the gallbladder seen on the precontrast T1 weighted imaging (series 14). Pancreas: Along the ventral aspect of the pancreatic head there is a lesion which is either of the pancreas or adjacent the pancreas which is hypointense to normal pancreatic parenchyma measuring 20 mm image 35, series 4. This lesion is hypo enhancing on the arterial phase (image 34, series 15). Favor lesion to be and within the pancreatic parenchyma. There is no  significant pancreatic duct dilatation. Spleen: Normal spleen. Adrenals/urinary tract: Thickening and adrenal glands which is incompletely characterized. Stomach/Bowel: Stomach and limited of the small bowel is unremarkable Vascular/Lymphatic: Abdominal aortic normal caliber. No  retroperitoneal periportal lymphadenopathy. Musculoskeletal: Heterogeneous marrow signal within the entirety of the spine (image 19, series 189) para IMPRESSION: 1. Intrahepatic and extrahepatic duct dilatation with segments of bile duct stricturing in the common hepatic duct and common bile duct. Additionally there is mild beaded appearance of the intrahepatic ducts and cholestasis in the RIGHT hepatic lobe. Differential considerations include cholangiocarcinoma of the extrahepatic ducts, occult malignant stricturing of the extrahepatic ducts and primary sclerosing cholangitis with or without associated cholangiocarcinoma. 2. Lesion within or adjacent to the pancreatic head concerning for pancreatic neoplasm versus a malignant lymph node. Recommend endoscopic ultrasound with tissue sampling. Recommend tissue sampling of the extrahepatic ducts at same time. 3. Cholesterol gallstone within fundus of the gallbladder. 4. 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 Electronically Signed   By: Suzy Bouchard M.D.   On: 07/14/2016 11:21   Ir Int Lianne Cure Biliary Drain With Cholangiogram  Result Date: 07/17/2016 INDICATION: BILIARY OBSTRUCTION, OBSTRUCTIVE JAUNDICE, UNSUCCESSFUL ERCP STENT INSERTION EXAM: IR INT-EXT BILIARY DRAIN W/ CHOLANGIOGRAM MEDICATIONS: 3.375 g Zosyn; The antibiotic was administered within an appropriate time frame prior to the initiation of the procedure. ANESTHESIA/SEDATION: Moderate (conscious) sedation was employed during this procedure. A total of Versed 2.0 mg and Fentanyl 200 mcg was administered intravenously. Moderate Sedation Time: 30 minutes. The patient's level of  consciousness and vital signs were monitored continuously by radiology nursing throughout the procedure under my direct supervision. FLUOROSCOPY TIME:  Fluoroscopy Time: 3 minutes 24 seconds (73 mGy). COMPLICATIONS: None immediate. PROCEDURE: Informed written consent was obtained from the patient after a thorough discussion of the procedural risks, benefits and alternatives. All questions were addressed. Maximal Sterile Barrier Technique was utilized including caps, mask, sterile gowns, sterile gloves, sterile drape, hand hygiene and skin antiseptic. A timeout was performed prior to the initiation of the procedure. Previous imaging reviewed. Under sterile conditions and local anesthesia, ultrasound percutaneous needle access performed of a peripheral right hepatic duct in the mid axillary line through a lower intercostal space. There was return of bile. Contrast injection performed for a cholangiogram. Cholangiogram confirms diffuse biliary obstruction and marked dilatation. 018 guidewire advanced followed by the Accustick dilator set. Outer dilator advanced into the distal CBD. Contrast injection confirms position. Amplatz guidewire advanced into the duodenum. Tract dilatation performed to advance a 10 [place] internal external biliary drain. Retention loop formed in the duodenum. Peripheral marker within the right hepatic duct. Contrast injection confirms drainage of the right and left ducts adequately. Gravity drainage bag connected. Catheter secured with a Prolene suture and a sterile dressing. No immediate complication. Patient tolerated the procedure well. IMPRESSION: Successful ultrasound and fluoroscopic 10 French right internal external biliary drain. Electronically Signed   By: Jerilynn Mages.  Shick M.D.   On: 07/17/2016 18:27       Subjective:   Discharge Exam: Vitals:   07/19/16 2047 07/20/16 0513  BP: (!) 154/83 (!) 155/86  Pulse: 84 72  Resp: 18 16  Temp: 97.7 F (36.5 C) 98.1 F (36.7 C)    Vitals:   07/19/16 1500 07/19/16 1834 07/19/16 2047 07/20/16 0513  BP: (!) 156/86 (!) 133/98 (!) 154/83 (!) 155/86  Pulse: 80  84 72  Resp: 20  18 16   Temp:   97.7 F (36.5 C) 98.1 F (36.7 C)  TempSrc:   Oral Oral  SpO2: 94%  95% 93%  Weight:      Height:        General: Pt is alert, awake, not in acute  distress Cardiovascular: RRR, S1/S2 +, no rubs, no gallops Respiratory: CTA bilaterally, no wheezing, no rhonchi Abdominal: Soft, NT, ND, bowel sounds + Extremities: no edema, no cyanosis    The results of significant diagnostics from this hospitalization (including imaging, microbiology, ancillary and laboratory) are listed below for reference.     Microbiology: Recent Results (from the past 240 hour(s))  Aerobic/Anaerobic Culture (surgical/deep wound)     Status: None (Preliminary result)   Collection Time: 07/17/16  6:19 PM  Result Value Ref Range Status   Specimen Description BILE  Final   Special Requests Normal  Final   Gram Stain NO WBC SEEN NO ORGANISMS SEEN   Final   Culture   Final    CULTURE REINCUBATED FOR BETTER GROWTH Performed at [facility]    Report Status PENDING  Incomplete     Labs: BNP (last 3 results) No results for input(s): BNP in the last 8760 hours. Basic Metabolic Panel:  Recent Labs Lab 07/13/16 1335 07/14/16 0405 07/15/16 1623 07/16/16 0519 07/17/16 1217  NA 133* 135 136 133* 134*  K 3.5 3.4* 3.1* 3.6 3.7  CL 93* 98* 101 98* 99*  CO2 31 27 26 27 28   GLUCOSE 115* 99 133* 123* 99  BUN 8 9 10 6 9   CREATININE 0.90 0.76 0.46* 0.60* 0.53*  CALCIUM 10.4* 9.5 9.3 9.4 9.5   Liver Function Tests:  Recent Labs Lab 07/13/16 1335 07/17/16 1217  AST 203* 115*  ALT 323* 204*  ALKPHOS 896* 749*  BILITOT 6.7* 6.7*  PROT 8.2* 6.2*  ALBUMIN 4.0 2.9*    Recent Labs Lab 07/13/16 1335  LIPASE 26   No results for input(s): AMMONIA in the last 168 hours. CBC:  Recent Labs Lab 07/13/16 1335 07/14/16 0405  07/15/16 1623 07/16/16 0519  WBC 10.2 10.0 10.3 9.8  HGB 17.9 16.1 15.0 15.5  HCT 52.2* 48.2 43.0 44.5  MCV 89.7 90.7 87.2 86.7  PLT 254 209 235 244   Cardiac Enzymes: No results for input(s): CKTOTAL, CKMB, CKMBINDEX, TROPONINI in the last 168 hours. BNP: Invalid input(s): POCBNP CBG: No results for input(s): GLUCAP in the last 168 hours. D-Dimer No results for input(s): DDIMER in the last 72 hours. Hgb A1c No results for input(s): HGBA1C in the last 72 hours. Lipid Profile No results for input(s): CHOL, HDL, LDLCALC, TRIG, CHOLHDL, LDLDIRECT in the last 72 hours. Thyroid function studies No results for input(s): TSH, T4TOTAL, T3FREE, THYROIDAB in the last 72 hours.  Invalid input(s): FREET3 Anemia work up No results for input(s): VITAMINB12, FOLATE, FERRITIN, TIBC, IRON, RETICCTPCT in the last 72 hours. Urinalysis    Component Value Date/Time   COLORURINE AMBER (A) 07/13/2016 1335   APPEARANCEUR CLEAR (A) 07/13/2016 1335   LABSPEC 1.013 07/13/2016 1335   PHURINE 5.0 07/13/2016 1335   GLUCOSEU NEGATIVE 07/13/2016 1335   HGBUR 1+ (A) 07/13/2016 1335   BILIRUBINUR 2+ (A) 07/13/2016 1335   KETONESUR NEGATIVE 07/13/2016 1335   PROTEINUR NEGATIVE 07/13/2016 1335   UROBILINOGEN 1.0 07/17/2013 1458   NITRITE NEGATIVE 07/13/2016 1335   LEUKOCYTESUR NEGATIVE 07/13/2016 1335   Sepsis Labs Invalid input(s): PROCALCITONIN,  WBC,  LACTICIDVEN Microbiology Recent Results (from the past 240 hour(s))  Aerobic/Anaerobic Culture (surgical/deep wound)     Status: None (Preliminary result)   Collection Time: 07/17/16  6:19 PM  Result Value Ref Range Status   Specimen Description BILE  Final   Special Requests Normal  Final   Gram Stain NO WBC SEEN NO  ORGANISMS SEEN   Final   Culture   Final    CULTURE REINCUBATED FOR BETTER GROWTH Performed at [facility]    Report Status PENDING  Incomplete     Time coordinating discharge: Over 30 minutes  SIGNED:   Charlynne Cousins, MD  [facility] 07/20/2016, 8:09 AM Pager   If 7PM-7AM, please contact night-coverage [email or web address] Password [email or web address]

## 2016-07-20 NOTE — Progress Notes (Signed)
Patient is here for follow up  

## 2016-07-21 ENCOUNTER — Encounter (HOSPITAL_COMMUNITY): Payer: Self-pay | Admitting: Gastroenterology

## 2016-07-21 LAB — CANCER ANTIGEN 19-9: CA 19-9: 173 U/mL — ABNORMAL HIGH (ref 0–35)

## 2016-07-22 ENCOUNTER — Other Ambulatory Visit: Payer: Self-pay | Admitting: *Deleted

## 2016-07-22 ENCOUNTER — Encounter
Admission: RE | Admit: 2016-07-22 | Discharge: 2016-07-22 | Disposition: A | Discharge status: Home / Self Care | Payer: No Typology Code available for payment source | Source: Ambulatory Visit | Attending: Oncology | Admitting: Oncology

## 2016-07-22 DIAGNOSIS — K8689 Other specified diseases of pancreas: Secondary | ICD-10-CM

## 2016-07-22 DIAGNOSIS — C7951 Secondary malignant neoplasm of bone: Secondary | ICD-10-CM

## 2016-07-22 DIAGNOSIS — K869 Disease of pancreas, unspecified: Secondary | ICD-10-CM | POA: Insufficient documentation

## 2016-07-22 LAB — GLUCOSE, CAPILLARY: GLUCOSE-CAPILLARY: 139 mg/dL — AB (ref 65–99)

## 2016-07-22 MED ORDER — FLUDEOXYGLUCOSE F - 18 (FDG) INJECTION
13.1100 | Freq: Once | INTRAVENOUS | Status: AC | PRN
  Administered 2016-07-22: 13.11 via INTRAVENOUS

## 2016-07-22 NOTE — Progress Notes (Signed)
[place]  [phone] [phone]  ID: Francisco Charles OB: 03-Feb-1951  MR#: [id number]  [id number]  Patient Care Team: Birdie Sons, MD as PCP - General (Family Medicine) Murrell Redden, MD as Consulting Physician (Urology) Manya Silvas, MD (Gastroenterology) Clent Jacks, RN as Registered Nurse  CHIEF COMPLAINT: Pancreatic mass  INTERVAL HISTORY: Patient returns to clinic for hospital follow-up and further evaluation. Patient was transferred from [facility] to [facility] noted have ERCP. This procedure was unsuccessful and patient required percutaneous drainage to bypass his obstructed bile duct. He continues to have abdominal pain, but otherwise feels well. His no neurologic complaints. He denies any recent fevers. He has a poor appetite, but denies weight loss. He denies any chest pain, shortness breath, or cough. He denies any melena or hematochezia. He has no urinary complaints. Patient otherwise feels well and offers no further specific complaints.   REVIEW OF SYSTEMS:   Review of Systems  Constitutional: Positive for malaise/fatigue. Negative for fever and weight loss.  Respiratory: Negative.  Negative for cough and shortness of breath.   Cardiovascular: Negative.  Negative for chest pain.  Gastrointestinal: Positive for abdominal pain. Negative for blood in stool, constipation, diarrhea, melena, nausea and vomiting.  Genitourinary: Negative.   Musculoskeletal: Negative.   Neurological: Positive for weakness.  Psychiatric/Behavioral: Negative.     As per HPI. Otherwise, a complete review of systems is negatve.  PAST MEDICAL HISTORY: Past Medical History:  Diagnosis Date  . Carpal tunnel syndrome   . History of measles   . Hyperplastic colonic polyp 2006   Excised Dr. Tiffany Kocher  . Hypertension   . Osteoarthrosis   . Prostate nodule     PAST SURGICAL HISTORY: Past Surgical History:  Procedure Laterality Date  . ERCP N/A  07/16/2016   Procedure: ENDOSCOPIC RETROGRADE CHOLANGIOPANCREATOGRAPHY (ERCP);  Surgeon: Carol Ada, MD;  Location: [facility];  Service: Endoscopy;  Laterality: N/A;  . EUS Left 07/19/2016   Procedure: UPPER ENDOSCOPIC ULTRASOUND (EUS) LINEAR;  Surgeon: Carol Ada, MD;  Location: [facility];  Service: Endoscopy;  Laterality: Left;  . IR GENERIC HISTORICAL  07/17/2016   IR INT EXT BILIARY DRAIN WITH CHOLANGIOGRAM 07/17/2016 Greggory Keen, MD [facility]-INTER[facility] RAD  . JOINT REPLACEMENT Left 10-31-2000  . PROSTATE SURGERY  09/23/2011   Prostate Biopsy Dr. Jacqlyn Larsen  . TOTAL HIP ARTHROPLASTY Left 2001  . TOTAL HIP REVISION Left 07/23/2013   Procedure: LEFT TOTAL HIP REVISION ARTHROPLASTY WITH BONE GRAFT;  Surgeon: Mauri Pole, MD;  Location: [facility];  Service: Orthopedics;  Laterality: Left;    FAMILY HISTORY: Family History  Problem Relation Age of Onset  . Heart attack Mother   . Cancer Mother   . Heart attack Brother   . Diabetes Neg Hx   . Heart disease Neg Hx        ADVANCED DIRECTIVES (Y/N):  N   HEALTH MAINTENANCE: Social History  Substance Use Topics  . Smoking status: Current Every Day Smoker    Packs/day: 0.50    Years: 30.00    Types: Cigarettes    Last attempt to quit: 11/29/2012  . Smokeless tobacco: Never Used  . Alcohol use No     Colonoscopy:  PAP:  Bone density:  Lipid panel:  No Known Allergies  Current Outpatient Prescriptions  Medication Sig Dispense Refill  . aspirin EC 81 MG tablet Take 81 mg by mouth daily.    . feeding supplement (BOOST / RESOURCE BREEZE) LIQD Take 1 Container  by mouth 3 (three) times daily between meals. 30 Container 0  . methocarbamol (ROBAXIN) 750 MG tablet 1-2 TABLETS UP TO 4 TIMES/DAY IF NEEDED FOR MUSCLE PAIN AND SPASM  0  . omeprazole (PRILOSEC) 20 MG capsule Take 20 mg by mouth daily.     . quinapril-hydrochlorothiazide (ACCURETIC) 20-12.5 MG tablet TAKE 1 TABLET BY MOUTH DAILY. 30 tablet 3  . traMADol (ULTRAM) 50 MG tablet Take  2 tablets (100 mg total) by mouth every 6 (six) hours as needed for moderate pain. 30 tablet 0   No current facility-administered medications for this visit.     OBJECTIVE: Vitals:   07/20/16 1600  BP: 130/87  Pulse: 99  Resp: 18  Temp: 97.9 F (36.6 C)     Body mass index is 23.81 kg/m.    ECOG FS:0 - Asymptomatic  General: Well-developed, well-nourished, no acute distress. Eyes: Pink conjunctiva, anicteric sclera. HEENT: Normocephalic, moist mucous membranes, clear oropharnyx. Lungs: Clear to auscultation bilaterally. Heart: Regular rate and rhythm. No rubs, murmurs, or gallops. Abdomen: Soft, nontender, nondistended. No organomegaly noted, normoactive bowel sounds. Percutaneous biliary drain noted. Musculoskeletal: No edema, cyanosis, or clubbing. Neuro: Alert, answering all questions appropriately. Cranial nerves grossly intact. Skin: No rashes or petechiae noted. Psych: Normal affect. Lymphatics: No cervical, calvicular, axillary or inguinal LAD.   LAB RESULTS:  Lab Results  Component Value Date   NA 132 (L) 07/20/2016   K 3.8 07/20/2016   CL 93 (L) 07/20/2016   CO2 24 07/20/2016   GLUCOSE 127 (H) 07/20/2016   BUN 16 07/20/2016   CREATININE 0.83 07/20/2016   CALCIUM 10.8 (H) 07/20/2016   PROT 8.1 07/20/2016   ALBUMIN 3.5 07/20/2016   AST 75 (H) 07/20/2016   ALT 150 (H) 07/20/2016   ALKPHOS 752 (H) 07/20/2016   BILITOT 4.3 (H) 07/20/2016   GFRNONAA >60 07/20/2016   GFRAA >60 07/20/2016    Lab Results  Component Value Date   WBC 13.6 (H) 07/20/2016   NEUTROABS 11.5 (H) 07/20/2016   HGB 17.6 07/20/2016   HCT 51.2 07/20/2016   MCV 88.4 07/20/2016   PLT 292 07/20/2016   Lab Results  Component Value Date   CA199 173 (H) 07/20/2016     STUDIES: Ct Abdomen Pelvis W Contrast  Result Date: 07/13/2016 CLINICAL DATA:  65 year old male with right abdominal pain, most prominent in the right lower quadrant. Elevated liver function tests. Postprandial nausea.  History of prostate biopsy. EXAM: CT ABDOMEN AND PELVIS WITH CONTRAST TECHNIQUE: Multidetector CT imaging of the abdomen and pelvis was performed using the standard protocol following bolus administration of intravenous contrast. CONTRAST:  129mL ISOVUE-300 IOPAMIDOL (ISOVUE-300) INJECTION 61% COMPARISON:  09/29/2012 bone protocol CT pelvis. No prior CT abdomen/pelvis. FINDINGS: Lower chest: No significant pulmonary nodules or acute consolidative airspace disease. Hepatobiliary: Normal liver size. Simple 0.8 cm liver cyst in segment 8 of the right liver lobe. No additional discrete liver lesions. There is slightly irregular annular wall thickening and mucosal hyperenhancement in the fundal gallbladder wall, with loss of the normal fat plane between the fundal gallbladder wall and liver (series 2/ image 32). No radiopaque cholelithiasis. No significant gallbladder distention. No pericholecystic fluid. There is moderate diffuse intrahepatic biliary ductal dilatation. There is a focal stricture in the upper third of the common bile duct with associated common bile duct wall thickening and hyperenhancement (best seen on coronal series 5/ image 46). Pancreas: No pancreatic duct dilation. There is a heterogeneous hypodense 2.1 x 1.5 cm mass at the  posterior superior margin of the pancreatic neck (series 2/ image 29), which demonstrates ill-defined margins with infiltrative soft tissue extending into the porta hepatis along the common bile duct. Spleen: Normal size. No mass. Adrenals/Urinary Tract: There is irregular thickening of the bilateral adrenal glands, with the suggestion of small adrenal nodules bilaterally measuring 1.0 cm on the right (series 2/ image 24) and 1.3 cm on the left (series 2/ image 25). No hydronephrosis. Simple 2.0 cm interpolar left renal cyst. Simple 1.0 cm lower right renal cyst. No additional renal lesions. Limited visualization of the collapsed bladder due to streak artifact from left hip  hardware with no gross bladder abnormality . Stomach/Bowel: Grossly normal stomach. Normal caliber small bowel with no small bowel wall thickening. Normal appendix . Mild sigmoid diverticulosis, with no large bowel wall thickening or pericolonic fat stranding. Vascular/Lymphatic: Atherosclerotic nonaneurysmal abdominal aorta. Patent portal, splenic, hepatic and renal veins. No pathologically enlarged lymph nodes in the abdomen or pelvis. Reproductive: Poorly visualized prostate due to streak artifact from the left hip hardware. Prostate appears mildly enlarged. Other: No pneumoperitoneum. No ascites. No focal fluid collection. There is a heterogeneously enhancing 1.5 x 1.4 cm left paracolic gutter peritoneal nodule (series 2/image 44). Musculoskeletal: There is widespread patchy sclerosis throughout the visualized thoracolumbar spine, sacrum, bilateral iliac bones and lower ribs bilaterally. Mild thoracolumbar spondylosis. Partially visualized left total hip arthroplasty. IMPRESSION: 1. Moderate diffuse intrahepatic biliary ductal dilatation. 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 2. Hypodense 2.1 cm mass at the posterior superior margin of the pancreatic neck with associated infiltrative soft tissue extending into the porta hepatis. No main pancreatic duct dilation. This mass could represent a primary pancreatic adenocarcinoma or malignant peripancreatic lymph node. 3. Irregular annular wall thickening and hyperenhancement in the fundal gallbladder with loss of the normal fat plane between the fundal gallbladder and liver. Cannot exclude gallbladder carcinoma or serosal carcinomatosis in this location. 4. Left paracolic gutter peritoneal nodule suspicious for peritoneal carcinomatosis. 5. Suggestion of small bilateral adrenal nodules, cannot exclude adrenal metastases. 6. MRI of the abdomen with MRCP without and with IV contrast may be useful for further evaluation of the above  findings. 7. 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 Recommend correlation with serum PSA. 8. Additional findings include aortic atherosclerosis and mild sigmoid diverticulosis. Electronically Signed   By: Ilona Sorrel M.D.   On: 07/13/2016 18:21   Mr 3d Recon At Scanner  Result Date: 07/14/2016 CLINICAL DATA:  Common bile duct stricture. EXAM: MRI ABDOMEN WITHOUT AND WITH CONTRAST (INCLUDING MRCP) TECHNIQUE: Multiplanar multisequence MR imaging of the abdomen was performed both before and after the administration of intravenous contrast. Heavily T2-weighted images of the biliary and pancreatic ducts were obtained, and three-dimensional MRCP images were rendered by post processing. CONTRAST:  18mL MULTIHANCE GADOBENATE DIMEGLUMINE 529 MG/ML IV SOLN COMPARISON:  CT 07/13/2016 has high FINDINGS: Lower chest:  Lung bases are clear. Hepatobiliary: There is intrahepatic and extrahepatic biliary duct dilatation. Stricturing of the bile duct at the junction of the common hepatic duct and common bile duct (image 12, series 9). More distal stricturing of the common hepatic duct (image 13, series 9). Both the strictures occur over approximately 1 cm segment. The common bile duct appears normal caliber at the ampullary level. Within the hepatic parenchyma. The bile ducts have a mild beaded appearance. There is suggestion of sludge within the bile ducts of the RIGHT hepatic lobe (image 17,  series 3 image 16 series 3). There is a cholesterol gallstone within the fundus of the gallbladder seen on the precontrast T1 weighted imaging (series 14). Pancreas: Along the ventral aspect of the pancreatic head there is a lesion which is either of the pancreas or adjacent the pancreas which is hypointense to normal pancreatic parenchyma measuring 20 mm image 35, series 4. This lesion is hypo enhancing on the arterial phase (image 34, series 15). Favor lesion to  be and within the pancreatic parenchyma. There is no significant pancreatic duct dilatation. Spleen: Normal spleen. Adrenals/urinary tract: Thickening and adrenal glands which is incompletely characterized. Stomach/Bowel: Stomach and limited of the small bowel is unremarkable Vascular/Lymphatic: Abdominal aortic normal caliber. No retroperitoneal periportal lymphadenopathy. Musculoskeletal: Heterogeneous marrow signal within the entirety of the spine (image 19, series 189) para IMPRESSION: 1. Intrahepatic and extrahepatic duct dilatation with segments of bile duct stricturing in the common hepatic duct and common bile duct. Additionally there is mild beaded appearance of the intrahepatic ducts and cholestasis in the RIGHT hepatic lobe. Differential considerations include cholangiocarcinoma of the extrahepatic ducts, occult malignant stricturing of the extrahepatic ducts and primary sclerosing cholangitis with or without associated cholangiocarcinoma. 2. Lesion within or adjacent to the pancreatic head concerning for pancreatic neoplasm versus a malignant lymph node. Recommend endoscopic ultrasound with tissue sampling. Recommend tissue sampling of the extrahepatic ducts at same time. 3. Cholesterol gallstone within fundus of the gallbladder. 4. 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 Electronically Signed   By: Suzy Bouchard M.D.   On: 07/14/2016 11:21   Dg C-arm 61-120 Min-no Report  Result Date: 07/16/2016 CLINICAL DATA: stricture  C-ARM 61-120 MINUTES Fluoroscopy was utilized by the requesting physician.  No radiographic interpretation.   Mr Abd W/wo Cm/mrcp  Result Date: 07/14/2016 CLINICAL DATA:  Common bile duct stricture. EXAM: MRI ABDOMEN WITHOUT AND WITH CONTRAST (INCLUDING MRCP) TECHNIQUE: Multiplanar multisequence MR imaging of the abdomen was performed both before and after the administration of intravenous contrast. Heavily T2-weighted images of  the biliary and pancreatic ducts were obtained, and three-dimensional MRCP images were rendered by post processing. CONTRAST:  10mL MULTIHANCE GADOBENATE DIMEGLUMINE 529 MG/ML IV SOLN COMPARISON:  CT 07/13/2016 has high FINDINGS: Lower chest:  Lung bases are clear. Hepatobiliary: There is intrahepatic and extrahepatic biliary duct dilatation. Stricturing of the bile duct at the junction of the common hepatic duct and common bile duct (image 12, series 9). More distal stricturing of the common hepatic duct (image 13, series 9). Both the strictures occur over approximately 1 cm segment. The common bile duct appears normal caliber at the ampullary level. Within the hepatic parenchyma. The bile ducts have a mild beaded appearance. There is suggestion of sludge within the bile ducts of the RIGHT hepatic lobe (image 17, series 3 image 16 series 3). There is a cholesterol gallstone within the fundus of the gallbladder seen on the precontrast T1 weighted imaging (series 14). Pancreas: Along the ventral aspect of the pancreatic head there is a lesion which is either of the pancreas or adjacent the pancreas which is hypointense to normal pancreatic parenchyma measuring 20 mm image 35, series 4. This lesion is hypo enhancing on the arterial phase (image 34, series 15). Favor lesion to be and within the pancreatic parenchyma. There is no significant pancreatic duct dilatation. Spleen: Normal spleen. Adrenals/urinary tract: Thickening and adrenal glands which is incompletely characterized. Stomach/Bowel: Stomach and limited of the small bowel is unremarkable Vascular/Lymphatic: Abdominal aortic normal caliber.  No retroperitoneal periportal lymphadenopathy. Musculoskeletal: Heterogeneous marrow signal within the entirety of the spine (image 19, series 189) para IMPRESSION: 1. Intrahepatic and extrahepatic duct dilatation with segments of bile duct stricturing in the common hepatic duct and common bile duct. Additionally there is  mild beaded appearance of the intrahepatic ducts and cholestasis in the RIGHT hepatic lobe. Differential considerations include cholangiocarcinoma of the extrahepatic ducts, occult malignant stricturing of the extrahepatic ducts and primary sclerosing cholangitis with or without associated cholangiocarcinoma. 2. Lesion within or adjacent to the pancreatic head concerning for pancreatic neoplasm versus a malignant lymph node. Recommend endoscopic ultrasound with tissue sampling. Recommend tissue sampling of the extrahepatic ducts at same time. 3. Cholesterol gallstone within fundus of the gallbladder. 4. 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 Electronically Signed   By: Suzy Bouchard M.D.   On: 07/14/2016 11:21   Ir Int Lianne Cure Biliary Drain With Cholangiogram  Result Date: 07/17/2016 INDICATION: BILIARY OBSTRUCTION, OBSTRUCTIVE JAUNDICE, UNSUCCESSFUL ERCP STENT INSERTION EXAM: IR INT-EXT BILIARY DRAIN W/ CHOLANGIOGRAM MEDICATIONS: 3.375 g Zosyn; The antibiotic was administered within an appropriate time frame prior to the initiation of the procedure. ANESTHESIA/SEDATION: Moderate (conscious) sedation was employed during this procedure. A total of Versed 2.0 mg and Fentanyl 200 mcg was administered intravenously. Moderate Sedation Time: 30 minutes. The patient's level of consciousness and vital signs were monitored continuously by radiology nursing throughout the procedure under my direct supervision. FLUOROSCOPY TIME:  Fluoroscopy Time: 3 minutes 24 seconds (73 mGy). COMPLICATIONS: None immediate. PROCEDURE: Informed written consent was obtained from the patient after a thorough discussion of the procedural risks, benefits and alternatives. All questions were addressed. Maximal Sterile Barrier Technique was utilized including caps, mask, sterile gowns, sterile gloves, sterile drape, hand hygiene and skin antiseptic. A timeout was performed prior to the initiation  of the procedure. Previous imaging reviewed. Under sterile conditions and local anesthesia, ultrasound percutaneous needle access performed of a peripheral right hepatic duct in the mid axillary line through a lower intercostal space. There was return of bile. Contrast injection performed for a cholangiogram. Cholangiogram confirms diffuse biliary obstruction and marked dilatation. 018 guidewire advanced followed by the Accustick dilator set. Outer dilator advanced into the distal CBD. Contrast injection confirms position. Amplatz guidewire advanced into the duodenum. Tract dilatation performed to advance a 10 [place] internal external biliary drain. Retention loop formed in the duodenum. Peripheral marker within the right hepatic duct. Contrast injection confirms drainage of the right and left ducts adequately. Gravity drainage bag connected. Catheter secured with a Prolene suture and a sterile dressing. No immediate complication. Patient tolerated the procedure well. IMPRESSION: Successful ultrasound and fluoroscopic 10 French right internal external biliary drain. Electronically Signed   By: Jerilynn Mages.  Shick M.D.   On: 07/17/2016 18:27    ASSESSMENT: Pancreatic mass  PLAN:    1. Pancreatic mass: Imaging results reviewed independently and reported as above. Although this is highly suspicious for malignancy, his CA-19-9 has slightly trended down and cytology from his recent procedure was negative for malignant cells. We will get PET scan to further evaluate, although that if this is chronic information PET scan may not be helpful. We will also repeat CA-19-9 at next clinic visit. Plan on discussing case at case conference to assess whether CT-guided biopsy to obtain more tissue is warranted. Return to clinic in 1 week for further evaluation. 2. Elevated bilirubin: Trending down, patient has percutaneous drain in place. 3. Elevated liver enzymes: Patient's AST and ALT are  now trending down, monitor.  Patient  expressed understanding and was in agreement with this plan. He also understands that He can call clinic at any time with any questions, concerns, or complaints.   No matching staging information was found for the patient.  Lloyd Huger, MD   07/22/2016 9:08 AM

## 2016-07-23 LAB — AEROBIC/ANAEROBIC CULTURE W GRAM STAIN (SURGICAL/DEEP WOUND)
Gram Stain: NONE SEEN
Special Requests: NORMAL

## 2016-07-23 LAB — AEROBIC/ANAEROBIC CULTURE (SURGICAL/DEEP WOUND)

## 2016-07-27 ENCOUNTER — Emergency Department: Payer: No Typology Code available for payment source

## 2016-07-27 ENCOUNTER — Encounter: Payer: No Typology Code available for payment source | Admitting: Family Medicine

## 2016-07-27 ENCOUNTER — Encounter: Payer: Self-pay | Admitting: Emergency Medicine

## 2016-07-27 ENCOUNTER — Inpatient Hospital Stay
Admission: EM | Admit: 2016-07-27 | Discharge: 2016-07-31 | DRG: 435 | Disposition: A | Discharge status: Hospice | Payer: No Typology Code available for payment source | Attending: Internal Medicine | Admitting: Internal Medicine

## 2016-07-27 DIAGNOSIS — Z8719 Personal history of other diseases of the digestive system: Secondary | ICD-10-CM | POA: Diagnosis not present

## 2016-07-27 DIAGNOSIS — R778 Other specified abnormalities of plasma proteins: Secondary | ICD-10-CM

## 2016-07-27 DIAGNOSIS — N179 Acute kidney failure, unspecified: Secondary | ICD-10-CM | POA: Diagnosis present

## 2016-07-27 DIAGNOSIS — R109 Unspecified abdominal pain: Secondary | ICD-10-CM

## 2016-07-27 DIAGNOSIS — R4182 Altered mental status, unspecified: Secondary | ICD-10-CM | POA: Diagnosis not present

## 2016-07-27 DIAGNOSIS — Z809 Family history of malignant neoplasm, unspecified: Secondary | ICD-10-CM

## 2016-07-27 DIAGNOSIS — Z66 Do not resuscitate: Secondary | ICD-10-CM | POA: Diagnosis present

## 2016-07-27 DIAGNOSIS — F17210 Nicotine dependence, cigarettes, uncomplicated: Secondary | ICD-10-CM | POA: Diagnosis present

## 2016-07-27 DIAGNOSIS — I10 Essential (primary) hypertension: Secondary | ICD-10-CM | POA: Diagnosis present

## 2016-07-27 DIAGNOSIS — Z79899 Other long term (current) drug therapy: Secondary | ICD-10-CM

## 2016-07-27 DIAGNOSIS — Z515 Encounter for palliative care: Secondary | ICD-10-CM | POA: Diagnosis present

## 2016-07-27 DIAGNOSIS — R5383 Other fatigue: Secondary | ICD-10-CM

## 2016-07-27 DIAGNOSIS — R7989 Other specified abnormal findings of blood chemistry: Secondary | ICD-10-CM

## 2016-07-27 DIAGNOSIS — E43 Unspecified severe protein-calorie malnutrition: Secondary | ICD-10-CM | POA: Diagnosis present

## 2016-07-27 DIAGNOSIS — Z7982 Long term (current) use of aspirin: Secondary | ICD-10-CM

## 2016-07-27 DIAGNOSIS — I248 Other forms of acute ischemic heart disease: Secondary | ICD-10-CM | POA: Diagnosis present

## 2016-07-27 DIAGNOSIS — G9341 Metabolic encephalopathy: Secondary | ICD-10-CM | POA: Diagnosis present

## 2016-07-27 DIAGNOSIS — A419 Sepsis, unspecified organism: Secondary | ICD-10-CM

## 2016-07-27 DIAGNOSIS — Z8249 Family history of ischemic heart disease and other diseases of the circulatory system: Secondary | ICD-10-CM

## 2016-07-27 DIAGNOSIS — R531 Weakness: Secondary | ICD-10-CM

## 2016-07-27 DIAGNOSIS — D72829 Elevated white blood cell count, unspecified: Secondary | ICD-10-CM

## 2016-07-27 DIAGNOSIS — Z6820 Body mass index (BMI) 20.0-20.9, adult: Secondary | ICD-10-CM | POA: Diagnosis not present

## 2016-07-27 DIAGNOSIS — C787 Secondary malignant neoplasm of liver and intrahepatic bile duct: Secondary | ICD-10-CM | POA: Diagnosis present

## 2016-07-27 DIAGNOSIS — R630 Anorexia: Secondary | ICD-10-CM

## 2016-07-27 DIAGNOSIS — Z96642 Presence of left artificial hip joint: Secondary | ICD-10-CM | POA: Diagnosis present

## 2016-07-27 DIAGNOSIS — C259 Malignant neoplasm of pancreas, unspecified: Principal | ICD-10-CM

## 2016-07-27 DIAGNOSIS — C801 Malignant (primary) neoplasm, unspecified: Secondary | ICD-10-CM

## 2016-07-27 DIAGNOSIS — E860 Dehydration: Secondary | ICD-10-CM | POA: Diagnosis present

## 2016-07-27 DIAGNOSIS — M1990 Unspecified osteoarthritis, unspecified site: Secondary | ICD-10-CM

## 2016-07-27 DIAGNOSIS — Z789 Other specified health status: Secondary | ICD-10-CM

## 2016-07-27 HISTORY — DX: Malignant (primary) neoplasm, unspecified: C80.1

## 2016-07-27 LAB — CBC WITH DIFFERENTIAL/PLATELET
BASOS ABS: 0.1 10*3/uL (ref 0–0.1)
BASOS PCT: 0 %
EOS ABS: 0 10*3/uL (ref 0–0.7)
EOS PCT: 0 %
HCT: 53.6 % — ABNORMAL HIGH (ref 40.0–52.0)
Hemoglobin: 18.1 g/dL — ABNORMAL HIGH (ref 13.0–18.0)
Lymphocytes Relative: 5 %
Lymphs Abs: 0.9 10*3/uL — ABNORMAL LOW (ref 1.0–3.6)
MCH: 30 pg (ref 26.0–34.0)
MCHC: 33.7 g/dL (ref 32.0–36.0)
MCV: 88.9 fL (ref 80.0–100.0)
MONO ABS: 1.3 10*3/uL — AB (ref 0.2–1.0)
MONOS PCT: 7 %
Neutro Abs: 17.6 10*3/uL — ABNORMAL HIGH (ref 1.4–6.5)
Neutrophils Relative %: 88 %
PLATELETS: 301 10*3/uL (ref 150–440)
RBC: 6.02 MIL/uL — ABNORMAL HIGH (ref 4.40–5.90)
RDW: 15 % — AB (ref 11.5–14.5)
WBC: 19.9 10*3/uL — ABNORMAL HIGH (ref 3.8–10.6)

## 2016-07-27 LAB — COMPREHENSIVE METABOLIC PANEL
ALK PHOS: 704 U/L — AB (ref 38–126)
ALT: 171 U/L — AB (ref 17–63)
AST: 90 U/L — AB (ref 15–41)
Albumin: 3.6 g/dL (ref 3.5–5.0)
Anion gap: 14 (ref 5–15)
BILIRUBIN TOTAL: 4.7 mg/dL — AB (ref 0.3–1.2)
BUN: 44 mg/dL — AB (ref 6–20)
CALCIUM: 12.4 mg/dL — AB (ref 8.9–10.3)
CO2: 28 mmol/L (ref 22–32)
CREATININE: 1.38 mg/dL — AB (ref 0.61–1.24)
Chloride: 90 mmol/L — ABNORMAL LOW (ref 101–111)
GFR calc Af Amer: >60 mL/min (ref >60)
GFR, EST NON AFRICAN AMERICAN: 53 mL/min — AB (ref >60)
GLUCOSE: 167 mg/dL — AB (ref 65–99)
POTASSIUM: 4.2 mmol/L (ref 3.5–5.1)
Sodium: 132 mmol/L — ABNORMAL LOW (ref 135–145)
TOTAL PROTEIN: 8 g/dL (ref 6.5–8.1)

## 2016-07-27 LAB — URINALYSIS COMPLETE WITH MICROSCOPIC (ARMC ONLY)
BILIRUBIN URINE: NEGATIVE
GLUCOSE, UA: 50 mg/dL — AB
HGB URINE DIPSTICK: NEGATIVE
Ketones, ur: NEGATIVE mg/dL
LEUKOCYTES UA: NEGATIVE
Nitrite: NEGATIVE
PH: 5 (ref 5.0–8.0)
Protein, ur: NEGATIVE mg/dL
Specific Gravity, Urine: 1.019 (ref 1.005–1.030)

## 2016-07-27 LAB — LIPASE, BLOOD: LIPASE: 41 U/L (ref 11–51)

## 2016-07-27 LAB — TROPONIN I: TROPONIN I: 0.11 ng/mL — AB (ref <0.03)

## 2016-07-27 LAB — AMMONIA: AMMONIA: 31 umol/L (ref 9–35)

## 2016-07-27 LAB — PROTIME-INR
INR: 1.32
PROTHROMBIN TIME: 16.5 s — AB (ref 11.4–15.2)

## 2016-07-27 LAB — LACTIC ACID, PLASMA: Lactic Acid, Venous: 1.9 mmol/L (ref 0.5–1.9)

## 2016-07-27 LAB — APTT: aPTT: 30 seconds (ref 24–36)

## 2016-07-27 MED ORDER — PIPERACILLIN-TAZOBACTAM 3.375 G IVPB: 3.3750 g | Freq: Three times a day (TID) | INTRAVENOUS | Status: DC

## 2016-07-27 MED ORDER — BOOST / RESOURCE BREEZE PO LIQD
1.0000 | Freq: Three times a day (TID) | ORAL | Status: DC
  Administered 2016-07-28: 11:00:00 1 via ORAL

## 2016-07-27 MED ORDER — MORPHINE SULFATE (PF) 2 MG/ML IV SOLN: 1.0000 mg | INTRAVENOUS | Status: DC | PRN

## 2016-07-27 MED ORDER — MORPHINE SULFATE (PF) 2 MG/ML IV SOLN: 2.0000 mg | INTRAVENOUS | Status: DC | PRN

## 2016-07-27 MED ORDER — TRAMADOL HCL 50 MG PO TABS
100.0000 mg | ORAL_TABLET | Freq: Four times a day (QID) | ORAL | Status: DC | PRN
  Filled 2016-07-27: qty 2

## 2016-07-27 MED ORDER — METHOCARBAMOL 750 MG PO TABS: 750.0000 mg | ORAL_TABLET | Freq: Four times a day (QID) | ORAL | Status: DC | PRN

## 2016-07-27 MED ORDER — HEPARIN SODIUM (PORCINE) 5000 UNIT/ML IJ SOLN
5000.0000 [IU] | Freq: Three times a day (TID) | INTRAMUSCULAR | Status: DC
  Administered 2016-07-27 – 2016-07-31 (×13): 5000 [IU] via SUBCUTANEOUS
  Filled 2016-07-27 (×13): qty 1

## 2016-07-27 MED ORDER — PIPERACILLIN-TAZOBACTAM 3.375 G IVPB 30 MIN
3.3750 g | Freq: Once | INTRAVENOUS | Status: AC
  Administered 2016-07-27: 3.375 g via INTRAVENOUS
  Filled 2016-07-27: qty 50

## 2016-07-27 MED ORDER — SODIUM CHLORIDE 0.9 % IV SOLN
INTRAVENOUS | Status: DC
  Administered 2016-07-27 – 2016-07-28 (×2): via INTRAVENOUS

## 2016-07-27 MED ORDER — SODIUM CHLORIDE 0.9 % IV SOLN
3.5000 mg | Freq: Once | INTRAVENOUS | Status: AC
  Administered 2016-07-28: 3.5 mg via INTRAVENOUS
  Filled 2016-07-27: qty 4.38

## 2016-07-27 MED ORDER — VANCOMYCIN HCL IN DEXTROSE 1-5 GM/200ML-% IV SOLN
1000.0000 mg | Freq: Once | INTRAVENOUS | Status: AC
  Administered 2016-07-27: 1000 mg via INTRAVENOUS
  Filled 2016-07-27: qty 200

## 2016-07-27 MED ORDER — PANTOPRAZOLE SODIUM 40 MG PO TBEC
40.0000 mg | DELAYED_RELEASE_TABLET | Freq: Every day | ORAL | Status: DC
  Administered 2016-07-29 – 2016-07-31 (×3): 40 mg via ORAL
  Filled 2016-07-27 (×4): qty 1

## 2016-07-27 MED ORDER — IOPAMIDOL (ISOVUE-300) INJECTION 61%
100.0000 mL | Freq: Once | INTRAVENOUS | Status: AC | PRN
  Administered 2016-07-27: 100 mL via INTRAVENOUS

## 2016-07-27 MED ORDER — SODIUM CHLORIDE 0.9 % IV BOLUS (SEPSIS)
2067.0000 mL | Freq: Once | INTRAVENOUS | Status: AC
  Administered 2016-07-27: 2067 mL via INTRAVENOUS

## 2016-07-27 NOTE — H&P (Signed)
[place] [facility] [place] NAME: Francisco Charles    MR#:  [id number]  DATE OF BIRTH:  10/20/1951  DATE OF ADMISSION:  07/27/2016  PRIMARY CARE PHYSICIAN: Lelon Huh, MD   REQUESTING/REFERRING PHYSICIAN: Dr.Gayle  CHIEF COMPLAINT:   Chief Complaint  Patient presents with  . Altered Mental Status    HISTORY OF PRESENT ILLNESS: Francisco Charles  is a 65 y.o. male with a known history of Carpal tunnel syndrome, hyperplastic colonic polyp, hypertension, history oh arthrosis, prostate nodule- was admitted to [facility] with abdominal pain and found to have a pancreatic mass with obstructive jaundice 2 weeks ago- transferred to [facility] for further workup and he had percutaneous biliary drainage tube placed, ERCP done with the biopsies of the mass. Also had a PET scan done- which showed spread of possible pancreatic cancer to liver, mediastinal lymph nodes, bones and in right-sided paracolic region. Patient was sent home one week ago from [facility]. He was able to eat but not enough so was on dietary supplements and able to walk with help and support.  He had gradual worsening in overall condition in last 2-3 days he is remaining completely confused and not having good oral intake so finally family brought him to hospital here. My history Obtained from patient's wife and sister were present in the room at the time of my visit, as per them patient did not had any fever but he had some cold sweats in last few days. His biliary drainage tube continued to drain almost 400 mL of fluid every day. Patient did not had much complain of pain. In ER patient was found to have a rise in his white blood cell count and his hemoglobin also has significant rise with rising creatinine which is suggestive of dehydration. No clear source of infection, but because his presentation is questionable so ER gave her first dose of broad-spectrum antibiotics.  PAST MEDICAL  HISTORY:   Past Medical History:  Diagnosis Date  . Cancer [facility])    pancreatic cancer- Aug 2017  . Carpal tunnel syndrome   . History of measles   . Hyperplastic colonic polyp 2006   Excised Dr. Tiffany Kocher  . Hypertension   . Osteoarthrosis   . Prostate nodule     PAST SURGICAL HISTORY: Past Surgical History:  Procedure Laterality Date  . ERCP N/A 07/16/2016   Procedure: ENDOSCOPIC RETROGRADE CHOLANGIOPANCREATOGRAPHY (ERCP);  Surgeon: Carol Ada, MD;  Location: [facility];  Service: Endoscopy;  Laterality: N/A;  . EUS Left 07/19/2016   Procedure: UPPER ENDOSCOPIC ULTRASOUND (EUS) LINEAR;  Surgeon: Carol Ada, MD;  Location: [facility];  Service: Endoscopy;  Laterality: Left;  . IR GENERIC HISTORICAL  07/17/2016   IR INT EXT BILIARY DRAIN WITH CHOLANGIOGRAM 07/17/2016 Greggory Keen, MD [facility]-INTERV RAD  . JOINT REPLACEMENT Left 10-31-2000  . PROSTATE SURGERY  09/23/2011   Prostate Biopsy Dr. Jacqlyn Larsen  . TOTAL HIP ARTHROPLASTY Left 2001  . TOTAL HIP REVISION Left 07/23/2013   Procedure: LEFT TOTAL HIP REVISION ARTHROPLASTY WITH BONE GRAFT;  Surgeon: Mauri Pole, MD;  Location: [facility] ORS;  Service: Orthopedics;  Laterality: Left;    SOCIAL HISTORY:  Social History  Substance Use Topics  . Smoking status: Current Every Day Smoker    Packs/day: 0.50    Years: 30.00    Types: Cigarettes    Last attempt to quit: 11/29/2012  . Smokeless tobacco: Never Used  . Alcohol use No    FAMILY  HISTORY:  Family History  Problem Relation Age of Onset  . Heart attack Mother   . Cancer Mother   . Heart attack Brother   . Diabetes Neg Hx   . Heart disease Neg Hx     DRUG ALLERGIES: No Known Allergies  REVIEW OF SYSTEMS:   Change in mental status so patient is not able to give me any review of system.  MEDICATIONS AT HOME:  Prior to Admission medications   Medication Sig Start Date End Date Taking? Authorizing Provider  aspirin EC 81 MG tablet Take 81 mg by mouth daily.   Yes Historical  Provider, MD  feeding supplement (BOOST / RESOURCE BREEZE) LIQD Take 1 Container by mouth 3 (three) times daily between meals. 07/15/16  Yes Fritzi Mandes, MD  methocarbamol (ROBAXIN) 750 MG tablet 1-2 TABLETS UP TO 4 TIMES/DAY IF NEEDED FOR MUSCLE PAIN AND SPASM 07/10/16  Yes Historical Provider, MD  omeprazole (PRILOSEC) 20 MG capsule Take 20 mg by mouth 2 (two) times daily.  07/10/16  Yes Historical Provider, MD  quinapril-hydrochlorothiazide (ACCURETIC) 20-12.5 MG tablet TAKE 1 TABLET BY MOUTH DAILY. 05/15/16  Yes Birdie Sons, MD  traMADol (ULTRAM) 50 MG tablet Take 2 tablets (100 mg total) by mouth every 6 (six) hours as needed for moderate pain. 07/20/16  Yes Charlynne Cousins, MD      PHYSICAL EXAMINATION:   VITAL SIGNS: Blood pressure 139/79, pulse 86, temperature 98.7 F (37.1 C), temperature source Rectal, resp. rate 19, height 6' (1.829 m), weight 68.7 kg (151 lb 8 oz), SpO2 98 %.  GENERAL:  65 y.o.-year-old patient lying in the bed with no acute distress. Appears malnourished.  EYES: Pupils equal, round, reactive to light . Present scleral icterus.   HEENT: Head atraumatic, normocephalic. Oropharynx and nasopharynx clear. Mucosa dry. NECK:  Supple, no jugular venous distention. No thyroid enlargement, no tenderness.  LUNGS: Normal breath sounds bilaterally, no wheezing, some crepitation. No use of accessory muscles of respiration.  CARDIOVASCULAR: S1, S2 normal. No murmurs, rubs, or gallops.  ABDOMEN: Soft, mild tender, nondistended. Bowel sounds present. No organomegaly or mass. Right upper quadrant biliary drainage tube present. EXTREMITIES: No pedal edema, cyanosis, or clubbing.  NEUROLOGIC: Patient is lethargic and altered mental status, to stimuli he moves his head and make some moaning sounds but does not open eyes or do any communications, moves his limbs a few inches to stimuli.  PSYCHIATRIC: The patient is lethargic.  SKIN: No obvious rash, lesion, or ulcer.   LABORATORY  PANEL:   CBC  Recent Labs Lab 07/20/16 1636 07/27/16 0754  WBC 13.6* 19.9*  HGB 17.6 18.1*  HCT 51.2 53.6*  PLT 292 301  MCV 88.4 88.9  MCH 30.3 30.0  MCHC 34.3 33.7  RDW 14.4 15.0*  LYMPHSABS 0.9* 0.9*  MONOABS 1.0 1.3*  EOSABS 0.1 0.0  BASOSABS 0.0 0.1   ------------------------------------------------------------------------------------------------------------------  Chemistries   Recent Labs Lab 07/20/16 1636 07/27/16 0754  NA 132* 132*  K 3.8 4.2  CL 93* 90*  CO2 24 28  GLUCOSE 127* 167*  BUN 16 44*  CREATININE 0.83 1.38*  CALCIUM 10.8* 12.4*  AST 75* 90*  ALT 150* 171*  ALKPHOS 752* 704*  BILITOT 4.3* 4.7*   ------------------------------------------------------------------------------------------------------------------ estimated creatinine clearance is 53.2 mL/min (by C-G formula based on SCr of 1.38 mg/dL). ------------------------------------------------------------------------------------------------------------------ No results for input(s): TSH, T4TOTAL, T3FREE, THYROIDAB in the last 72 hours.  Invalid input(s): FREET3   Coagulation profile  Recent Labs Lab 07/27/16 0754  INR 1.32   ------------------------------------------------------------------------------------------------------------------- No results for input(s): DDIMER in the last 72 hours. -------------------------------------------------------------------------------------------------------------------  Cardiac Enzymes  Recent Labs Lab 07/27/16 0754  TROPONINI 0.11*   ------------------------------------------------------------------------------------------------------------------ Invalid input(s): POCBNP  ---------------------------------------------------------------------------------------------------------------  Urinalysis    Component Value Date/Time   COLORURINE AMBER (A) 07/27/2016 0747   APPEARANCEUR HAZY (A) 07/27/2016 0747   LABSPEC 1.019 07/27/2016 0747    PHURINE 5.0 07/27/2016 0747   GLUCOSEU 50 (A) 07/27/2016 0747   HGBUR NEGATIVE 07/27/2016 0747   BILIRUBINUR NEGATIVE 07/27/2016 0747   KETONESUR NEGATIVE 07/27/2016 0747   PROTEINUR NEGATIVE 07/27/2016 0747   UROBILINOGEN 1.0 07/17/2013 1458   NITRITE NEGATIVE 07/27/2016 0747   LEUKOCYTESUR NEGATIVE 07/27/2016 0747     RADIOLOGY: Ct Abdomen Pelvis W Contrast  Result Date: 07/27/2016 CLINICAL DATA:  Sepsis ; history sepsis, essential hypertension, prostate cancer, pancreatic mass of elevated CA 19 9 EXAM: CT ABDOMEN AND PELVIS WITH CONTRAST TECHNIQUE: Multidetector CT imaging of the abdomen and pelvis was performed using the standard protocol following bolus administration of intravenous contrast. Sagittal and coronal MPR images reconstructed from axial data set. CONTRAST:  146m ISOVUE-300 IOPAMIDOL (ISOVUE-300) INJECTION 61% IV. Dilute oral contrast. COMPARISON:  PET-CT 07/22/2016 FINDINGS: Lower chest:  Atelectasis and volume loss in BILATERAL lower lobes. Hepatobiliary: PTC drain traverses liver and CBD into duodenum. Mild intrahepatic biliary dilatation. Gallbladder contracted. 8 x 6 mm low-attenuation RIGHT lobe liver nodule again seen. Pancreas: Minimal prominence of pancreatic head though discrete mass is not visualized. This corresponds with the area of hypermetabolism on prior PET-CT. No definite pancreatic ductal dilatation or calcification. Spleen: Normal appearance Adrenals/Urinary Tract: Mild diffuse thickening of LEFT adrenal gland with question nodule medial limb 14 x 13 mm diameter image 34. RIGHT adrenal gland unremarkable. Small BILATERAL renal cysts without additional renal mass. No hydronephrosis, ureteral calcification or ureteral dilatation. Bladder unremarkable. Prostate gland/bed limited assessment due to beam hardening artifacts from LEFT hip prosthesis. Stomach/Bowel: Appendix filled with contrast. Few scattered distal colonic diverticula pre Large and small bowel loops  otherwise unremarkable. Stomach inadequately distended, unable to exclude areas of wall thickening. Vascular/Lymphatic: Scattered atherosclerotic calcifications. Aorta normal caliber. Peripancreatic nodes identified on prior PET-CT are less well visualized, LN 11 mm aortocaval node image 31 is noted, hypermetabolic on prior CT. Reproductive: N/A Other: Soft tissue nodule enhancing margin at LEFT pericolic gutter 20 x 15 mm image 51, suspect peritoneal metastasis, was hypermetabolic on PET-CT. Question mild infiltration of transverse mesocolon see image 39 and small mesenteric. No free air or free fluid. Small umbilical hernia containing fat. Musculoskeletal: Numerous sclerotic foci throughout osseous structures compatible with widespread osseous metastatic disease. In addition there are areas of lytic lucency suspicious for lytic metastases within ribs and vertebra. LEFT hip prosthesis. IMPRESSION: Pancreatic head mass post PTC with minimal residual intrahepatic biliary dilatation. Probable peripancreatic nodal and LEFT paracolic gutter peritoneal metastases. Osseous metastases. Bibasilar atelectasis without definite lung base metastasis. Mild distal colonic diverticulosis. Small umbilical hernia. Probable LEFT adrenal nodule 14 x 13 mm, indeterminate. Question mild infiltrative changes within transverse mesocolon and small bowel mesenteric. Electronically Signed   By: MLavonia DanaM.D.   On: 07/27/2016 09:48   Dg Chest Port 1 View  Result Date: 07/27/2016 CLINICAL DATA:  Altered mental status, sepsis, hypertension, pancreatic lesion, smoking history EXAM: PORTABLE CHEST 1 VIEW COMPARISON:  PET-CT of 07/22/2016, and chest x-ray of 07/17/2013 FINDINGS: There is linear atelectasis or scarring at the right lung base. No infiltrate or effusion is seen. Mediastinal and hilar  contours are unremarkable. The heart is within normal limits in size. A lytic lesion is noted involving the right posterolateral sixth rib. Also  the PET-CT demonstrates multiple bony metastases. IMPRESSION: 1. Linear atelectasis or scarring at the right lung base. No active infiltrate or effusion. 2. Lytic lesion involving the right posterolateral sixth rib. Electronically Signed   By: Ivar Drape M.D.   On: 07/27/2016 08:17    EKG: Orders placed or performed during the hospital encounter of 07/27/16  . ED EKG 12-Lead  . ED EKG 12-Lead    IMPRESSION AND PLAN:  * Altered mental status   Possibly due to dehydration, metastatic cancer and elevated bilirubin.   Suspected sepsis by ER but I doubt.   No clear source of infection. ER gave her 1 dose of broad-spectrum antibiotics and sent for blood and urine cultures.   I would like to hold on antibiotics at this time and wait for the cultures or if he starts having any fever.   We will give IV fluids and wait for improvements.  * Acute renal insufficiency   He has worsening in the renal function which is suggestive of dehydration.   Also evident by rise in his hemoglobin level in last 1 week.   IV fluids and monitor.  * Metastatic pancreatic cancer   This is a new diagnosis for the patient, workup was done last week at [facility] and he was supposed to have appointment in [place] with Dr. Grayland Ormond this week.   I will call oncology consult to give IV about prognosis and treatment options at this point to his family to help them decide goals of treatment.    I will also call palliative care consult to help with this decision making.  * Malnutrition   Once he has improvement in mental status we can start feeding him regular food with nutritional supplements.  * Hypertension   Hold medications at this point because of his severe dehydration.  All the records are reviewed and case discussed with ED provider. Management plans discussed with the patient, family and they are in agreement.  CODE STATUS: DO NOT RESUSCITATE Code Status History    Date Active Date Inactive Code  Status Order ID Comments User Context   07/15/2016 12:27 PM 07/20/2016  1:18 PM Full Code [id number]  Charlynne Cousins, MD Inpatient   07/13/2016  9:45 PM 07/15/2016 11:35 AM Full Code [id number]  Gladstone Lighter, MD ED   07/23/2013 11:34 AM 07/24/2013  2:18 PM Full Code [id number]  Pricilla Loveless, PA-C Inpatient     Patient is lethargic and I discussed the plan with his wife, sister, brother-in-law who are present in the room they understand and agree and they made it clear to me about the CODE STATUS.  TOTAL TIME TAKING CARE OF THIS PATIENT: 50 minutes.    Vaughan Basta M.D on 07/27/2016   Between 7am to 6pm - Pager - [phone]  After 6pm go to [email or web address] - password [email or web address] [facility]  Office  [phone]  CC: Primary care physician; Lelon Huh, MD   Note: This dictation was prepared with Dragon dictation along with smaller phrase technology. Any transcriptional errors that result from this process are unintentional.

## 2016-07-27 NOTE — Consult Note (Signed)
Consultation Note Date: 07/27/2016   Patient Name: Francisco Charles  DOB: [birth year]  MRN: [id number]  Age / Sex: 65 y.o., male  PCP: Birdie Sons, MD Referring Physician: Vaughan Basta, MD  Reason for Consultation: Establishing goals of care and Psychosocial/spiritual support  HPI/Patient Profile: 65 y.o. male   admitted on 07/27/2016  admitted to [facility] with abdominal pain and found to have a pancreatic mass with obstructive jaundice 2 weeks ago.  Dad percutaneous biliary drainage tube placed at [facility] , ERCP done with the biopsies of the mass.   Also had a PET scan done- which showed spread of possible pancreatic cancer to liver, mediastinal lymph nodes, bones and in right-sided paracolic region.   Patient was sent home one week ago from [facility]. He was able to eat but not enough so was on dietary supplements and able to walk with help and support.   He had gradual worsening in overall condition in last 2-3 days he is remaining completely confused and not having good oral intake so finally family brought him to hospital here.   Currently in the ER, wife at bedside.  Patient is minimally responsive  In ER patient was found to have a rise in his white blood cell count and his hemoglobin also has significant rise with rising creatinine which is suggestive of dehydration.   No clear source of infection, but because his presentation is questionable so ER gave her first dose of broad-spectrum antibiotics.  Family faced with advanced care decisions and anticipatory care needs  Clinical Assessment and Goals of Care:   This NP Wadie Lessen reviewed medical records, received report from team, assessed the patient and then meet at the patient's bedside along with his wife  to discuss diagnosis, prognosis, GOC, EOL wishes disposition and options.  A discussion was had today  regarding advanced directives.  Concepts specific to code status, artifical feeding and hydration, continued IV antibiotics and rehospitalization was had.  The difference between a aggressive medical intervention path  and a palliative comfort care path for this patient at this time was had.  Values and goals of care important to patient and family were attempted to be elicited.   Concept of Hospice and Palliative Care were discussed  Natural trajectory and expectations at EOL were discussed.  Questions and concerns addressed.   Family encouraged to call with questions or concerns.  PMT will continue to support holistically.   SUMMARY OF RECOMMENDATIONS    Code Status/Advance Care Planning:  DNR-documented today    Symptom Management:   Pain: Morphine 2 mg IV every 2 hrs prn  Palliative Prophylaxis:   Aspiration, Bowel Regimen, Delirium Protocol, Frequent Pain Assessment and Oral Care  Additional Recommendations (Limitations, Scope, Preferences):  Await feed back form Dr Marcelyn Ditty before making decision regarding a shift to full comfort and disposition to hospice facilty  Wife is very familiar with facility in [place], her mother died there.  Psycho-social/Spiritual:   Desire for further Chaplaincy support:no  Additional Recommendations:  Education on Hospice  Prognosis:   < 2 weeks, depending on desire for life prolonging measures  Discharge Planning: To Be Determined      Primary Diagnoses: Present on Admission: **None**   I have reviewed the medical record, interviewed the patient and family, and examined the patient. The following aspects are pertinent.  Past Medical History:  Diagnosis Date  . Cancer [facility])    pancreatic cancer- Aug 2017  . Carpal tunnel syndrome   . History of measles   . Hyperplastic colonic polyp 2006   Excised Dr. Tiffany Kocher  . Hypertension   . Osteoarthrosis   . Prostate nodule    Social History   Social History  . Marital  status: Married    Spouse name: N/A  . Number of children: 1  . Years of education: N/A   Occupational History  . Employed     Works full time with [employer] Topics  . Smoking status: Current Every Day Smoker    Packs/day: 0.50    Years: 30.00    Types: Cigarettes    Last attempt to quit: 11/29/2012  . Smokeless tobacco: Never Used  . Alcohol use No  . Drug use: No  . Sexual activity: Yes   Other Topics Concern  . None   Social History Narrative  . None   Family History  Problem Relation Age of Onset  . Heart attack Mother   . Cancer Mother   . Heart attack Brother   . Diabetes Neg Hx   . Heart disease Neg Hx    Scheduled Meds:  Continuous Infusions: . sodium chloride     PRN Meds:.morphine injection Medications Prior to Admission:  Prior to Admission medications   Medication Sig Start Date End Date Taking? Authorizing Provider  aspirin EC 81 MG tablet Take 81 mg by mouth daily.   Yes Historical Provider, MD  feeding supplement (BOOST / RESOURCE BREEZE) LIQD Take 1 Container by mouth 3 (three) times daily between meals. 07/15/16  Yes Fritzi Mandes, MD  methocarbamol (ROBAXIN) 750 MG tablet 1-2 TABLETS UP TO 4 TIMES/DAY IF NEEDED FOR MUSCLE PAIN AND SPASM 07/10/16  Yes Historical Provider, MD  omeprazole (PRILOSEC) 20 MG capsule Take 20 mg by mouth 2 (two) times daily.  07/10/16  Yes Historical Provider, MD  quinapril-hydrochlorothiazide (ACCURETIC) 20-12.5 MG tablet TAKE 1 TABLET BY MOUTH DAILY. 05/15/16  Yes Birdie Sons, MD  traMADol (ULTRAM) 50 MG tablet Take 2 tablets (100 mg total) by mouth every 6 (six) hours as needed for moderate pain. 07/20/16  Yes Charlynne Cousins, MD   No Known Allergies Review of Systems  Unable to perform ROS: Mental status change    Physical Exam  Constitutional: He appears cachectic. He appears ill. Nasal cannula in place.  -minimally responsive  Cardiovascular: Normal rate, regular rhythm  and normal heart sounds.   Pulmonary/Chest: He has decreased breath sounds.  Skin: Skin is warm and dry.    Vital Signs: BP 128/87   Pulse 86   Temp 98.7 F (37.1 C) (Rectal)   Resp (!) 24   Ht 6' (1.829 m)   Wt 68.7 kg (151 lb 8 oz)   SpO2 99%   BMI 20.55 kg/m          SpO2: SpO2: 99 % O2 Device:SpO2: 99 % O2 Flow Rate: .O2 Flow Rate (L/min): 4 L/min  IO: Intake/output summary:  Intake/Output Summary (Last 24 hours) at 07/27/16 1149  Last data filed at 07/27/16 1023  Gross per 24 hour  Intake             2370 ml  Output              150 ml  Net             2220 ml    LBM:   Baseline Weight: Weight: 68.7 kg (151 lb 8 oz) Most recent weight: Weight: 68.7 kg (151 lb 8 oz)      Palliative Assessment/Data: 30 % at best   Dr Anselm Jungling  Time In: 1130 Time Out: 1245 Time Total: 75  min Greater than 50%  of this time was spent counseling and coordinating care related to the above assessment and plan.  Signed by: Wadie Lessen, NP   Please contact Palliative Medicine Team phone at [phone] for questions and concerns.  For individual provider: See Shea Evans

## 2016-07-27 NOTE — ED Provider Notes (Signed)
[facility] Emergency Department Provider Note   ____________________________________________   First MD Initiated Contact with Patient 07/27/16 [phone]     (approximate)  I have reviewed the triage vital signs and the nursing notes.   HISTORY  Chief Complaint Altered Mental Status  Caveat-history of present illness and review of systems is limited due to the patient's severely altered mental status. All information is obtained from EMS on arrival.  HPI Francisco Charles is a 65 y.o. male with history of hypertension, arthritis, Pancreatic mass status post percutaneous biliary drain placed on 07/18/2016 who presents for evaluation of generalized weakness and altered mental status. Wife apparently called EMS because the patient has been increasingly weak over the past 2 days. Per EMS, his vital signs were stable, no other history is available.   Past Medical History:  Diagnosis Date  . Carpal tunnel syndrome   . History of measles   . Hyperplastic colonic polyp 2006   Excised Dr. Tiffany Kocher  . Hypertension   . Osteoarthrosis   . Prostate nodule     Patient Active Problem List   Diagnosis Date Noted  . Obstructive jaundice 07/16/2016  . Protein-calorie malnutrition, severe 07/14/2016  . Abnormal LFTs (liver function tests) 07/13/2016  . Pancreatic mass 07/13/2016  . Hematuria 07/12/2016  . Dysphagia 07/12/2016  . 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 Rosacea 07/25/2015  . Pre-diabetes 07/17/2015  . Essential hypertension 07/17/2015  . Overweight (BMI 25.0-29.9) 07/24/2013  . S/P left TH revision 07/23/2013  . History of tobacco use 04/18/2008  . Carpal tunnel syndrome 03/07/2008  . 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 ERCP N/A 07/16/2016   Procedure: ENDOSCOPIC RETROGRADE CHOLANGIOPANCREATOGRAPHY (ERCP);   Surgeon: Carol Ada, MD;  Location: [facility];  Service: Endoscopy;  Laterality: N/A;  . EUS Left 07/19/2016   Procedure: UPPER ENDOSCOPIC ULTRASOUND (EUS) LINEAR;  Surgeon: Carol Ada, MD;  Location: [facility];  Service: Endoscopy;  Laterality: Left;  . IR GENERIC HISTORICAL  07/17/2016   IR INT EXT BILIARY DRAIN WITH CHOLANGIOGRAM 07/17/2016 Greggory Keen, MD [facility]  . JOINT REPLACEMENT Left 10-31-2000  . PROSTATE SURGERY  09/23/2011   Prostate Biopsy Dr. Jacqlyn Larsen  . TOTAL HIP ARTHROPLASTY Left 2001  . TOTAL HIP REVISION Left 07/23/2013   Procedure: LEFT TOTAL HIP REVISION ARTHROPLASTY WITH BONE GRAFT;  Surgeon: Mauri Pole, MD;  Location: [facility];  Service: Orthopedics;  Laterality: Left;    Prior to Admission medications   Medication Sig Start Date End Date Taking? Authorizing Provider  aspirin EC 81 MG tablet Take 81 mg by mouth daily.    Historical Provider, MD  feeding supplement (BOOST / RESOURCE BREEZE) LIQD Take 1 Container by mouth 3 (three) times daily between meals. 07/15/16   Fritzi Mandes, MD  methocarbamol (ROBAXIN) 750 MG tablet 1-2 TABLETS UP TO 4 TIMES/DAY IF NEEDED FOR MUSCLE PAIN AND SPASM 07/10/16   Historical Provider, MD  omeprazole (PRILOSEC) 20 MG capsule Take 20 mg by mouth daily.  07/10/16   Historical Provider, MD  quinapril-hydrochlorothiazide (ACCURETIC) 20-12.5 MG tablet TAKE 1 TABLET BY MOUTH DAILY. 05/15/16   Birdie Sons, MD  traMADol (ULTRAM) 50 MG tablet Take 2 tablets (100 mg total) by mouth every 6 (six) hours as needed for moderate pain. 07/20/16   Charlynne Cousins, MD    Allergies Review of patient's allergies indicates  no known allergies.  Family History  Problem Relation Age of Onset  . Heart attack Mother   . Cancer Mother   . Heart attack Brother   . Diabetes Neg Hx   . Heart disease Neg Hx     Social History Social History  Substance Use Topics  . Smoking status: Current Every Day Smoker    Packs/day: 0.50    Years: 30.00      Types: Cigarettes    Last attempt to quit: 11/29/2012  . Smokeless tobacco: Never Used  . Alcohol use No    Review of Systems   Caveat-history of present illness and review of systems is limited due to the patient's severely altered mental status. All information is obtained from EMS on arrival. ____________________________________________   PHYSICAL EXAM:  VITAL SIGNS: ED Triage Vitals  Enc Vitals Group     BP 07/27/16 0745 (!) 146/90     Pulse Rate 07/27/16 0745 (!) 102     Resp 07/27/16 0745 (!) 22     Temp 07/27/16 0745 98.7 F (37.1 C)     Temp Source 07/27/16 0745 Rectal     SpO2 07/27/16 0745 99 %     Weight 07/27/16 0804 151 lb 8 oz (68.7 kg)     Height 07/27/16 0804 6' (1.829 m)     Head Circumference --      Peak Flow --      Pain Score --      Pain Loc --      Pain Edu? --      Excl. in [place]? --     Constitutional: Awake, opens eyes to sternal rub but does not verbalize, moves all extremities spontaneously but does not cooperate with formal neurological testing, does not  follow commands. Ill-appearing. Eyes: Conjunctivae are normal. PERRL. EOMI. Head: Atraumatic. Nose: No congestion/rhinnorhea. Mouth/Throat: Mucous membranes are dry.  Oropharynx non-erythematous. Neck: No stridor. Appears supple without meningismus. Cardiovascular: tachycardic  rate, regular rhythm. Grossly normal heart sounds.  Good peripheral circulation. Respiratory: Normal respiratory effort with mild tachypnea.  No retractions. Lungs CTAB. Gastrointestinal: Soft and nontender. No distention. Biliary drain in the right upper quadrant, mild erythema at the insertion site. No CVA tenderness. Genitourinary: Deferred Musculoskeletal: No lower extremity tenderness nor edema.  No joint effusions. Neurologic:  As above, eyes open to sternal rub, patient does not verbalize, moves all extremities spontaneously and equally. Skin:  Skin is warm, dry and intact. No rash noted. Psychiatric: Mood and  affect are normal. Speech and behavior are normal.  ____________________________________________   LABS (all labs ordered are listed, but only abnormal results are displayed)  Labs Reviewed  COMPREHENSIVE METABOLIC PANEL - Abnormal; Notable for the following:       Result Value   Sodium 132 (*)    Chloride 90 (*)    Glucose, Bld 167 (*)    BUN 44 (*)    Creatinine, Ser 1.38 (*)    Calcium 12.4 (*)    AST 90 (*)    ALT 171 (*)    Alkaline Phosphatase 704 (*)    Total Bilirubin 4.7 (*)    GFR calc non Af Amer 53 (*)    All other components within normal limits  TROPONIN I - Abnormal; Notable for the following:    Troponin I 0.11 (*)    All other components within normal limits  CBC WITH DIFFERENTIAL/PLATELET - Abnormal; Notable for the following:    WBC 19.9 (*)    RBC  6.02 (*)    Hemoglobin 18.1 (*)    HCT 53.6 (*)    RDW 15.0 (*)    Neutro Abs 17.6 (*)    Lymphs Abs 0.9 (*)    Monocytes Absolute 1.3 (*)    All other components within normal limits  PROTIME-INR - Abnormal; Notable for the following:    Prothrombin Time 16.5 (*)    All other components within normal limits  URINALYSIS COMPLETEWITH MICROSCOPIC ([facility] ONLY) - Abnormal; Notable for the following:    Color, Urine AMBER (*)    APPearance HAZY (*)    Glucose, UA 50 (*)    Bacteria, UA RARE (*)    Squamous Epithelial / LPF 0-5 (*)    All other components within normal limits  CULTURE, BLOOD (ROUTINE X 2)  CULTURE, BLOOD (ROUTINE X 2)  URINE CULTURE  LACTIC ACID, PLASMA  APTT  LIPASE, BLOOD  AMMONIA  LACTIC ACID, PLASMA  TYPE AND SCREEN   ____________________________________________  EKG  ED ECG REPORT I, Joanne Gavel, the attending physician, personally viewed and interpreted this ECG.   Date: 07/27/2016  EKG Time: 07:43  Rate: 106  Rhythm: sinus tachycardia  Axis: normal  Intervals:none  ST&T Change: No acute ST elevation or acute ST  depression.  ____________________________________________  RADIOLOGY  CXR IMPRESSION:  1. Linear atelectasis or scarring at the right lung base. No active  infiltrate or effusion.  2. Lytic lesion involving the right posterolateral sixth rib.        CT abdomen and pelvis IMPRESSION:  Pancreatic head mass post PTC with minimal residual intrahepatic  biliary dilatation. 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    Osseous metastases.    Bibasilar atelectasis without definite lung base metastasis.    Mild distal colonic diverticulosis.    Small umbilical hernia.    Probable LEFT adrenal nodule 14 x 13 mm, indeterminate.    Question mild infiltrative changes within transverse mesocolon and  small bowel mesenteric.    ____________________________________________   PROCEDURES  Procedure(s) performed: None  Procedures  Critical Care performed: Yes, see critical care note(s)   CRITICAL CARE Performed by: Loura Pardon A   Total critical care time: 35 minutes  Critical care time was exclusive of separately billable procedures and treating other patients.  Critical care was necessary to treat or prevent imminent or life-threatening deterioration.  Critical care was time spent personally by me on the following activities: development of treatment plan with patient and/or surrogate as well as nursing, discussions with consultants, evaluation of patient's response to treatment, examination of patient, obtaining history from patient or surrogate, ordering and performing treatments and interventions, ordering and review of laboratory studies, ordering and review of radiographic studies, pulse oximetry and re-evaluation of patient's condition.  ____________________________________________   INITIAL IMPRESSION / ASSESSMENT AND PLAN / ED COURSE  Pertinent labs & imaging results that were available during my care of the patient  were reviewed by me and considered in my medical decision making (see chart for details).  Francisco Charles is a 65 y.o. male with history of hypertension, arthritis, Pancreatic mass status post percutaneous biliary drain placed on 07/18/2016 who presents for evaluation of generalized weakness and altered mental status. On arrival to the emergency department he is altered, does not verbalize, opens eyes to sternal rub, does not follow commands but moves his extremities equally. He is mildly tachycardic and tachypneic and given his altered mental status, could sepsis initiated, we will provide  aggressive IV fluid resuscitation, empiric antibiotics with IV vancomycin and Zosyn. We'll obtain screening labs, chest x-ray, urinalysis and anticipate admission.  ----------------------------------------- 10:03 AM on 07/27/2016 ----------------------------------------- Reassessed the patient, he now awakens to voice and is able to answer simple questions, he denies any pain complaints, he is now able to follow commands. Tachycardia has resolved, blood pressure stable at 139/79. I viewed the patient's labs which show leukocytosis, white blood cell count is elevated at 19.9. Hemoglobin also elevated at 18.1 which I suspect is due to dehydration/hemoconcentration. Lactic acid is reassuring. CMP shows mild creatinine elevation of 1.38 as well as chronic LFT elevation which are similar to prior. Normal lipase. Troponin is elevated 0.11, question NSTEMI versus demand ischemia. Chest x-ray shows no acute cardiopulmonary disease. CT of the abdomen and pelvis shows no complication associated with the biliary drain, findings expected in the setting of his known pancreatic cancer, urinalysis not consistent with infection. Case discussed with hospitalist for admission at this time.   Clinical Course     ____________________________________________   FINAL CLINICAL IMPRESSION(S) / ED DIAGNOSES  Final diagnoses:   Altered mental status, unspecified altered mental status type  Sepsis, due to unspecified organism ([place])  Troponin level elevated      NEW MEDICATIONS STARTED DURING THIS VISIT:  New Prescriptions   No medications on file     Note:  This document was prepared using Dragon voice recognition software and may include unintentional dictation errors.    Joanne Gavel, MD 07/27/16 1013

## 2016-07-27 NOTE — ED Triage Notes (Signed)
Arrives from home via [facility].  Wife called 911 due to AMS and increased weakness this morning.  States patient was weaker than normal yesterday but this morning symptoms were worse.  Patient was recently in [place] and had a pancreatic biopsy and a biliary tube placed.

## 2016-07-27 NOTE — Consult Note (Signed)
[place]  [phone] [phone]) [phone]  ID: Francisco Charles OB: Apr 24, 1951  MR#: [id number]  UMP#:[id number]  Patient Care Team: Birdie Sons, MD as PCP - General (Family Medicine) Murrell Redden, MD as Consulting Physician (Urology) Manya Silvas, MD (Gastroenterology) Clent Jacks, RN as Registered Nurse  CHIEF COMPLAINT: Altered mental status, likely stage IV pancreatic cancer, hypercalcemia  INTERVAL HISTORY: Patient is a 65 year old male with high suspicion of stage IV pancreatic cancer.  Biopsies have been negative to date.  He presented to the emergency room with increasing weakness and fatigue and altered mental status. Patient remains altered and the entire history is given by is wife. He has a poor appetite with minimal PO intake over the past week. She does not report any fevers.  He continues to have abdominal pain.    REVIEW OF SYSTEMS:   Review of Systems  Unable to perform ROS: Mental status change    As per HPI. Otherwise, a complete review of systems is negatve.  PAST MEDICAL HISTORY: Past Medical History:  Diagnosis Date  . Cancer [facility])    pancreatic cancer- Aug 2017  . Carpal tunnel syndrome   . History of measles   . Hyperplastic colonic polyp 2006   Excised Dr. Tiffany Kocher  . Hypertension   . Osteoarthrosis   . Prostate nodule     PAST SURGICAL HISTORY: Past Surgical History:  Procedure Laterality Date  . ERCP N/A 07/16/2016   Procedure: ENDOSCOPIC RETROGRADE CHOLANGIOPANCREATOGRAPHY (ERCP);  Surgeon: Carol Ada, MD;  Location: [facility];  Service: Endoscopy;  Laterality: N/A;  . EUS Left 07/19/2016   Procedure: UPPER ENDOSCOPIC ULTRASOUND (EUS) LINEAR;  Surgeon: Carol Ada, MD;  Location: [facility] ENDOSCOPY;  Service: Endoscopy;  Laterality: Left;  . IR GENERIC HISTORICAL  07/17/2016   IR INT EXT BILIARY DRAIN WITH CHOLANGIOGRAM 07/17/2016 Greggory Keen, MD [facility]-INTERV RAD  . JOINT REPLACEMENT Left 10-31-2000  .  PROSTATE SURGERY  09/23/2011   Prostate Biopsy Dr. Jacqlyn Larsen  . TOTAL HIP ARTHROPLASTY Left 2001  . TOTAL HIP REVISION Left 07/23/2013   Procedure: LEFT TOTAL HIP REVISION ARTHROPLASTY WITH BONE GRAFT;  Surgeon: Mauri Pole, MD;  Location: [facility] ORS;  Service: Orthopedics;  Laterality: Left;    FAMILY HISTORY: Family History  Problem Relation Age of Onset  . Heart attack Mother   . Cancer Mother   . Heart attack Brother   . Diabetes Neg Hx   . Heart disease Neg Hx        ADVANCED DIRECTIVES (Y/N):  @ADVDIR @   HEALTH MAINTENANCE: Social History  Substance Use Topics  . Smoking status: Current Every Day Smoker    Packs/day: 0.50    Years: 30.00    Types: Cigarettes    Last attempt to quit: 11/29/2012  . Smokeless tobacco: Never Used  . Alcohol use No     Colonoscopy:  PAP:  Bone density:  Lipid panel:  No Known Allergies  Current Facility-Administered Medications  Medication Dose Route Frequency Provider Last Rate Last Dose  . 0.9 %  sodium chloride infusion   Intravenous Continuous Vaughan Basta, MD 75 mL/hr at 07/27/16 1304    . feeding supplement (BOOST / RESOURCE BREEZE) liquid 1 Container  1 Container Oral TID BM Vaughan Basta, MD      . heparin injection 5,000 Units  5,000 Units Subcutaneous Q8H Vaughan Basta, MD   5,000 Units at 07/27/16 2116  . methocarbamol (ROBAXIN) tablet 750 mg  750 mg  Oral Q6H PRN Vaughan Basta, MD      . morphine 2 MG/ML injection 2 mg  2 mg Intravenous Q2H PRN Knox Royalty, NP      . pantoprazole (PROTONIX) EC tablet 40 mg  40 mg Oral Daily Vaughan Basta, MD      . traMADol Veatrice Bourbon) tablet 100 mg  100 mg Oral Q6H PRN Vaughan Basta, MD        OBJECTIVE: Vitals:   07/27/16 1306 07/27/16 2053  BP: (!) 144/82 135/77  Pulse: 83 86  Resp: 20 16  Temp: 98.4 F (36.9 C) 97.4 F (36.3 C)     Body mass index is 20.55 kg/m.    ECOG FS:4 - Bedbound  General: Ill-appearing, no acute  distress. Eyes: Pink conjunctiva, anicteric sclera. Lungs: Clear to auscultation bilaterally. Heart: Regular rate and rhythm. No rubs, murmurs, or gallops. Abdomen: Soft, nontender, nondistended. Percutaneous bile duct tube noted.. Musculoskeletal: No edema, cyanosis, or clubbing. Neuro: Lethargic. Skin: No rashes or petechiae noted.   LAB RESULTS:  Lab Results  Component Value Date   NA 132 (L) 07/27/2016   K 4.2 07/27/2016   CL 90 (L) 07/27/2016   CO2 28 07/27/2016   GLUCOSE 167 (H) 07/27/2016   BUN 44 (H) 07/27/2016   CREATININE 1.38 (H) 07/27/2016   CALCIUM 12.4 (H) 07/27/2016   PROT 8.0 07/27/2016   ALBUMIN 3.6 07/27/2016   AST 90 (H) 07/27/2016   ALT 171 (H) 07/27/2016   ALKPHOS 704 (H) 07/27/2016   BILITOT 4.7 (H) 07/27/2016   GFRNONAA 53 (L) 07/27/2016   GFRAA >60 07/27/2016    Lab Results  Component Value Date   WBC 19.9 (H) 07/27/2016   NEUTROABS 17.6 (H) 07/27/2016   HGB 18.1 (H) 07/27/2016   HCT 53.6 (H) 07/27/2016   MCV 88.9 07/27/2016   PLT 301 07/27/2016     STUDIES: Ct Abdomen Pelvis W Contrast  Result Date: 07/27/2016 CLINICAL DATA:  Sepsis ; history sepsis, essential hypertension, prostate cancer, pancreatic mass of elevated CA 19 9 EXAM: CT ABDOMEN AND PELVIS WITH CONTRAST TECHNIQUE: Multidetector CT imaging of the abdomen and pelvis was performed using the standard protocol following bolus administration of intravenous contrast. Sagittal and coronal MPR images reconstructed from axial data set. CONTRAST:  160m ISOVUE-300 IOPAMIDOL (ISOVUE-300) INJECTION 61% IV. Dilute oral contrast. COMPARISON:  PET-CT 07/22/2016 FINDINGS: Lower chest:  Atelectasis and volume loss in BILATERAL lower lobes. Hepatobiliary: PTC drain traverses liver and CBD into duodenum. Mild intrahepatic biliary dilatation. Gallbladder contracted. 8 x 6 mm low-attenuation RIGHT lobe liver nodule again seen. Pancreas: Minimal prominence of pancreatic head though discrete mass is not  visualized. This corresponds with the area of hypermetabolism on prior PET-CT. No definite pancreatic ductal dilatation or calcification. Spleen: Normal appearance Adrenals/Urinary Tract: Mild diffuse thickening of LEFT adrenal gland with question nodule medial limb 14 x 13 mm diameter image 34. RIGHT adrenal gland unremarkable. Small BILATERAL renal cysts without additional renal mass. No hydronephrosis, ureteral calcification or ureteral dilatation. Bladder unremarkable. Prostate gland/bed limited assessment due to beam hardening artifacts from LEFT hip prosthesis. Stomach/Bowel: Appendix filled with contrast. Few scattered distal colonic diverticula pre Large and small bowel loops otherwise unremarkable. Stomach inadequately distended, unable to exclude areas of wall thickening. Vascular/Lymphatic: Scattered atherosclerotic calcifications. Aorta normal caliber. Peripancreatic nodes identified on prior PET-CT are less well visualized, LN 11 mm aortocaval node image 31 is noted, hypermetabolic on prior CT. Reproductive: N/A Other: Soft tissue nodule enhancing margin at LEFT pericolic gutter  20 x 15 mm image 51, suspect peritoneal metastasis, was hypermetabolic on PET-CT. Question mild infiltration of transverse mesocolon see image 39 and small mesenteric. No free air or free fluid. Small umbilical hernia containing fat. Musculoskeletal: Numerous sclerotic foci throughout osseous structures compatible with widespread osseous metastatic disease. In addition there are areas of lytic lucency suspicious for lytic metastases within ribs and vertebra. LEFT hip prosthesis. IMPRESSION: Pancreatic head mass post PTC with minimal residual intrahepatic biliary dilatation. Probable peripancreatic nodal and LEFT paracolic gutter peritoneal metastases. Osseous metastases. Bibasilar atelectasis without definite lung base metastasis. Mild distal colonic diverticulosis. Small umbilical hernia. Probable LEFT adrenal nodule 14 x 13  mm, indeterminate. Question mild infiltrative changes within transverse mesocolon and small bowel mesenteric. Electronically Signed   By: Lavonia Dana M.D.   On: 07/27/2016 09:48   Ct Abdomen Pelvis W Contrast  Result Date: 07/13/2016 CLINICAL DATA:  65 year old male with right abdominal pain, most prominent in the right lower quadrant. Elevated liver function tests. Postprandial nausea. History of prostate biopsy. EXAM: CT ABDOMEN AND PELVIS WITH CONTRAST TECHNIQUE: Multidetector CT imaging of the abdomen and pelvis was performed using the standard protocol following bolus administration of intravenous contrast. CONTRAST:  158m ISOVUE-300 IOPAMIDOL (ISOVUE-300) INJECTION 61% COMPARISON:  09/29/2012 bone protocol CT pelvis. No prior CT abdomen/pelvis. FINDINGS: Lower chest: No significant pulmonary nodules or acute consolidative airspace disease. Hepatobiliary: Normal liver size. Simple 0.8 cm liver cyst in segment 8 of the right liver lobe. No additional discrete liver lesions. There is slightly irregular annular wall thickening and mucosal hyperenhancement in the fundal gallbladder wall, with loss of the normal fat plane between the fundal gallbladder wall and liver (series 2/ image 32). No radiopaque cholelithiasis. No significant gallbladder distention. No pericholecystic fluid. There is moderate diffuse intrahepatic biliary ductal dilatation. There is a focal stricture in the upper third of the common bile duct with associated common bile duct wall thickening and hyperenhancement (best seen on coronal series 5/ image 46). Pancreas: No pancreatic duct dilation. There is a heterogeneous hypodense 2.1 x 1.5 cm mass at the posterior superior margin of the pancreatic neck (series 2/ image 29), which demonstrates ill-defined margins with infiltrative soft tissue extending into the porta hepatis along the common bile duct. Spleen: Normal size. No mass. Adrenals/Urinary Tract: There is irregular thickening of the  bilateral adrenal glands, with the suggestion of small adrenal nodules bilaterally measuring 1.0 cm on the right (series 2/ image 24) and 1.3 cm on the left (series 2/ image 25). No hydronephrosis. Simple 2.0 cm interpolar left renal cyst. Simple 1.0 cm lower right renal cyst. No additional renal lesions. Limited visualization of the collapsed bladder due to streak artifact from left hip hardware with no gross bladder abnormality . Stomach/Bowel: Grossly normal stomach. Normal caliber small bowel with no small bowel wall thickening. Normal appendix . Mild sigmoid diverticulosis, with no large bowel wall thickening or pericolonic fat stranding. Vascular/Lymphatic: Atherosclerotic nonaneurysmal abdominal aorta. Patent portal, splenic, hepatic and renal veins. No pathologically enlarged lymph nodes in the abdomen or pelvis. Reproductive: Poorly visualized prostate due to streak artifact from the left hip hardware. Prostate appears mildly enlarged. Other: No pneumoperitoneum. No ascites. No focal fluid collection. There is a heterogeneously enhancing 1.5 x 1.4 cm left paracolic gutter peritoneal nodule (series 2/image 44). Musculoskeletal: There is widespread patchy sclerosis throughout the visualized thoracolumbar spine, sacrum, bilateral iliac bones and lower ribs bilaterally. Mild thoracolumbar spondylosis. Partially visualized left total hip arthroplasty. IMPRESSION: 1. Moderate diffuse intrahepatic biliary  ductal dilatation. 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 2. Hypodense 2.1 cm mass at the posterior superior margin of the pancreatic neck with associated infiltrative soft tissue extending into the porta hepatis. No main pancreatic duct dilation. This mass could represent a primary pancreatic adenocarcinoma or malignant peripancreatic lymph node. 3. Irregular annular wall thickening and hyperenhancement in the fundal gallbladder with loss of the normal fat plane between the  fundal gallbladder and liver. Cannot exclude gallbladder carcinoma or serosal carcinomatosis in this location. 4. Left paracolic gutter peritoneal nodule suspicious for peritoneal carcinomatosis. 5. Suggestion of small bilateral adrenal nodules, cannot exclude adrenal metastases. 6. MRI of the abdomen with MRCP without and with IV contrast may be useful for further evaluation of the above findings. 7. 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 Recommend correlation with serum PSA. 8. Additional findings include aortic atherosclerosis and mild sigmoid diverticulosis. Electronically Signed   By: Ilona Sorrel M.D.   On: 07/13/2016 18:21   Mr 3d Recon At Scanner  Result Date: 07/14/2016 CLINICAL DATA:  Common bile duct stricture. EXAM: MRI ABDOMEN WITHOUT AND WITH CONTRAST (INCLUDING MRCP) TECHNIQUE: Multiplanar multisequence MR imaging of the abdomen was performed both before and after the administration of intravenous contrast. Heavily T2-weighted images of the biliary and pancreatic ducts were obtained, and three-dimensional MRCP images were rendered by post processing. CONTRAST:  58m MULTIHANCE GADOBENATE DIMEGLUMINE 529 MG/ML IV SOLN COMPARISON:  CT 07/13/2016 has high FINDINGS: Lower chest:  Lung bases are clear. Hepatobiliary: There is intrahepatic and extrahepatic biliary duct dilatation. Stricturing of the bile duct at the junction of the common hepatic duct and common bile duct (image 12, series 9). More distal stricturing of the common hepatic duct (image 13, series 9). Both the strictures occur over approximately 1 cm segment. The common bile duct appears normal caliber at the ampullary level. Within the hepatic parenchyma. The bile ducts have a mild beaded appearance. There is suggestion of sludge within the bile ducts of the RIGHT hepatic lobe (image 17, series 3 image 16 series 3). There is a cholesterol gallstone within the  fundus of the gallbladder seen on the precontrast T1 weighted imaging (series 14). Pancreas: Along the ventral aspect of the pancreatic head there is a lesion which is either of the pancreas or adjacent the pancreas which is hypointense to normal pancreatic parenchyma measuring 20 mm image 35, series 4. This lesion is hypo enhancing on the arterial phase (image 34, series 15). Favor lesion to be and within the pancreatic parenchyma. There is no significant pancreatic duct dilatation. Spleen: Normal spleen. Adrenals/urinary tract: Thickening and adrenal glands which is incompletely characterized. Stomach/Bowel: Stomach and limited of the small bowel is unremarkable Vascular/Lymphatic: Abdominal aortic normal caliber. No retroperitoneal periportal lymphadenopathy. Musculoskeletal: Heterogeneous marrow signal within the entirety of the spine (image 19, series 189) para IMPRESSION: 1. Intrahepatic and extrahepatic duct dilatation with segments of bile duct stricturing in the common hepatic duct and common bile duct. Additionally there is mild beaded appearance of the intrahepatic ducts and cholestasis in the RIGHT hepatic lobe. Differential considerations include cholangiocarcinoma of the extrahepatic ducts, occult malignant stricturing of the extrahepatic ducts and primary sclerosing cholangitis with or without associated cholangiocarcinoma. 2. Lesion within or adjacent to the pancreatic head concerning for pancreatic neoplasm versus a malignant lymph node. Recommend endoscopic ultrasound with tissue sampling. Recommend tissue sampling of the extrahepatic ducts at same time. 3. Cholesterol gallstone within fundus of  the gallbladder. 4. 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 Electronically Signed   By: Suzy Bouchard M.D.   On: 07/14/2016 11:21   Nm Pet Image Initial (pi) Skull Base To Thigh  Result Date: 07/22/2016 CLINICAL DATA:  Initial treatment strategy for  pancreatic mass. Elevated CA 19-9 level. Personal history of prostate carcinoma. EXAM: NUCLEAR MEDICINE PET SKULL BASE TO THIGH TECHNIQUE: 13.1 mCi F-18 FDG was injected intravenously. Full-ring PET imaging was performed from the skull base to thigh after the radiotracer. CT data was obtained and used for attenuation correction and anatomic localization. FASTING BLOOD GLUCOSE:  Value: 139 mg/dl COMPARISON:  MRI on 07/14/2016 and CT on 07/13/2016 FINDINGS: NECK No hypermetabolic lymph nodes in the neck. CHEST 8 mm mediastinal lymph node in right paratracheal region is hypermetabolic with SUV max of 5.1. An 8 mm subcarinal mediastinal lymph node is also seen with low-grade metabolic activity with SUV max of 3.2. No suspicious pulmonary nodules seen by CT. Mild dependent atelectasis seen in both lung bases. ABDOMEN/PELVIS Internal and external biliary drainage catheter has been placed since prior study with resolution of biliary ductal dilatation since previous study. Focal hypermetabolism is seen in the area the pancreatic head surrounding the distal common bile duct stent, which has SUV max of 9.8. This is highly suspicious for primary pancreatic carcinoma. Small hypermetabolic peripancreatic lymph nodes are seen along the posterior aspect of the pancreatic head. Although difficult to visualized on noncontrast CT, This measures 9 mm approximately on image 159/3 and has SUV max of 8.1. Small hypermetabolic lymph nodes are seen in the porta hepatis, with index lymph node measuring 10 mm on image 141/3, with SUV max of 4.0. Mild hypermetabolic retroperitoneal lymphadenopathy seen in the aortocaval space with SUV max of 8.4. An 8 mm left retrocrural lymph node on image 450/3 is hypermetabolic, with SUV max of 8.2. Several small foci of hyper metabolism are seen in the liver dome in near the junction of the right and caudate lobes which have SUV max ranging from 3-4. No definite corresponding liver lesions are seen by CT,  however liver metastases cannot be excluded. A soft tissue nodule in the left paracolic gutter on image 888/KCMKLK 3 is hypermetabolic, with SUV max of 8.3. This is suspicious for peritoneal metastasis. SKELETON Hypermetabolic sclerotic bone metastases are seen throughout the neck, chest, abdomen, pelvis, proximal humeri, and proximal femora. IMPRESSION: Focal hyper metabolism in the pancreatic head surrounding the distal common bile duct stent, highly suspicious for primary pancreatic carcinoma. Mild hypermetabolic lymphadenopathy the peripancreatic region, porta hepatis, aortocaval space and left retrocrural region, consistent with lymph node metastases. Sub-cm hypermetabolic mediastinal lymph nodes, suspicious for lymph node metastases. Several small hypermetabolic foci within the liver. Liver metastases cannot be excluded. Hypermetabolic soft tissue nodule in the left paracolic gutter, suspicious for peritoneal metastasis. Diffuse hypermetabolic sclerotic bone metastases. Electronically Signed   By: Earle Gell M.D.   On: 07/22/2016 12:39   Dg Chest Port 1 View  Result Date: 07/27/2016 CLINICAL DATA:  Altered mental status, sepsis, hypertension, pancreatic lesion, smoking history EXAM: PORTABLE CHEST 1 VIEW COMPARISON:  PET-CT of 07/22/2016, and chest x-ray of 07/17/2013 FINDINGS: There is linear atelectasis or scarring at the right lung base. No infiltrate or effusion is seen. Mediastinal and hilar contours are unremarkable. The heart is within normal limits in size. A lytic lesion is noted involving the right posterolateral sixth rib. Also the PET-CT demonstrates multiple bony metastases. IMPRESSION: 1. Linear atelectasis  or scarring at the right lung base. No active infiltrate or effusion. 2. Lytic lesion involving the right posterolateral sixth rib. Electronically Signed   By: Ivar Drape M.D.   On: 07/27/2016 08:17   Dg C-arm 61-120 Min-no Report  Result Date: 07/16/2016 CLINICAL DATA: stricture   C-ARM 61-120 MINUTES Fluoroscopy was utilized by the requesting physician.  No radiographic interpretation.   Mr Abd W/wo Cm/mrcp  Result Date: 07/14/2016 CLINICAL DATA:  Common bile duct stricture. EXAM: MRI ABDOMEN WITHOUT AND WITH CONTRAST (INCLUDING MRCP) TECHNIQUE: Multiplanar multisequence MR imaging of the abdomen was performed both before and after the administration of intravenous contrast. Heavily T2-weighted images of the biliary and pancreatic ducts were obtained, and three-dimensional MRCP images were rendered by post processing. CONTRAST:  70m MULTIHANCE GADOBENATE DIMEGLUMINE 529 MG/ML IV SOLN COMPARISON:  CT 07/13/2016 has high FINDINGS: Lower chest:  Lung bases are clear. Hepatobiliary: There is intrahepatic and extrahepatic biliary duct dilatation. Stricturing of the bile duct at the junction of the common hepatic duct and common bile duct (image 12, series 9). More distal stricturing of the common hepatic duct (image 13, series 9). Both the strictures occur over approximately 1 cm segment. The common bile duct appears normal caliber at the ampullary level. Within the hepatic parenchyma. The bile ducts have a mild beaded appearance. There is suggestion of sludge within the bile ducts of the RIGHT hepatic lobe (image 17, series 3 image 16 series 3). There is a cholesterol gallstone within the fundus of the gallbladder seen on the precontrast T1 weighted imaging (series 14). Pancreas: Along the ventral aspect of the pancreatic head there is a lesion which is either of the pancreas or adjacent the pancreas which is hypointense to normal pancreatic parenchyma measuring 20 mm image 35, series 4. This lesion is hypo enhancing on the arterial phase (image 34, series 15). Favor lesion to be and within the pancreatic parenchyma. There is no significant pancreatic duct dilatation. Spleen: Normal spleen. Adrenals/urinary tract: Thickening and adrenal glands which is incompletely characterized.  Stomach/Bowel: Stomach and limited of the small bowel is unremarkable Vascular/Lymphatic: Abdominal aortic normal caliber. No retroperitoneal periportal lymphadenopathy. Musculoskeletal: Heterogeneous marrow signal within the entirety of the spine (image 19, series 189) para IMPRESSION: 1. Intrahepatic and extrahepatic duct dilatation with segments of bile duct stricturing in the common hepatic duct and common bile duct. Additionally there is mild beaded appearance of the intrahepatic ducts and cholestasis in the RIGHT hepatic lobe. Differential considerations include cholangiocarcinoma of the extrahepatic ducts, occult malignant stricturing of the extrahepatic ducts and primary sclerosing cholangitis with or without associated cholangiocarcinoma. 2. Lesion within or adjacent to the pancreatic head concerning for pancreatic neoplasm versus a malignant lymph node. Recommend endoscopic ultrasound with tissue sampling. Recommend tissue sampling of the extrahepatic ducts at same time. 3. Cholesterol gallstone within fundus of the gallbladder. 4. 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 Electronically Signed   By: SSuzy BouchardM.D.   On: 07/14/2016 11:21   Ir Int ELianne CureBiliary Drain With Cholangiogram  Result Date: 07/17/2016 INDICATION: BILIARY OBSTRUCTION, OBSTRUCTIVE JAUNDICE, UNSUCCESSFUL ERCP STENT INSERTION EXAM: IR INT-EXT BILIARY DRAIN W/ CHOLANGIOGRAM MEDICATIONS: 3.375 g Zosyn; The antibiotic was administered within an appropriate time frame prior to the initiation of the procedure. ANESTHESIA/SEDATION: Moderate (conscious) sedation was employed during this procedure. A total of Versed 2.0 mg and Fentanyl 200 mcg was administered intravenously. Moderate Sedation Time: 30 minutes. The patient's level of consciousness and vital signs were  monitored continuously by radiology nursing throughout the procedure under my direct supervision. FLUOROSCOPY TIME:   Fluoroscopy Time: 3 minutes 24 seconds (73 mGy). COMPLICATIONS: None immediate. PROCEDURE: Informed written consent was obtained from the patient after a thorough discussion of the procedural risks, benefits and alternatives. All questions were addressed. Maximal Sterile Barrier Technique was utilized including caps, mask, sterile gowns, sterile gloves, sterile drape, hand hygiene and skin antiseptic. A timeout was performed prior to the initiation of the procedure. Previous imaging reviewed. Under sterile conditions and local anesthesia, ultrasound percutaneous needle access performed of a peripheral right hepatic duct in the mid axillary line through a lower intercostal space. There was return of bile. Contrast injection performed for a cholangiogram. Cholangiogram confirms diffuse biliary obstruction and marked dilatation. 018 guidewire advanced followed by the Accustick dilator set. Outer dilator advanced into the distal CBD. Contrast injection confirms position. Amplatz guidewire advanced into the duodenum. Tract dilatation performed to advance a 10 [place] internal external biliary drain. Retention loop formed in the duodenum. Peripheral marker within the right hepatic duct. Contrast injection confirms drainage of the right and left ducts adequately. Gravity drainage bag connected. Catheter secured with a Prolene suture and a sterile dressing. No immediate complication. Patient tolerated the procedure well. IMPRESSION: Successful ultrasound and fluoroscopic 10 French right internal external biliary drain. Electronically Signed   By: Jerilynn Mages.  Shick M.D.   On: 07/17/2016 18:27    ASSESSMENT: Altered mental status, likely stage IV pancreatic cancer, hypercalcemia  PLAN:    1. Likely stage IV pancreatic cancer: Patient had ERCP as well as percutaneous cannulation of his bile duct system. Despite the high suspicion of malignancy, biopsy and cytology were negative. If patient's performance status improves and  desires aggressive treatment, can consider CT-guided biopsy performed perhaps of lesion in his paracolic gutter. 2. Hypercalcemia: Possibly contributing to his ultimate mental status. Continue aggressive IV fluid resuscitation and will give 4 mg IV Zometa. 3. Altered metal status: Multifactorial including dehydration, hypercalcemia, and progressive malignancy. Consider CT scan of head for further evaluation. 4. Leukocytosis: Likely reactive, monitor. 5. Disposition: Appreciate palliative care input. Have discussed hospice with the family, but they would like to wait 24-48 hours to see if patient's mental status recovers and then decide whether to pursue aggressive treatment or comfort care.  Appreciate consult, will follow.  Lloyd Huger, MD   07/27/2016 10:31 PM

## 2016-07-28 ENCOUNTER — Inpatient Hospital Stay: Payer: No Typology Code available for payment source

## 2016-07-28 LAB — BASIC METABOLIC PANEL
ANION GAP: 8 (ref 5–15)
BUN: 23 mg/dL — AB (ref 6–20)
CALCIUM: 11.4 mg/dL — AB (ref 8.9–10.3)
CO2: 32 mmol/L (ref 22–32)
Chloride: 97 mmol/L — ABNORMAL LOW (ref 101–111)
Creatinine, Ser: 0.7 mg/dL (ref 0.61–1.24)
GFR calc Af Amer: >60 mL/min (ref >60)
GLUCOSE: 94 mg/dL (ref 65–99)
Potassium: 3.6 mmol/L (ref 3.5–5.1)
Sodium: 137 mmol/L (ref 135–145)

## 2016-07-28 LAB — URINE CULTURE: Culture: NO GROWTH

## 2016-07-28 LAB — CBC
HCT: 47.6 % (ref 40.0–52.0)
HEMOGLOBIN: 16.6 g/dL (ref 13.0–18.0)
MCH: 30.8 pg (ref 26.0–34.0)
MCHC: 35 g/dL (ref 32.0–36.0)
MCV: 88.2 fL (ref 80.0–100.0)
Platelets: 258 10*3/uL (ref 150–440)
RBC: 5.4 MIL/uL (ref 4.40–5.90)
RDW: 14.6 % — AB (ref 11.5–14.5)
WBC: 14.6 10*3/uL — ABNORMAL HIGH (ref 3.8–10.6)

## 2016-07-28 LAB — TROPONIN I
TROPONIN I: 0.04 ng/mL — AB (ref <0.03)
TROPONIN I: 0.04 ng/mL — AB (ref <0.03)

## 2016-07-28 MED ORDER — SODIUM CHLORIDE 0.9 % IV SOLN
Freq: Once | INTRAVENOUS | Status: AC
  Administered 2016-07-28: 14:00:00 via INTRAVENOUS

## 2016-07-28 MED ORDER — ENSURE ENLIVE PO LIQD
237.0000 mL | Freq: Three times a day (TID) | ORAL | Status: DC
  Administered 2016-07-28 – 2016-07-31 (×7): 237 mL via ORAL

## 2016-07-28 MED ORDER — METOPROLOL TARTRATE 5 MG/5ML IV SOLN: 5.0000 mg | INTRAVENOUS | Status: DC | PRN

## 2016-07-28 MED ORDER — BISACODYL 10 MG RE SUPP
10.0000 mg | Freq: Every day | RECTAL | Status: DC
  Administered 2016-07-28 – 2016-07-31 (×2): 10 mg via RECTAL
  Filled 2016-07-28 (×3): qty 1

## 2016-07-28 NOTE — Clinical Social Work Note (Signed)
CSW received consult. Patient's family wishes to wait 24-48 hours to discuss comfort care v aggressive measures. CSW will con't to follow.  Santiago Bumpers, MSW, LCSW-A [phone]

## 2016-07-28 NOTE — Progress Notes (Signed)
Initial Nutrition Assessment  DOCUMENTATION CODES:   Severe malnutrition in context of acute illness/injury (likely to progress to Chronic)  INTERVENTION:  -Recommend Magic Cup at [employer] (or more often if requested) -Recommend Ensure Enlive po TID, each supplement provides 350 kcal and 20 grams of protein  -Agree with Regular Diet; cater to preferences   NUTRITION DIAGNOSIS:   Malnutrition related to cancer and cancer related treatments, acute illness as evidenced by percent weight loss, energy intake < or equal to 50% for > or equal to 5 days.  GOAL:   Patient will meet greater than or equal to 90% of their needs  MONITOR:   PO intake, Supplement acceptance, Labs, Weight trends  REASON FOR ASSESSMENT:   Malnutrition Screening Tool    ASSESSMENT:   65 yo male admitted with progressive weakness, fatigue and AMS. Pt with poor appetite with minimal po intake for the past several weeks. Pt with possible stage IV pancreatic cancer. Pt with biliary tube in place  Wife at bedside, pt mental status remains altered. Pt moving about in the bed but does not open eyes or pariticipate in exam. Wife reports pt with very poor appetite with poor po intake for 3-4 weeks. Pt mainly eating liquid or soft foods per wife, meal might consist of soup or mashed potatoes with pt eating 2 "meals" per day. Wife reports pt was told to drink Ensure TID but pt has only been drinking 1 per day. Wife reports that several months ago pt weighed 175-185 pounds; current wt 151 pounds. 13.7-18.4% wt loss in few months. Weight on admission earlier this month was 158 pounds (8/15); 4.4% wt loss in 2 weeks.   Nutrition-Focused physical exam completed. Findings are mild fat depletion, mild muscle depletion, and no edema.    Past Medical History:  Diagnosis Date  . Cancer [facility])    pancreatic cancer- Aug 2017  . Carpal tunnel syndrome   . History of measles   . Hyperplastic colonic polyp 2006   Excised  Dr. Tiffany Kocher  . Hypertension   . Osteoarthrosis   . Prostate nodule      Diet Order:  Diet regular Room service appropriate? Yes; Fluid consistency: Thin  Skin:  Reviewed, no issues  Last BM:  no documented BM   Labs: calcium 11.4  Meds: NS at 75 ml/hr  Height:   Ht Readings from Last 1 Encounters:  07/27/16 6' (1.829 m)    Weight:   Wt Readings from Last 1 Encounters:  07/27/16 151 lb 8 oz (68.7 kg)   BMI:  Body mass index is 20.55 kg/m.  Estimated Nutritional Needs:   Kcal:  2100-2400 kcals  Protein:  85-105 g  Fluid:  >/= 2 L  EDUCATION NEEDS:   No education needs identified at this time  [place], [place], LDN [phone] Pager  [phone] Weekend/On-Call Pager

## 2016-07-28 NOTE — Care Management (Signed)
Admitted to [facility] with the diagnosis of altered mental status. Lives with wife, Olin Hauser, [phone]). Last seen Dr, Caryn Section 07/12/16. Transferred from this facility to [facility] 07/15/16 for ERCP. Discharged from [facility] 07/20/16. Uses no aids for ambulation. No home health in the past. No skilled facility in the past. No home oxygen. Takes care of all basic and instrumental activities of daily living himself. Prescriptions are filled at [facility] in [facility]. Good family support. Shelbie Ammons RN MSN CCM Care Management [phone]

## 2016-07-28 NOTE — Progress Notes (Signed)
Dr Margaretmary Eddy saw pt this noontime and spoke with pts wife/sister. Pt  Still mostly  Lying with eyes closed.  Will nod head  At times when spoken to oterwise will open eyes periodically., no resp distress. 02 sats  Mid 90s on room air.dr Grayland Ormond to see pt today around lunch.he spoke with family on consult on yesterday and will  Wait 24-48 hrs to see pts response and make a decision after  That.

## 2016-07-28 NOTE — Progress Notes (Signed)
[place]  [phone] [phone]  ID: Francisco Charles OB: 1951/07/19  MR#: [id number]  [facility]#:[id number]  Patient Care Team: Birdie Sons, MD as PCP - General (Family Medicine) Murrell Redden, MD as Consulting Physician (Urology) Manya Silvas, MD (Gastroenterology) Clent Jacks, RN as Registered Nurse  CHIEF COMPLAINT: Altered mental status, likely stage IV pancreatic cancer, hypercalcemia  INTERVAL HISTORY: Patient still with confusion and altered mental status, but per family and nursing has improved and was able date several bites of once today. Review of systems is still unobtainable.  REVIEW OF SYSTEMS:   Review of Systems  Unable to perform ROS: Mental status change    As per HPI. Otherwise, a complete review of systems is negative.  PAST MEDICAL HISTORY: Past Medical History:  Diagnosis Date  . Cancer [facility])    pancreatic cancer- Aug 2017  . Carpal tunnel syndrome   . History of measles   . Hyperplastic colonic polyp 2006   Excised Dr. Tiffany Kocher  . Hypertension   . Osteoarthrosis   . Prostate nodule     PAST SURGICAL HISTORY: Past Surgical History:  Procedure Laterality Date  . ERCP N/A 07/16/2016   Procedure: ENDOSCOPIC RETROGRADE CHOLANGIOPANCREATOGRAPHY (ERCP);  Surgeon: Carol Ada, MD;  Location: [facility];  Service: Endoscopy;  Laterality: N/A;  . EUS Left 07/19/2016   Procedure: UPPER ENDOSCOPIC ULTRASOUND (EUS) LINEAR;  Surgeon: Carol Ada, MD;  Location: [facility] ENDOSCOPY;  Service: Endoscopy;  Laterality: Left;  . IR GENERIC HISTORICAL  07/17/2016   IR INT EXT BILIARY DRAIN WITH CHOLANGIOGRAM 07/17/2016 Greggory Keen, MD [facility]-INTERV RAD  . JOINT REPLACEMENT Left 10-31-2000  . PROSTATE SURGERY  09/23/2011   Prostate Biopsy Dr. Jacqlyn Larsen  . TOTAL HIP ARTHROPLASTY Left 2001  . TOTAL HIP REVISION Left 07/23/2013   Procedure: LEFT TOTAL HIP REVISION ARTHROPLASTY WITH BONE GRAFT;  Surgeon: Mauri Pole, MD;  Location:  [facility] ORS;  Service: Orthopedics;  Laterality: Left;    FAMILY HISTORY: Family History  Problem Relation Age of Onset  . Heart attack Mother   . Cancer Mother   . Heart attack Brother   . Diabetes Neg Hx   . Heart disease Neg Hx        ADVANCED DIRECTIVES (Y/N):  @ADVDIR @   HEALTH MAINTENANCE: Social History  Substance Use Topics  . Smoking status: Current Every Day Smoker    Packs/day: 0.50    Years: 30.00    Types: Cigarettes    Last attempt to quit: 11/29/2012  . Smokeless tobacco: Never Used  . Alcohol use No     Colonoscopy:  PAP:  Bone density:  Lipid panel:  No Known Allergies  Current Facility-Administered Medications  Medication Dose Route Frequency Provider Last Rate Last Dose  . bisacodyl (DULCOLAX) suppository 10 mg  10 mg Rectal Daily Nicholes Mango, MD   10 mg at 07/28/16 1523  . feeding supplement (ENSURE ENLIVE) (ENSURE ENLIVE) liquid 237 mL  237 mL Oral TID BM Aruna Gouru, MD      . heparin injection 5,000 Units  5,000 Units Subcutaneous Q8H Vaughan Basta, MD   5,000 Units at 07/28/16 1524  . methocarbamol (ROBAXIN) tablet 750 mg  750 mg Oral Q6H PRN Vaughan Basta, MD      . metoprolol (LOPRESSOR) injection 5 mg  5 mg Intravenous Q4H PRN Nicholes Mango, MD      . morphine 2 MG/ML injection 2 mg  2 mg Intravenous Q2H PRN Knox Royalty, NP      .  pantoprazole (PROTONIX) EC tablet 40 mg  40 mg Oral Daily Vaughan Basta, MD      . traMADol (ULTRAM) tablet 100 mg  100 mg Oral Q6H PRN Vaughan Basta, MD        OBJECTIVE: Vitals:   07/28/16 0349 07/28/16 1325  BP: (!) 152/78 (!) 174/91  Pulse: 82 86  Resp: 16 18  Temp: 97.7 F (36.5 C) 97.7 F (36.5 C)     Body mass index is 20.55 kg/m.    ECOG FS:4 - Bedbound  General: Ill-appearing, no acute distress. Eyes: Pink conjunctiva, anicteric sclera. Lungs: Clear to auscultation bilaterally. Heart: Regular rate and rhythm. No rubs, murmurs, or gallops. Abdomen: Soft, nontender,  nondistended. Percutaneous bile duct tube noted.. Musculoskeletal: No edema, cyanosis, or clubbing. Neuro: Lethargic. Skin: No rashes or petechiae noted.   LAB RESULTS:  Lab Results  Component Value Date   NA 137 07/28/2016   K 3.6 07/28/2016   CL 97 (L) 07/28/2016   CO2 32 07/28/2016   GLUCOSE 94 07/28/2016   BUN 23 (H) 07/28/2016   CREATININE 0.70 07/28/2016   CALCIUM 11.4 (H) 07/28/2016   PROT 8.0 07/27/2016   ALBUMIN 3.6 07/27/2016   AST 90 (H) 07/27/2016   ALT 171 (H) 07/27/2016   ALKPHOS 704 (H) 07/27/2016   BILITOT 4.7 (H) 07/27/2016   GFRNONAA >60 07/28/2016   GFRAA >60 07/28/2016    Lab Results  Component Value Date   WBC 14.6 (H) 07/28/2016   NEUTROABS 17.6 (H) 07/27/2016   HGB 16.6 07/28/2016   HCT 47.6 07/28/2016   MCV 88.2 07/28/2016   PLT 258 07/28/2016     STUDIES: Ct Abdomen Pelvis W Contrast  Result Date: 07/27/2016 CLINICAL DATA:  Sepsis ; history sepsis, essential hypertension, prostate cancer, pancreatic mass of elevated CA 19 9 EXAM: CT ABDOMEN AND PELVIS WITH CONTRAST TECHNIQUE: Multidetector CT imaging of the abdomen and pelvis was performed using the standard protocol following bolus administration of intravenous contrast. Sagittal and coronal MPR images reconstructed from axial data set. CONTRAST:  133m ISOVUE-300 IOPAMIDOL (ISOVUE-300) INJECTION 61% IV. Dilute oral contrast. COMPARISON:  PET-CT 07/22/2016 FINDINGS: Lower chest:  Atelectasis and volume loss in BILATERAL lower lobes. Hepatobiliary: PTC drain traverses liver and CBD into duodenum. Mild intrahepatic biliary dilatation. Gallbladder contracted. 8 x 6 mm low-attenuation RIGHT lobe liver nodule again seen. Pancreas: Minimal prominence of pancreatic head though discrete mass is not visualized. This corresponds with the area of hypermetabolism on prior PET-CT. No definite pancreatic ductal dilatation or calcification. Spleen: Normal appearance Adrenals/Urinary Tract: Mild diffuse thickening  of LEFT adrenal gland with question nodule medial limb 14 x 13 mm diameter image 34. RIGHT adrenal gland unremarkable. Small BILATERAL renal cysts without additional renal mass. No hydronephrosis, ureteral calcification or ureteral dilatation. Bladder unremarkable. Prostate gland/bed limited assessment due to beam hardening artifacts from LEFT hip prosthesis. Stomach/Bowel: Appendix filled with contrast. Few scattered distal colonic diverticula pre Large and small bowel loops otherwise unremarkable. Stomach inadequately distended, unable to exclude areas of wall thickening. Vascular/Lymphatic: Scattered atherosclerotic calcifications. Aorta normal caliber. Peripancreatic nodes identified on prior PET-CT are less well visualized, LN 11 mm aortocaval node image 31 is noted, hypermetabolic on prior CT. Reproductive: N/A Other: Soft tissue nodule enhancing margin at LEFT pericolic gutter 20 x 15 mm image 51, suspect peritoneal metastasis, was hypermetabolic on PET-CT. Question mild infiltration of transverse mesocolon see image 39 and small mesenteric. No free air or free fluid. Small umbilical hernia containing fat. Musculoskeletal: Numerous sclerotic  foci throughout osseous structures compatible with widespread osseous metastatic disease. In addition there are areas of lytic lucency suspicious for lytic metastases within ribs and vertebra. LEFT hip prosthesis. IMPRESSION: Pancreatic head mass post PTC with minimal residual intrahepatic biliary dilatation. Probable peripancreatic nodal and LEFT paracolic gutter peritoneal metastases. Osseous metastases. Bibasilar atelectasis without definite lung base metastasis. Mild distal colonic diverticulosis. Small umbilical hernia. Probable LEFT adrenal nodule 14 x 13 mm, indeterminate. Question mild infiltrative changes within transverse mesocolon and small bowel mesenteric. Electronically Signed   By: Lavonia Dana M.D.   On: 07/27/2016 09:48   Ct Abdomen Pelvis W  Contrast  Result Date: 07/13/2016 CLINICAL DATA:  65 year old male with right abdominal pain, most prominent in the right lower quadrant. Elevated liver function tests. Postprandial nausea. History of prostate biopsy. EXAM: CT ABDOMEN AND PELVIS WITH CONTRAST TECHNIQUE: Multidetector CT imaging of the abdomen and pelvis was performed using the standard protocol following bolus administration of intravenous contrast. CONTRAST:  129m ISOVUE-300 IOPAMIDOL (ISOVUE-300) INJECTION 61% COMPARISON:  09/29/2012 bone protocol CT pelvis. No prior CT abdomen/pelvis. FINDINGS: Lower chest: No significant pulmonary nodules or acute consolidative airspace disease. Hepatobiliary: Normal liver size. Simple 0.8 cm liver cyst in segment 8 of the right liver lobe. No additional discrete liver lesions. There is slightly irregular annular wall thickening and mucosal hyperenhancement in the fundal gallbladder wall, with loss of the normal fat plane between the fundal gallbladder wall and liver (series 2/ image 32). No radiopaque cholelithiasis. No significant gallbladder distention. No pericholecystic fluid. There is moderate diffuse intrahepatic biliary ductal dilatation. There is a focal stricture in the upper third of the common bile duct with associated common bile duct wall thickening and hyperenhancement (best seen on coronal series 5/ image 46). Pancreas: No pancreatic duct dilation. There is a heterogeneous hypodense 2.1 x 1.5 cm mass at the posterior superior margin of the pancreatic neck (series 2/ image 29), which demonstrates ill-defined margins with infiltrative soft tissue extending into the porta hepatis along the common bile duct. Spleen: Normal size. No mass. Adrenals/Urinary Tract: There is irregular thickening of the bilateral adrenal glands, with the suggestion of small adrenal nodules bilaterally measuring 1.0 cm on the right (series 2/ image 24) and 1.3 cm on the left (series 2/ image 25). No hydronephrosis.  Simple 2.0 cm interpolar left renal cyst. Simple 1.0 cm lower right renal cyst. No additional renal lesions. Limited visualization of the collapsed bladder due to streak artifact from left hip hardware with no gross bladder abnormality . Stomach/Bowel: Grossly normal stomach. Normal caliber small bowel with no small bowel wall thickening. Normal appendix . Mild sigmoid diverticulosis, with no large bowel wall thickening or pericolonic fat stranding. Vascular/Lymphatic: Atherosclerotic nonaneurysmal abdominal aorta. Patent portal, splenic, hepatic and renal veins. No pathologically enlarged lymph nodes in the abdomen or pelvis. Reproductive: Poorly visualized prostate due to streak artifact from the left hip hardware. Prostate appears mildly enlarged. Other: No pneumoperitoneum. No ascites. No focal fluid collection. There is a heterogeneously enhancing 1.5 x 1.4 cm left paracolic gutter peritoneal nodule (series 2/image 44). Musculoskeletal: There is widespread patchy sclerosis throughout the visualized thoracolumbar spine, sacrum, bilateral iliac bones and lower ribs bilaterally. Mild thoracolumbar spondylosis. Partially visualized left total hip arthroplasty. IMPRESSION: 1. Moderate diffuse intrahepatic biliary ductal dilatation. 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 2. Hypodense 2.1 cm mass at the posterior superior margin of the pancreatic neck with associated infiltrative soft tissue extending into the porta hepatis. No  main pancreatic duct dilation. This mass could represent a primary pancreatic adenocarcinoma or malignant peripancreatic lymph node. 3. Irregular annular wall thickening and hyperenhancement in the fundal gallbladder with loss of the normal fat plane between the fundal gallbladder and liver. Cannot exclude gallbladder carcinoma or serosal carcinomatosis in this location. 4. Left paracolic gutter peritoneal nodule suspicious for peritoneal carcinomatosis. 5.  Suggestion of small bilateral adrenal nodules, cannot exclude adrenal metastases. 6. MRI of the abdomen with MRCP without and with IV contrast may be useful for further evaluation of the above findings. 7. 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 Recommend correlation with serum PSA. 8. Additional findings include aortic atherosclerosis and mild sigmoid diverticulosis. Electronically Signed   By: Ilona Sorrel M.D.   On: 07/13/2016 18:21   Mr 3d Recon At Scanner  Result Date: 07/14/2016 CLINICAL DATA:  Common bile duct stricture. EXAM: MRI ABDOMEN WITHOUT AND WITH CONTRAST (INCLUDING MRCP) TECHNIQUE: Multiplanar multisequence MR imaging of the abdomen was performed both before and after the administration of intravenous contrast. Heavily T2-weighted images of the biliary and pancreatic ducts were obtained, and three-dimensional MRCP images were rendered by post processing. CONTRAST:  67m MULTIHANCE GADOBENATE DIMEGLUMINE 529 MG/ML IV SOLN COMPARISON:  CT 07/13/2016 has high FINDINGS: Lower chest:  Lung bases are clear. Hepatobiliary: There is intrahepatic and extrahepatic biliary duct dilatation. Stricturing of the bile duct at the junction of the common hepatic duct and common bile duct (image 12, series 9). More distal stricturing of the common hepatic duct (image 13, series 9). Both the strictures occur over approximately 1 cm segment. The common bile duct appears normal caliber at the ampullary level. Within the hepatic parenchyma. The bile ducts have a mild beaded appearance. There is suggestion of sludge within the bile ducts of the RIGHT hepatic lobe (image 17, series 3 image 16 series 3). There is a cholesterol gallstone within the fundus of the gallbladder seen on the precontrast T1 weighted imaging (series 14). Pancreas: Along the ventral aspect of the pancreatic head there is a lesion which is either of the pancreas or adjacent  the pancreas which is hypointense to normal pancreatic parenchyma measuring 20 mm image 35, series 4. This lesion is hypo enhancing on the arterial phase (image 34, series 15). Favor lesion to be and within the pancreatic parenchyma. There is no significant pancreatic duct dilatation. Spleen: Normal spleen. Adrenals/urinary tract: Thickening and adrenal glands which is incompletely characterized. Stomach/Bowel: Stomach and limited of the small bowel is unremarkable Vascular/Lymphatic: Abdominal aortic normal caliber. No retroperitoneal periportal lymphadenopathy. Musculoskeletal: Heterogeneous marrow signal within the entirety of the spine (image 19, series 189) para IMPRESSION: 1. Intrahepatic and extrahepatic duct dilatation with segments of bile duct stricturing in the common hepatic duct and common bile duct. Additionally there is mild beaded appearance of the intrahepatic ducts and cholestasis in the RIGHT hepatic lobe. Differential considerations include cholangiocarcinoma of the extrahepatic ducts, occult malignant stricturing of the extrahepatic ducts and primary sclerosing cholangitis with or without associated cholangiocarcinoma. 2. Lesion within or adjacent to the pancreatic head concerning for pancreatic neoplasm versus a malignant lymph node. Recommend endoscopic ultrasound with tissue sampling. Recommend tissue sampling of the extrahepatic ducts at same time. 3. Cholesterol gallstone within fundus of the gallbladder. 4. 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 Electronically Signed   By: SSuzy BouchardM.D.   On: 07/14/2016 11:21   Nm Pet Image Initial (pi) Skull Base To Thigh  Result Date: 07/22/2016 CLINICAL DATA:  Initial treatment strategy for pancreatic mass. Elevated CA 19-9 level. Personal history of prostate carcinoma. EXAM: NUCLEAR MEDICINE PET SKULL BASE TO THIGH TECHNIQUE: 13.1 mCi F-18 FDG was injected intravenously. Full-ring PET imaging  was performed from the skull base to thigh after the radiotracer. CT data was obtained and used for attenuation correction and anatomic localization. FASTING BLOOD GLUCOSE:  Value: 139 mg/dl COMPARISON:  MRI on 07/14/2016 and CT on 07/13/2016 FINDINGS: NECK No hypermetabolic lymph nodes in the neck. CHEST 8 mm mediastinal lymph node in right paratracheal region is hypermetabolic with SUV max of 5.1. An 8 mm subcarinal mediastinal lymph node is also seen with low-grade metabolic activity with SUV max of 3.2. No suspicious pulmonary nodules seen by CT. Mild dependent atelectasis seen in both lung bases. ABDOMEN/PELVIS Internal and external biliary drainage catheter has been placed since prior study with resolution of biliary ductal dilatation since previous study. Focal hypermetabolism is seen in the area the pancreatic head surrounding the distal common bile duct stent, which has SUV max of 9.8. This is highly suspicious for primary pancreatic carcinoma. Small hypermetabolic peripancreatic lymph nodes are seen along the posterior aspect of the pancreatic head. Although difficult to visualized on noncontrast CT, This measures 9 mm approximately on image 159/3 and has SUV max of 8.1. Small hypermetabolic lymph nodes are seen in the porta hepatis, with index lymph node measuring 10 mm on image 141/3, with SUV max of 4.0. Mild hypermetabolic retroperitoneal lymphadenopathy seen in the aortocaval space with SUV max of 8.4. An 8 mm left retrocrural lymph node on image 161/0 is hypermetabolic, with SUV max of 8.2. Several small foci of hyper metabolism are seen in the liver dome in near the junction of the right and caudate lobes which have SUV max ranging from 3-4. No definite corresponding liver lesions are seen by CT, however liver metastases cannot be excluded. A soft tissue nodule in the left paracolic gutter on image 960/AVWUJW 3 is hypermetabolic, with SUV max of 8.3. This is suspicious for peritoneal metastasis.  SKELETON Hypermetabolic sclerotic bone metastases are seen throughout the neck, chest, abdomen, pelvis, proximal humeri, and proximal femora. IMPRESSION: Focal hyper metabolism in the pancreatic head surrounding the distal common bile duct stent, highly suspicious for primary pancreatic carcinoma. Mild hypermetabolic lymphadenopathy the peripancreatic region, porta hepatis, aortocaval space and left retrocrural region, consistent with lymph node metastases. Sub-cm hypermetabolic mediastinal lymph nodes, suspicious for lymph node metastases. Several small hypermetabolic foci within the liver. Liver metastases cannot be excluded. Hypermetabolic soft tissue nodule in the left paracolic gutter, suspicious for peritoneal metastasis. Diffuse hypermetabolic sclerotic bone metastases. Electronically Signed   By: Earle Gell M.D.   On: 07/22/2016 12:39   Dg Chest Port 1 View  Result Date: 07/27/2016 CLINICAL DATA:  Altered mental status, sepsis, hypertension, pancreatic lesion, smoking history EXAM: PORTABLE CHEST 1 VIEW COMPARISON:  PET-CT of 07/22/2016, and chest x-ray of 07/17/2013 FINDINGS: There is linear atelectasis or scarring at the right lung base. No infiltrate or effusion is seen. Mediastinal and hilar contours are unremarkable. The heart is within normal limits in size. A lytic lesion is noted involving the right posterolateral sixth rib. Also the PET-CT demonstrates multiple bony metastases. IMPRESSION: 1. Linear atelectasis or scarring at the right lung base. No active infiltrate or effusion. 2. Lytic lesion involving the right posterolateral sixth rib. Electronically Signed   By: Ivar Drape M.D.   On: 07/27/2016 08:17   Dg C-arm 61-120  Min-no Report  Result Date: 07/16/2016 CLINICAL DATA: stricture  C-ARM 61-120 MINUTES Fluoroscopy was utilized by the requesting physician.  No radiographic interpretation.   Mr Abd W/wo Cm/mrcp  Result Date: 07/14/2016 CLINICAL DATA:  Common bile duct stricture.  EXAM: MRI ABDOMEN WITHOUT AND WITH CONTRAST (INCLUDING MRCP) TECHNIQUE: Multiplanar multisequence MR imaging of the abdomen was performed both before and after the administration of intravenous contrast. Heavily T2-weighted images of the biliary and pancreatic ducts were obtained, and three-dimensional MRCP images were rendered by post processing. CONTRAST:  41m MULTIHANCE GADOBENATE DIMEGLUMINE 529 MG/ML IV SOLN COMPARISON:  CT 07/13/2016 has high FINDINGS: Lower chest:  Lung bases are clear. Hepatobiliary: There is intrahepatic and extrahepatic biliary duct dilatation. Stricturing of the bile duct at the junction of the common hepatic duct and common bile duct (image 12, series 9). More distal stricturing of the common hepatic duct (image 13, series 9). Both the strictures occur over approximately 1 cm segment. The common bile duct appears normal caliber at the ampullary level. Within the hepatic parenchyma. The bile ducts have a mild beaded appearance. There is suggestion of sludge within the bile ducts of the RIGHT hepatic lobe (image 17, series 3 image 16 series 3). There is a cholesterol gallstone within the fundus of the gallbladder seen on the precontrast T1 weighted imaging (series 14). Pancreas: Along the ventral aspect of the pancreatic head there is a lesion which is either of the pancreas or adjacent the pancreas which is hypointense to normal pancreatic parenchyma measuring 20 mm image 35, series 4. This lesion is hypo enhancing on the arterial phase (image 34, series 15). Favor lesion to be and within the pancreatic parenchyma. There is no significant pancreatic duct dilatation. Spleen: Normal spleen. Adrenals/urinary tract: Thickening and adrenal glands which is incompletely characterized. Stomach/Bowel: Stomach and limited of the small bowel is unremarkable Vascular/Lymphatic: Abdominal aortic normal caliber. No retroperitoneal periportal lymphadenopathy. Musculoskeletal: Heterogeneous marrow  signal within the entirety of the spine (image 19, series 189) para IMPRESSION: 1. Intrahepatic and extrahepatic duct dilatation with segments of bile duct stricturing in the common hepatic duct and common bile duct. Additionally there is mild beaded appearance of the intrahepatic ducts and cholestasis in the RIGHT hepatic lobe. Differential considerations include cholangiocarcinoma of the extrahepatic ducts, occult malignant stricturing of the extrahepatic ducts and primary sclerosing cholangitis with or without associated cholangiocarcinoma. 2. Lesion within or adjacent to the pancreatic head concerning for pancreatic neoplasm versus a malignant lymph node. Recommend endoscopic ultrasound with tissue sampling. Recommend tissue sampling of the extrahepatic ducts at same time. 3. Cholesterol gallstone within fundus of the gallbladder. 4. 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 Electronically Signed   By: SSuzy BouchardM.D.   On: 07/14/2016 11:21   Ir Int ELianne CureBiliary Drain With Cholangiogram  Result Date: 07/17/2016 INDICATION: BILIARY OBSTRUCTION, OBSTRUCTIVE JAUNDICE, UNSUCCESSFUL ERCP STENT INSERTION EXAM: IR INT-EXT BILIARY DRAIN W/ CHOLANGIOGRAM MEDICATIONS: 3.375 g Zosyn; The antibiotic was administered within an appropriate time frame prior to the initiation of the procedure. ANESTHESIA/SEDATION: Moderate (conscious) sedation was employed during this procedure. A total of Versed 2.0 mg and Fentanyl 200 mcg was administered intravenously. Moderate Sedation Time: 30 minutes. The patient's level of consciousness and vital signs were monitored continuously by radiology nursing throughout the procedure under my direct supervision. FLUOROSCOPY TIME:  Fluoroscopy Time: 3 minutes 24 seconds (73 mGy). COMPLICATIONS: None immediate. PROCEDURE: Informed written consent was obtained from the patient after a thorough discussion of  the procedural risks, benefits and  alternatives. All questions were addressed. Maximal Sterile Barrier Technique was utilized including caps, mask, sterile gowns, sterile gloves, sterile drape, hand hygiene and skin antiseptic. A timeout was performed prior to the initiation of the procedure. Previous imaging reviewed. Under sterile conditions and local anesthesia, ultrasound percutaneous needle access performed of a peripheral right hepatic duct in the mid axillary line through a lower intercostal space. There was return of bile. Contrast injection performed for a cholangiogram. Cholangiogram confirms diffuse biliary obstruction and marked dilatation. 018 guidewire advanced followed by the Accustick dilator set. Outer dilator advanced into the distal CBD. Contrast injection confirms position. Amplatz guidewire advanced into the duodenum. Tract dilatation performed to advance a 10 [place] internal external biliary drain. Retention loop formed in the duodenum. Peripheral marker within the right hepatic duct. Contrast injection confirms drainage of the right and left ducts adequately. Gravity drainage bag connected. Catheter secured with a Prolene suture and a sterile dressing. No immediate complication. Patient tolerated the procedure well. IMPRESSION: Successful ultrasound and fluoroscopic 10 [place] right internal external biliary drain. Electronically Signed   By: Jerilynn Mages.  Shick M.D.   On: 07/17/2016 18:27    ASSESSMENT: Altered mental status, likely stage IV pancreatic cancer, hypercalcemia  PLAN:    1. Likely stage IV pancreatic cancer: Patient had ERCP as well as percutaneous cannulation of his bile duct system. Despite the high suspicion of malignancy, biopsy and cytology were negative. If patient's performance status improves and desires aggressive treatment, can consider CT-guided biopsy performed perhaps of lesion in his paracolic gutter. 2. Hypercalcemia: Possibly contributing to his ultimate mental status. Continue aggressive IV fluid  resuscitation patient received 3.5 mg IV Zometa yesterday with improvement of his calcium. Continue to monitor closely.  3. Altered metal status: Multifactorial including dehydration, hypercalcemia, and progressive malignancy. Consider CT scan of head for further evaluation. 4. Leukocytosis: Likely reactive, monitor. 5. Disposition: Appreciate palliative care input. Have discussed hospice with the family, but they would like to wait 24-48 hours to see if patient's mental status recovers when his calcium levels normalize. They will then decide whether to pursue aggressive treatment or comfort care.   Lloyd Huger, MD   07/28/2016 4:04 PM

## 2016-07-28 NOTE — Progress Notes (Signed)
[place] at [place] NAME: Francisco Charles    MR#:  [id number]  DATE OF BIRTH:  [birth year]  SUBJECTIVE:  CHIEF COMPLAINT:  Patient is resting comfortably but not opening his eyes to verbal commands and moving spontaneously on the bed. Wife and sister are at bedside.  REVIEW OF SYSTEMS:  Review of systems unobtainable as the patient is with altered mental status  DRUG ALLERGIES:  No Known Allergies  VITALS:  Blood pressure (!) 174/91, pulse 86, temperature 97.7 F (36.5 C), temperature source Oral, resp. rate 18, height 6' (1.829 m), weight 68.7 kg (151 lb 8 oz), SpO2 95 %.  PHYSICAL EXAMINATION:  GENERAL:  65 y.o.-year-old patient lying in the bed with no acute distress.  EYES: Pupils equal, round, reactive to light and accommodation. No scleral icterus.  HEENT: Head atraumatic, normocephalic. Oropharynx and nasopharynx clear.  NECK:  Supple, no jugular venous distention. No thyroid enlargement, no tenderness.  LUNGS: Normal breath sounds bilaterally, no wheezing, rales,rhonchi or crepitation. No use of accessory muscles of respiration.  CARDIOVASCULAR: S1, S2 normal. No murmurs, rubs, or gallops.  ABDOMEN: Soft, nontender, nondistended. Bowel sounds present.  EXTREMITIES: No pedal edema, cyanosis, or clubbing.  NEUROLOGIC-patient is disoriented Psych-patient is disoriented  : Labs Lab 07/28/16 0524  WBC 14.6*  HGB 16.6  HCT 47.6  PLT 258   ------------------------------------------------------------------------------------------------------------------  Chemistries   Recent Labs Lab 07/27/16 0754 07/28/16 0524  NA 132* 137  K 4.2 3.6  CL 90* 97*  CO2 28 32  GLUCOSE 167* 94  BUN 44* 23*  CREATININE 1.38* 0.70  CALCIUM 12.4* 11.4*  AST 90*  --   ALT 171*  --   ALKPHOS 704*  --   BILITOT 4.7*  --     ------------------------------------------------------------------------------------------------------------------  Cardiac Enzymes  Recent Labs Lab 07/28/16 1124  TROPONINI 0.04*   ------------------------------------------------------------------------------------------------------------------  RADIOLOGY:  Ct Abdomen Pelvis W Contrast  Result Date: 07/27/2016 CLINICAL DATA:  Sepsis ; history sepsis, essential hypertension, prostate cancer, pancreatic mass of elevated CA 19 9 EXAM: CT ABDOMEN AND PELVIS WITH CONTRAST TECHNIQUE: Multidetector CT imaging of the abdomen and pelvis was performed using the standard protocol following bolus administration of intravenous contrast. Sagittal and coronal MPR images reconstructed from axial data set. CONTRAST:  157m ISOVUE-300 IOPAMIDOL (ISOVUE-300) INJECTION 61% IV. Dilute oral contrast. COMPARISON:  PET-CT 07/22/2016 FINDINGS: Lower chest:  Atelectasis and volume loss in BILATERAL lower lobes. Hepatobiliary: PTC drain traverses liver and CBD into duodenum. Mild intrahepatic biliary dilatation. Gallbladder contracted. 8 x 6 mm low-attenuation RIGHT lobe liver nodule again seen. Pancreas: Minimal prominence of pancreatic head though discrete mass is not visualized. This corresponds with the area of hypermetabolism on prior PET-CT. No definite pancreatic ductal dilatation or calcification. Spleen: Normal appearance Adrenals/Urinary Tract: Mild diffuse thickening of LEFT adrenal gland with question nodule medial limb 14 x 13 mm diameter image 34. RIGHT adrenal gland unremarkable. Small BILATERAL renal cysts without additional renal mass. No hydronephrosis, ureteral calcification or ureteral dilatation. Bladder unremarkable. Prostate gland/bed limited assessment due to beam hardening artifacts from LEFT hip prosthesis. Stomach/Bowel: Appendix filled with contrast. Few scattered distal colonic diverticula pre Large and small bowel loops otherwise unremarkable.  Stomach inadequately distended, unable to exclude areas of wall thickening. Vascular/Lymphatic: Scattered atherosclerotic calcifications. Aorta normal caliber. Peripancreatic nodes identified on prior PET-CT are less well visualized, LN 11 mm aortocaval node image 31 is noted, hypermetabolic on prior CT. Reproductive: N/A Other: Soft tissue nodule enhancing  margin at LEFT pericolic gutter 20 x 15 mm image 51, suspect peritoneal metastasis, was hypermetabolic on PET-CT. Question mild infiltration of transverse mesocolon see image 39 and small mesenteric. No free air or free fluid. Small umbilical hernia containing fat. Musculoskeletal: Numerous sclerotic foci throughout osseous structures compatible with widespread osseous metastatic disease. In addition there are areas of lytic lucency suspicious for lytic metastases within ribs and vertebra. LEFT hip prosthesis. IMPRESSION: Pancreatic head mass post PTC with minimal residual intrahepatic biliary dilatation. Probable peripancreatic nodal and LEFT paracolic gutter peritoneal metastases. Osseous metastases. Bibasilar atelectasis without definite lung base metastasis. Mild distal colonic diverticulosis. Small umbilical hernia. Probable LEFT adrenal nodule 14 x 13 mm, indeterminate. Question mild infiltrative changes within transverse mesocolon and small bowel mesenteric. Electronically Signed   By: Lavonia Dana M.D.   On: 07/27/2016 09:48   Dg Chest Port 1 View  Result Date: 07/27/2016 CLINICAL DATA:  Altered mental status, sepsis, hypertension, pancreatic lesion, smoking history EXAM: PORTABLE CHEST 1 VIEW COMPARISON:  PET-CT of 07/22/2016, and chest x-ray of 07/17/2013 FINDINGS: There is linear atelectasis or scarring at the right lung base. No infiltrate or effusion is seen. Mediastinal and hilar contours are unremarkable. The heart is within normal limits in size. A lytic lesion is noted involving the right posterolateral sixth rib. Also the PET-CT demonstrates  multiple bony metastases. IMPRESSION: 1. Linear atelectasis or scarring at the right lung base. No active infiltrate or effusion. 2. Lytic lesion involving the right posterolateral sixth rib. Electronically Signed   By: Ivar Drape M.D.   On: 07/27/2016 08:17    EKG:   Orders placed or performed during the hospital encounter of 07/27/16  . ED EKG 12-Lead  . ED EKG 12-Lead    ASSESSMENT AND PLAN:    *Altered mental status   Possibly due to dehydration, metastatic cancer and elevated bilirubin. CT head  ordered to rule out metastases given his altered mental status    not suspecting sepsis at this time  Blood and urine cultures are negative so far. Not considering antibiotics    continue IV fluids   * Acute renal insufficiency-prerenal from poor by mouth intake     improving with IV fluids.  Creatinine 1.38-0.70     * Metastatic pancreatic cancer-likely stage IV per oncology note - oncology consult is placed and patient was seen by Dr. Grayland Ormond.  -CT head is ordered to rule out brain metastases -Wife and sister wants to talk to Dr. Grayland Ormond regarding the cancer and progresses before they make further decisions and plan of care -If patient's performance status improves and desires aggressive treatment, oncology is considering CT-guided biopsy performed perhaps of lesion in his paracolic gutter. -Patient is followed with palliative care ,Awaiting feed back form Dr Marcelyn Ditty before making decision regarding a shift to full comfort and disposition to hospice facilty       * Malnutrition   Once he has improvement in mental status we can start feeding him regular food with nutritional supplements.  * Hypertension   Hold medications at this point because of his altered mental status   provide IV antihypertensives as needed  All the records are reviewed and case discussed with RN Management plans discussed with the patient, family and they are in agreement.     All the  records are reviewed and case discussed with Care Management/Social Workerr. Management plans discussed with the patient, family and they are in agreement.  CODE STATUS: DNR  TOTAL TIME TAKING CARE  OF THIS PATIENT: 39 minutes.   POSSIBLE D/C IN ? DAYS, DEPENDING ON CLINICAL CONDITION.  Note: This dictation was prepared with Dragon dictation along with smaller phrase technology. Any transcriptional errors that result from this process are unintentional.   Nicholes Mango M.D on 07/28/2016 at 3:07 PM  Between 7am to 6pm - Pager - [phone] After 6pm go to [email or web address] - password [email or web address] [facility]  Office  [phone]  CC: Primary care physician; Lelon Huh, MD

## 2016-07-28 NOTE — Progress Notes (Signed)
Pt went for  Ct scan this pm.  Dr Grayland Ormond saw pt.  Cont to sleep.  Responds some with vigorous  Shaking/opens eyes then drifts back to sleep

## 2016-07-29 ENCOUNTER — Inpatient Hospital Stay: Payer: No Typology Code available for payment source | Admitting: Oncology

## 2016-07-29 ENCOUNTER — Inpatient Hospital Stay: Payer: Self-pay | Admitting: Family Medicine

## 2016-07-29 ENCOUNTER — Inpatient Hospital Stay: Payer: No Typology Code available for payment source

## 2016-07-29 LAB — CBC
HCT: 45.8 % (ref 40.0–52.0)
Hemoglobin: 15.9 g/dL (ref 13.0–18.0)
MCH: 30.5 pg (ref 26.0–34.0)
MCHC: 34.7 g/dL (ref 32.0–36.0)
MCV: 87.9 fL (ref 80.0–100.0)
PLATELETS: 250 10*3/uL (ref 150–440)
RBC: 5.21 MIL/uL (ref 4.40–5.90)
RDW: 14.4 % (ref 11.5–14.5)
WBC: 14.7 10*3/uL — ABNORMAL HIGH (ref 3.8–10.6)

## 2016-07-29 LAB — BASIC METABOLIC PANEL
Anion gap: 7 (ref 5–15)
BUN: 22 mg/dL — AB (ref 6–20)
CALCIUM: 9.5 mg/dL (ref 8.9–10.3)
CO2: 28 mmol/L (ref 22–32)
CREATININE: 0.54 mg/dL — AB (ref 0.61–1.24)
Chloride: 101 mmol/L (ref 101–111)
GFR calc Af Amer: >60 mL/min (ref >60)
Glucose, Bld: 105 mg/dL — ABNORMAL HIGH (ref 65–99)
POTASSIUM: 3.9 mmol/L (ref 3.5–5.1)
SODIUM: 136 mmol/L (ref 135–145)

## 2016-07-29 NOTE — Progress Notes (Signed)
Pt more alert. Was able to take a few sips and bites. No c/o pain nor distress noted, spouse at bedside. Continue to monitor.

## 2016-07-29 NOTE — Progress Notes (Signed)
[place] at [place] NAME: Francisco Charles    MR#:  [id number]  DATE OF BIRTH:  04-21-1951  SUBJECTIVE:  CHIEF COMPLAINT:  Patient is resting comfortably but not opening his eyes to verbal commands and moving spontaneously on the bed. Wife and sister are at bedside.  REVIEW OF SYSTEMS:  Review of systems unobtainable as the patient is Not answering questions more alert than yesterday  DRUG ALLERGIES:  No Known Allergies  VITALS:  Blood pressure (!) 174/91, pulse 86, temperature 97.7 F (36.5 C), temperature source Oral, resp. rate 18, height 6' (1.829 m), weight 68.7 kg (151 lb 8 oz), SpO2 95 %.  PHYSICAL EXAMINATION:  GENERAL:  65 y.o.-year-old patient lying in the bed with no acute distress.  EYES: Pupils equal, round, reactive to light and accommodation. No scleral icterus.  HEENT: Head atraumatic, normocephalic. Oropharynx and nasopharynx clear.  NECK:  Supple, no jugular venous distention. No thyroid enlargement, no tenderness.  LUNGS: Normal breath sounds bilaterally, no wheezing, rales,rhonchi or crepitation. No use of accessory muscles of respiration.  CARDIOVASCULAR: S1, S2 normal. No murmurs, rubs, or gallops.  ABDOMEN: Soft, nontender, nondistended. Bowel sounds present.  EXTREMITIES: No pedal edema, cyanosis, or clubbing.  NEUROLOGIC-patient is disoriented Psych-patient is disoriented  : Labs Lab 07/28/16 0524  WBC 14.6*  HGB 16.6  HCT 47.6  PLT 258   ------------------------------------------------------------------------------------------------------------------  Chemistries   Recent Labs Lab 07/27/16 0754 07/28/16 0524  NA 132* 137  K 4.2 3.6  CL 90* 97*  CO2 28 32  GLUCOSE 167* 94  BUN 44* 23*  CREATININE 1.38* 0.70  CALCIUM 12.4* 11.4*  AST 90*  --   ALT 171*  --   ALKPHOS 704*  --   BILITOT 4.7*  --     ------------------------------------------------------------------------------------------------------------------  Cardiac Enzymes  Recent Labs Lab 07/28/16 1124  TROPONINI 0.04*   ------------------------------------------------------------------------------------------------------------------  RADIOLOGY:  Ct Abdomen Pelvis W Contrast  Result Date: 07/27/2016 CLINICAL DATA:  Sepsis ; history sepsis, essential hypertension, prostate cancer, pancreatic mass of elevated CA 19 9 EXAM: CT ABDOMEN AND PELVIS WITH CONTRAST TECHNIQUE: Multidetector CT imaging of the abdomen and pelvis was performed using the standard protocol following bolus administration of intravenous contrast. Sagittal and coronal MPR images reconstructed from axial data set. CONTRAST:  133m ISOVUE-300 IOPAMIDOL (ISOVUE-300) INJECTION 61% IV. Dilute oral contrast. COMPARISON:  PET-CT 07/22/2016 FINDINGS: Lower chest:  Atelectasis and volume loss in BILATERAL lower lobes. Hepatobiliary: PTC drain traverses liver and CBD into duodenum. Mild intrahepatic biliary dilatation. Gallbladder contracted. 8 x 6 mm low-attenuation RIGHT lobe liver nodule again seen. Pancreas: Minimal prominence of pancreatic head though discrete mass is not visualized. This corresponds with the area of hypermetabolism on prior PET-CT. No definite pancreatic ductal dilatation or calcification. Spleen: Normal appearance Adrenals/Urinary Tract: Mild diffuse thickening of LEFT adrenal gland with question nodule medial limb 14 x 13 mm diameter image 34. RIGHT adrenal gland unremarkable. Small BILATERAL renal cysts without additional renal mass. No hydronephrosis, ureteral calcification or ureteral dilatation. Bladder unremarkable. Prostate gland/bed limited assessment due to beam hardening artifacts from LEFT hip prosthesis. Stomach/Bowel: Appendix filled with contrast. Few scattered distal colonic diverticula pre Large and small bowel loops otherwise unremarkable.  Stomach inadequately distended, unable to exclude areas of wall thickening. Vascular/Lymphatic: Scattered atherosclerotic calcifications. Aorta normal caliber. Peripancreatic nodes identified on prior PET-CT are less well visualized, LN 11 mm aortocaval node image 31 is noted, hypermetabolic on prior CT. Reproductive: N/A Other: Soft  tissue nodule enhancing margin at LEFT pericolic gutter 20 x 15 mm image 51, suspect peritoneal metastasis, was hypermetabolic on PET-CT. Question mild infiltration of transverse mesocolon see image 39 and small mesenteric. No free air or free fluid. Small umbilical hernia containing fat. Musculoskeletal: Numerous sclerotic foci throughout osseous structures compatible with widespread osseous metastatic disease. In addition there are areas of lytic lucency suspicious for lytic metastases within ribs and vertebra. LEFT hip prosthesis. IMPRESSION: Pancreatic head mass post PTC with minimal residual intrahepatic biliary dilatation. Probable peripancreatic nodal and LEFT paracolic gutter peritoneal metastases. Osseous metastases. Bibasilar atelectasis without definite lung base metastasis. Mild distal colonic diverticulosis. Small umbilical hernia. Probable LEFT adrenal nodule 14 x 13 mm, indeterminate. Question mild infiltrative changes within transverse mesocolon and small bowel mesenteric. Electronically Signed   By: Lavonia Dana M.D.   On: 07/27/2016 09:48   Dg Chest Port 1 View  Result Date: 07/27/2016 CLINICAL DATA:  Altered mental status, sepsis, hypertension, pancreatic lesion, smoking history EXAM: PORTABLE CHEST 1 VIEW COMPARISON:  PET-CT of 07/22/2016, and chest x-ray of 07/17/2013 FINDINGS: There is linear atelectasis or scarring at the right lung base. No infiltrate or effusion is seen. Mediastinal and hilar contours are unremarkable. The heart is within normal limits in size. A lytic lesion is noted involving the right posterolateral sixth rib. Also the PET-CT demonstrates  multiple bony metastases. IMPRESSION: 1. Linear atelectasis or scarring at the right lung base. No active infiltrate or effusion. 2. Lytic lesion involving the right posterolateral sixth rib. Electronically Signed   By: Ivar Drape M.D.   On: 07/27/2016 08:17    EKG:   Orders placed or performed during the hospital encounter of 07/27/16  . ED EKG 12-Lead  . ED EKG 12-Lead    ASSESSMENT AND PLAN:    *Altered mental status   Possibly due to dehydration, metastatic cancer and elevated bilirubin. CT head  ordered to rule out metastases given his altered mental status    not suspecting sepsis at this time  Blood and urine cultures are negative so far. Not considering antibiotics    continue IV fluids   * Acute renal insufficiency-prerenal from poor by mouth intake     improving with IV fluids.  Creatinine 1.38-0.70 --0.54    * Metastatic pancreatic cancer-likely stage IV per oncology note - oncology consult is placed and patient was seen by Dr. Grayland Ormond.  -CT head is negative for brain metastases -Wife and sister wants to talk to Dr. Grayland Ormond regarding the cancer and progresses before they make further decisions and plan of care, discussed with Dr. Grayland Ormond he is aware and will talk to the family members today -If patient's performance status improves and desires aggressive treatment, oncology is considering CT-guided biopsy performed perhaps of lesion in his paracolic gutter. -Patient is followed with palliative care ,Awaiting feed back form Dr Marcelyn Ditty before making decision regarding a shift to full comfort and disposition to hospice facilty       * Malnutrition   Once he has improvement in mental status we can start feeding him regular food with nutritional supplements.  * Hypertension   Hold medications at this point because of his altered mental status   provide IV antihypertensives as needed  All the records are reviewed and case discussed with RN Management plans  discussed with the patient, family and they are in agreement.     All the records are reviewed and case discussed with Care Management/Social Workerr. Management plans discussed with the  patient, family and they are in agreement.  CODE STATUS: DNR  TOTAL TIME TAKING CARE OF THIS PATIENT: 39 minutes.   POSSIBLE D/C IN ? DAYS, DEPENDING ON CLINICAL CONDITION.  Note: This dictation was prepared with Dragon dictation along with smaller phrase technology. Any transcriptional errors that result from this process are unintentional.   Nicholes Mango M.D on 07/28/2016 at 3:07 PM  Between 7am to 6pm - Pager - [phone] After 6pm go to [email or web address] - password [email or web address] [facility]  Office  [phone]  CC: Primary care physician; Lelon Huh, MD

## 2016-07-30 ENCOUNTER — Encounter: Payer: Self-pay | Admitting: Interventional Radiology

## 2016-07-30 ENCOUNTER — Inpatient Hospital Stay: Payer: No Typology Code available for payment source

## 2016-07-30 HISTORY — PX: IR GENERIC HISTORICAL: IMG1180011

## 2016-07-30 MED ORDER — FENTANYL CITRATE (PF) 100 MCG/2ML IJ SOLN
INTRAMUSCULAR | Status: AC
  Filled 2016-07-30: qty 2

## 2016-07-30 MED ORDER — IOPAMIDOL (ISOVUE-300) INJECTION 61%: 10.0000 mL | Freq: Once | INTRAVENOUS | Status: DC | PRN

## 2016-07-30 MED ORDER — FENTANYL CITRATE (PF) 100 MCG/2ML IJ SOLN
INTRAMUSCULAR | Status: AC | PRN
  Administered 2016-07-30: 50 ug via INTRAVENOUS

## 2016-07-30 NOTE — Care Management (Signed)
Dr. Leslye Peer discussed discharge plans with Mr & Ms Argenti at the bedside. Mr & Ms Figgers decided that they would like to go home with Hospice services in the home. No treatment for pancreatic mass.  Discussed hospice agencies at the bedside. Ms. Vasconez indicated that she would like [facility] because her mother had been at the [facility]. Would like a hospital bed in the home and transportation per [facility]. States that she has a wheelchair in the home from where her husband had hip surgery. Discharge to home 07/31/16 per Dr. Leslye Peer. Shelbie Ammons RN MSN CCM Care Management [phone]

## 2016-07-30 NOTE — Progress Notes (Signed)
Patient ID: Francisco Charles, male   DOB: June 23, 1951, 65 y.o.   MRN: [id number]  ACP planning.  Wife at the bedside. Care manager at the bedside. Patient present with discussion.  Diagnosis stage IV metastatic pancreatic cancer. Overall prognosis poor.  Hospice care to be set up at home including hospital bed.  17 minutes spent with this discussion. It took a while to explain this to the patient for him to understand. Still not quite sure if he understood completely. Wife understands prognosis.  Dr. Loletha Grayer

## 2016-07-30 NOTE — Procedures (Signed)
Successful exchange and upsizing of now 12 Fr biliary drainage catheter with end coiled and locked within the duodenum.  Biliary drain connected to gravity bag. EBL: None No immediate post procedural complications.  Ronny Bacon, MD Pager #: [phone]

## 2016-07-30 NOTE — Progress Notes (Signed)
[name]  [phone] [phone]  ID: Francisco Charles OB: 07-May-1951  MR#: [id number]  BSW#:[id number]  Patient Care Team: Birdie Sons, MD as PCP - General (Family Medicine) Murrell Redden, MD as Consulting Physician (Urology) Manya Silvas, MD (Gastroenterology) Clent Jacks, RN as Registered Nurse  CHIEF COMPLAINT: Altered mental status, likely stage IV pancreatic cancer, hypercalcemia  INTERVAL HISTORY: Patient still with confusion and altered mental status with minimal improvement. Wife remains at bedside.  Review of systems is still unobtainable.  REVIEW OF SYSTEMS:   Review of Systems  Unable to perform ROS: Mental status change    As per HPI. Otherwise, a complete review of systems is negative.  PAST MEDICAL HISTORY: Past Medical History:  Diagnosis Date  . Cancer [facility])    pancreatic cancer- Aug 2017  . Carpal tunnel syndrome   . History of measles   . Hyperplastic colonic polyp 2006   Excised Dr. Tiffany Kocher  . Hypertension   . Osteoarthrosis   . Prostate nodule     PAST SURGICAL HISTORY: Past Surgical History:  Procedure Laterality Date  . ERCP N/A 07/16/2016   Procedure: ENDOSCOPIC RETROGRADE CHOLANGIOPANCREATOGRAPHY (ERCP);  Surgeon: Carol Ada, MD;  Location: [facility];  Service: Endoscopy;  Laterality: N/A;  . EUS Left 07/19/2016   Procedure: UPPER ENDOSCOPIC ULTRASOUND (EUS) LINEAR;  Surgeon: Carol Ada, MD;  Location: [facility] ENDOSCOPY;  Service: Endoscopy;  Laterality: Left;  . IR GENERIC HISTORICAL  07/17/2016   IR INT EXT BILIARY DRAIN WITH CHOLANGIOGRAM 07/17/2016 Greggory Keen, MD [facility]-INTERV RAD  . JOINT REPLACEMENT Left 10-31-2000  . PROSTATE SURGERY  09/23/2011   Prostate Biopsy Dr. Jacqlyn Larsen  . TOTAL HIP ARTHROPLASTY Left 2001  . TOTAL HIP REVISION Left 07/23/2013   Procedure: LEFT TOTAL HIP REVISION ARTHROPLASTY WITH BONE GRAFT;  Surgeon: Mauri Pole, MD;  Location: [facility] ORS;  Service: Orthopedics;   Laterality: Left;    FAMILY HISTORY: Family History  Problem Relation Age of Onset  . Heart attack Mother   . Cancer Mother   . Heart attack Brother   . Diabetes Neg Hx   . Heart disease Neg Hx        ADVANCED DIRECTIVES (Y/N):  @ADVDIR @   HEALTH MAINTENANCE: Social History  Substance Use Topics  . Smoking status: Current Every Day Smoker    Packs/day: 0.50    Years: 30.00    Types: Cigarettes    Last attempt to quit: 11/29/2012  . Smokeless tobacco: Never Used  . Alcohol use No     Colonoscopy:  PAP:  Bone density:  Lipid panel:  No Known Allergies  Current Facility-Administered Medications  Medication Dose Route Frequency Provider Last Rate Last Dose  . bisacodyl (DULCOLAX) suppository 10 mg  10 mg Rectal Daily Nicholes Mango, MD   10 mg at 07/28/16 1523  . feeding supplement (ENSURE ENLIVE) (ENSURE ENLIVE) liquid 237 mL  237 mL Oral TID BM Nicholes Mango, MD   237 mL at 07/29/16 2157  . heparin injection 5,000 Units  5,000 Units Subcutaneous Q8H Vaughan Basta, MD   5,000 Units at 07/30/16 4665  . methocarbamol (ROBAXIN) tablet 750 mg  750 mg Oral Q6H PRN Vaughan Basta, MD      . metoprolol (LOPRESSOR) injection 5 mg  5 mg Intravenous Q4H PRN Nicholes Mango, MD      . morphine 2 MG/ML injection 2 mg  2 mg Intravenous Q2H PRN Knox Royalty, NP      .  pantoprazole (PROTONIX) EC tablet 40 mg  40 mg Oral Daily Vaughan Basta, MD   40 mg at 07/30/16 1019  . traMADol (ULTRAM) tablet 100 mg  100 mg Oral Q6H PRN Vaughan Basta, MD        OBJECTIVE: Vitals:   07/30/16 1016 07/30/16 1210  BP: 139/87 (!) 153/90  Pulse: 80 95  Resp: 20 16  Temp: 98 F (36.7 C) 98.1 F (36.7 C)     Body mass index is 20.55 kg/m.    ECOG FS:4 - Bedbound  General: Ill-appearing, no acute distress. Eyes: Pink conjunctiva, anicteric sclera. Lungs: Clear to auscultation bilaterally. Heart: Regular rate and rhythm. No rubs, murmurs, or gallops. Abdomen: Soft,  nontender, nondistended. Percutaneous bile duct tube noted.. Musculoskeletal: No edema, cyanosis, or clubbing. Neuro: Altered mental status. Lethargic. Skin: No rashes or petechiae noted.   LAB RESULTS:  Lab Results  Component Value Date   NA 136 07/29/2016   K 3.9 07/29/2016   CL 101 07/29/2016   CO2 28 07/29/2016   GLUCOSE 105 (H) 07/29/2016   BUN 22 (H) 07/29/2016   CREATININE 0.54 (L) 07/29/2016   CALCIUM 9.5 07/29/2016   PROT 8.0 07/27/2016   ALBUMIN 3.6 07/27/2016   AST 90 (H) 07/27/2016   ALT 171 (H) 07/27/2016   ALKPHOS 704 (H) 07/27/2016   BILITOT 4.7 (H) 07/27/2016   GFRNONAA >60 07/29/2016   GFRAA >60 07/29/2016    Lab Results  Component Value Date   WBC 14.7 (H) 07/29/2016   NEUTROABS 17.6 (H) 07/27/2016   HGB 15.9 07/29/2016   HCT 45.8 07/29/2016   MCV 87.9 07/29/2016   PLT 250 07/29/2016     STUDIES: Ct Head Wo Contrast  Result Date: 07/28/2016 CLINICAL DATA:  history of hypertension, arthritis, Pancreatic mass status post percutaneous biliary drain placed on 07/18/2016 who presents for evaluation of generalized weakness and altered mental status. Wife apparently called EMS because the patient has been increasingly weak over the past 2 days. EXAM: CT HEAD WITHOUT CONTRAST TECHNIQUE: Contiguous axial images were obtained from the base of the skull through the vertex without intravenous contrast. COMPARISON:  None. FINDINGS: Brain: The ventricles are normal in configuration. There is ventricular and sulcal enlargement reflecting mild atrophy. There are no parenchymal masses or mass effect. There is no evidence of an infarct. Mild periventricular white matter hypoattenuation is noted consistent with chronic microvascular ischemic change. There are no extra-axial masses or abnormal fluid collections. There is no intracranial hemorrhage. Vascular: No hyperdense vessel or unexpected calcification. Skull: No skull lesion. Sinuses/Orbits: Visualize globes and orbits are  unremarkable. Visualized sinuses are clear. Other: Clear mastoid air cells IMPRESSION: 1. No acute intracranial abnormalities. 2. Mild atrophy and chronic microvascular ischemic change. Electronically Signed   By: Lajean Manes M.D.   On: 07/28/2016 16:03   Ct Abdomen Pelvis W Contrast  Result Date: 07/27/2016 CLINICAL DATA:  Sepsis ; history sepsis, essential hypertension, prostate cancer, pancreatic mass of elevated CA 19 9 EXAM: CT ABDOMEN AND PELVIS WITH CONTRAST TECHNIQUE: Multidetector CT imaging of the abdomen and pelvis was performed using the standard protocol following bolus administration of intravenous contrast. Sagittal and coronal MPR images reconstructed from axial data set. CONTRAST:  179m ISOVUE-300 IOPAMIDOL (ISOVUE-300) INJECTION 61% IV. Dilute oral contrast. COMPARISON:  PET-CT 07/22/2016 FINDINGS: Lower chest:  Atelectasis and volume loss in BILATERAL lower lobes. Hepatobiliary: PTC drain traverses liver and CBD into duodenum. Mild intrahepatic biliary dilatation. Gallbladder contracted. 8 x 6 mm low-attenuation RIGHT lobe liver  nodule again seen. Pancreas: Minimal prominence of pancreatic head though discrete mass is not visualized. This corresponds with the area of hypermetabolism on prior PET-CT. No definite pancreatic ductal dilatation or calcification. Spleen: Normal appearance Adrenals/Urinary Tract: Mild diffuse thickening of LEFT adrenal gland with question nodule medial limb 14 x 13 mm diameter image 34. RIGHT adrenal gland unremarkable. Small BILATERAL renal cysts without additional renal mass. No hydronephrosis, ureteral calcification or ureteral dilatation. Bladder unremarkable. Prostate gland/bed limited assessment due to beam hardening artifacts from LEFT hip prosthesis. Stomach/Bowel: Appendix filled with contrast. Few scattered distal colonic diverticula pre Large and small bowel loops otherwise unremarkable. Stomach inadequately distended, unable to exclude areas of wall  thickening. Vascular/Lymphatic: Scattered atherosclerotic calcifications. Aorta normal caliber. Peripancreatic nodes identified on prior PET-CT are less well visualized, LN 11 mm aortocaval node image 31 is noted, hypermetabolic on prior CT. Reproductive: N/A Other: Soft tissue nodule enhancing margin at LEFT pericolic gutter 20 x 15 mm image 51, suspect peritoneal metastasis, was hypermetabolic on PET-CT. Question mild infiltration of transverse mesocolon see image 39 and small mesenteric. No free air or free fluid. Small umbilical hernia containing fat. Musculoskeletal: Numerous sclerotic foci throughout osseous structures compatible with widespread osseous metastatic disease. In addition there are areas of lytic lucency suspicious for lytic metastases within ribs and vertebra. LEFT hip prosthesis. IMPRESSION: Pancreatic head mass post PTC with minimal residual intrahepatic biliary dilatation. Probable peripancreatic nodal and LEFT paracolic gutter peritoneal metastases. Osseous metastases. Bibasilar atelectasis without definite lung base metastasis. Mild distal colonic diverticulosis. Small umbilical hernia. Probable LEFT adrenal nodule 14 x 13 mm, indeterminate. Question mild infiltrative changes within transverse mesocolon and small bowel mesenteric. Electronically Signed   By: Lavonia Dana M.D.   On: 07/27/2016 09:48   Ct Abdomen Pelvis W Contrast  Result Date: 07/13/2016 CLINICAL DATA:  65 year old male with right abdominal pain, most prominent in the right lower quadrant. Elevated liver function tests. Postprandial nausea. History of prostate biopsy. EXAM: CT ABDOMEN AND PELVIS WITH CONTRAST TECHNIQUE: Multidetector CT imaging of the abdomen and pelvis was performed using the standard protocol following bolus administration of intravenous contrast. CONTRAST:  126m ISOVUE-300 IOPAMIDOL (ISOVUE-300) INJECTION 61% COMPARISON:  09/29/2012 bone protocol CT pelvis. No prior CT abdomen/pelvis. FINDINGS: Lower  chest: No significant pulmonary nodules or acute consolidative airspace disease. Hepatobiliary: Normal liver size. Simple 0.8 cm liver cyst in segment 8 of the right liver lobe. No additional discrete liver lesions. There is slightly irregular annular wall thickening and mucosal hyperenhancement in the fundal gallbladder wall, with loss of the normal fat plane between the fundal gallbladder wall and liver (series 2/ image 32). No radiopaque cholelithiasis. No significant gallbladder distention. No pericholecystic fluid. There is moderate diffuse intrahepatic biliary ductal dilatation. There is a focal stricture in the upper third of the common bile duct with associated common bile duct wall thickening and hyperenhancement (best seen on coronal series 5/ image 46). Pancreas: No pancreatic duct dilation. There is a heterogeneous hypodense 2.1 x 1.5 cm mass at the posterior superior margin of the pancreatic neck (series 2/ image 29), which demonstrates ill-defined margins with infiltrative soft tissue extending into the porta hepatis along the common bile duct. Spleen: Normal size. No mass. Adrenals/Urinary Tract: There is irregular thickening of the bilateral adrenal glands, with the suggestion of small adrenal nodules bilaterally measuring 1.0 cm on the right (series 2/ image 24) and 1.3 cm on the left (series 2/ image 25). No hydronephrosis. Simple 2.0 cm interpolar left renal cyst.  Simple 1.0 cm lower right renal cyst. No additional renal lesions. Limited visualization of the collapsed bladder due to streak artifact from left hip hardware with no gross bladder abnormality . Stomach/Bowel: Grossly normal stomach. Normal caliber small bowel with no small bowel wall thickening. Normal appendix . Mild sigmoid diverticulosis, with no large bowel wall thickening or pericolonic fat stranding. Vascular/Lymphatic: Atherosclerotic nonaneurysmal abdominal aorta. Patent portal, splenic, hepatic and renal veins. No  pathologically enlarged lymph nodes in the abdomen or pelvis. Reproductive: Poorly visualized prostate due to streak artifact from the left hip hardware. Prostate appears mildly enlarged. Other: No pneumoperitoneum. No ascites. No focal fluid collection. There is a heterogeneously enhancing 1.5 x 1.4 cm left paracolic gutter peritoneal nodule (series 2/image 44). Musculoskeletal: There is widespread patchy sclerosis throughout the visualized thoracolumbar spine, sacrum, bilateral iliac bones and lower ribs bilaterally. Mild thoracolumbar spondylosis. Partially visualized left total hip arthroplasty. IMPRESSION: 1. Moderate diffuse intrahepatic biliary ductal dilatation. 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 2. Hypodense 2.1 cm mass at the posterior superior margin of the pancreatic neck with associated infiltrative soft tissue extending into the porta hepatis. No main pancreatic duct dilation. This mass could represent a primary pancreatic adenocarcinoma or malignant peripancreatic lymph node. 3. Irregular annular wall thickening and hyperenhancement in the fundal gallbladder with loss of the normal fat plane between the fundal gallbladder and liver. Cannot exclude gallbladder carcinoma or serosal carcinomatosis in this location. 4. Left paracolic gutter peritoneal nodule suspicious for peritoneal carcinomatosis. 5. Suggestion of small bilateral adrenal nodules, cannot exclude adrenal metastases. 6. MRI of the abdomen with MRCP without and with IV contrast may be useful for further evaluation of the above findings. 7. 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 Recommend correlation with serum PSA. 8. Additional findings include aortic atherosclerosis and mild sigmoid diverticulosis. Electronically Signed   By: Ilona Sorrel M.D.   On: 07/13/2016 18:21   Mr 3d Recon At Scanner  Result Date:  07/14/2016 CLINICAL DATA:  Common bile duct stricture. EXAM: MRI ABDOMEN WITHOUT AND WITH CONTRAST (INCLUDING MRCP) TECHNIQUE: Multiplanar multisequence MR imaging of the abdomen was performed both before and after the administration of intravenous contrast. Heavily T2-weighted images of the biliary and pancreatic ducts were obtained, and three-dimensional MRCP images were rendered by post processing. CONTRAST:  19m MULTIHANCE GADOBENATE DIMEGLUMINE 529 MG/ML IV SOLN COMPARISON:  CT 07/13/2016 has high FINDINGS: Lower chest:  Lung bases are clear. Hepatobiliary: There is intrahepatic and extrahepatic biliary duct dilatation. Stricturing of the bile duct at the junction of the common hepatic duct and common bile duct (image 12, series 9). More distal stricturing of the common hepatic duct (image 13, series 9). Both the strictures occur over approximately 1 cm segment. The common bile duct appears normal caliber at the ampullary level. Within the hepatic parenchyma. The bile ducts have a mild beaded appearance. There is suggestion of sludge within the bile ducts of the RIGHT hepatic lobe (image 17, series 3 image 16 series 3). There is a cholesterol gallstone within the fundus of the gallbladder seen on the precontrast T1 weighted imaging (series 14). Pancreas: Along the ventral aspect of the pancreatic head there is a lesion which is either of the pancreas or adjacent the pancreas which is hypointense to normal pancreatic parenchyma measuring 20 mm image 35, series 4. This lesion is hypo enhancing on the arterial phase (image 34, series 15). Favor lesion to be and within  the pancreatic parenchyma. There is no significant pancreatic duct dilatation. Spleen: Normal spleen. Adrenals/urinary tract: Thickening and adrenal glands which is incompletely characterized. Stomach/Bowel: Stomach and limited of the small bowel is unremarkable Vascular/Lymphatic: Abdominal aortic normal caliber. No retroperitoneal periportal  lymphadenopathy. Musculoskeletal: Heterogeneous marrow signal within the entirety of the spine (image 19, series 189) para IMPRESSION: 1. Intrahepatic and extrahepatic duct dilatation with segments of bile duct stricturing in the common hepatic duct and common bile duct. Additionally there is mild beaded appearance of the intrahepatic ducts and cholestasis in the RIGHT hepatic lobe. Differential considerations include cholangiocarcinoma of the extrahepatic ducts, occult malignant stricturing of the extrahepatic ducts and primary sclerosing cholangitis with or without associated cholangiocarcinoma. 2. Lesion within or adjacent to the pancreatic head concerning for pancreatic neoplasm versus a malignant lymph node. Recommend endoscopic ultrasound with tissue sampling. Recommend tissue sampling of the extrahepatic ducts at same time. 3. Cholesterol gallstone within fundus of the gallbladder. 4. 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 Electronically Signed   By: Suzy Bouchard M.D.   On: 07/14/2016 11:21   Nm Pet Image Initial (pi) Skull Base To Thigh  Result Date: 07/22/2016 CLINICAL DATA:  Initial treatment strategy for pancreatic mass. Elevated CA 19-9 level. Personal history of prostate carcinoma. EXAM: NUCLEAR MEDICINE PET SKULL BASE TO THIGH TECHNIQUE: 13.1 mCi F-18 FDG was injected intravenously. Full-ring PET imaging was performed from the skull base to thigh after the radiotracer. CT data was obtained and used for attenuation correction and anatomic localization. FASTING BLOOD GLUCOSE:  Value: 139 mg/dl COMPARISON:  MRI on 07/14/2016 and CT on 07/13/2016 FINDINGS: NECK No hypermetabolic lymph nodes in the neck. CHEST 8 mm mediastinal lymph node in right paratracheal region is hypermetabolic with SUV max of 5.1. An 8 mm subcarinal mediastinal lymph node is also seen with low-grade metabolic activity with SUV max of 3.2. No suspicious pulmonary nodules seen by  CT. Mild dependent atelectasis seen in both lung bases. ABDOMEN/PELVIS Internal and external biliary drainage catheter has been placed since prior study with resolution of biliary ductal dilatation since previous study. Focal hypermetabolism is seen in the area the pancreatic head surrounding the distal common bile duct stent, which has SUV max of 9.8. This is highly suspicious for primary pancreatic carcinoma. Small hypermetabolic peripancreatic lymph nodes are seen along the posterior aspect of the pancreatic head. Although difficult to visualized on noncontrast CT, This measures 9 mm approximately on image 159/3 and has SUV max of 8.1. Small hypermetabolic lymph nodes are seen in the porta hepatis, with index lymph node measuring 10 mm on image 141/3, with SUV max of 4.0. Mild hypermetabolic retroperitoneal lymphadenopathy seen in the aortocaval space with SUV max of 8.4. An 8 mm left retrocrural lymph node on image 127/5 is hypermetabolic, with SUV max of 8.2. Several small foci of hyper metabolism are seen in the liver dome in near the junction of the right and caudate lobes which have SUV max ranging from 3-4. No definite corresponding liver lesions are seen by CT, however liver metastases cannot be excluded. A soft tissue nodule in the left paracolic gutter on image 170/YFVCBS 3 is hypermetabolic, with SUV max of 8.3. This is suspicious for peritoneal metastasis. SKELETON Hypermetabolic sclerotic bone metastases are seen throughout the neck, chest, abdomen, pelvis, proximal humeri, and proximal femora. IMPRESSION: Focal hyper metabolism in the pancreatic head surrounding the distal common bile duct stent, highly suspicious for primary pancreatic carcinoma. Mild hypermetabolic lymphadenopathy the peripancreatic  region, porta hepatis, aortocaval space and left retrocrural region, consistent with lymph node metastases. Sub-cm hypermetabolic mediastinal lymph nodes, suspicious for lymph node metastases. Several  small hypermetabolic foci within the liver. Liver metastases cannot be excluded. Hypermetabolic soft tissue nodule in the left paracolic gutter, suspicious for peritoneal metastasis. Diffuse hypermetabolic sclerotic bone metastases. Electronically Signed   By: Earle Gell M.D.   On: 07/22/2016 12:39   Dg Chest Port 1 View  Result Date: 07/27/2016 CLINICAL DATA:  Altered mental status, sepsis, hypertension, pancreatic lesion, smoking history EXAM: PORTABLE CHEST 1 VIEW COMPARISON:  PET-CT of 07/22/2016, and chest x-ray of 07/17/2013 FINDINGS: There is linear atelectasis or scarring at the right lung base. No infiltrate or effusion is seen. Mediastinal and hilar contours are unremarkable. The heart is within normal limits in size. A lytic lesion is noted involving the right posterolateral sixth rib. Also the PET-CT demonstrates multiple bony metastases. IMPRESSION: 1. Linear atelectasis or scarring at the right lung base. No active infiltrate or effusion. 2. Lytic lesion involving the right posterolateral sixth rib. Electronically Signed   By: Ivar Drape M.D.   On: 07/27/2016 08:17   Dg C-arm 61-120 Min-no Report  Result Date: 07/16/2016 CLINICAL DATA: stricture  C-ARM 61-120 MINUTES Fluoroscopy was utilized by the requesting physician.  No radiographic interpretation.   Mr Abd W/wo Cm/mrcp  Result Date: 07/14/2016 CLINICAL DATA:  Common bile duct stricture. EXAM: MRI ABDOMEN WITHOUT AND WITH CONTRAST (INCLUDING MRCP) TECHNIQUE: Multiplanar multisequence MR imaging of the abdomen was performed both before and after the administration of intravenous contrast. Heavily T2-weighted images of the biliary and pancreatic ducts were obtained, and three-dimensional MRCP images were rendered by post processing. CONTRAST:  44m MULTIHANCE GADOBENATE DIMEGLUMINE 529 MG/ML IV SOLN COMPARISON:  CT 07/13/2016 has high FINDINGS: Lower chest:  Lung bases are clear. Hepatobiliary: There is intrahepatic and extrahepatic  biliary duct dilatation. Stricturing of the bile duct at the junction of the common hepatic duct and common bile duct (image 12, series 9). More distal stricturing of the common hepatic duct (image 13, series 9). Both the strictures occur over approximately 1 cm segment. The common bile duct appears normal caliber at the ampullary level. Within the hepatic parenchyma. The bile ducts have a mild beaded appearance. There is suggestion of sludge within the bile ducts of the RIGHT hepatic lobe (image 17, series 3 image 16 series 3). There is a cholesterol gallstone within the fundus of the gallbladder seen on the precontrast T1 weighted imaging (series 14). Pancreas: Along the ventral aspect of the pancreatic head there is a lesion which is either of the pancreas or adjacent the pancreas which is hypointense to normal pancreatic parenchyma measuring 20 mm image 35, series 4. This lesion is hypo enhancing on the arterial phase (image 34, series 15). Favor lesion to be and within the pancreatic parenchyma. There is no significant pancreatic duct dilatation. Spleen: Normal spleen. Adrenals/urinary tract: Thickening and adrenal glands which is incompletely characterized. Stomach/Bowel: Stomach and limited of the small bowel is unremarkable Vascular/Lymphatic: Abdominal aortic normal caliber. No retroperitoneal periportal lymphadenopathy. Musculoskeletal: Heterogeneous marrow signal within the entirety of the spine (image 19, series 189) para IMPRESSION: 1. Intrahepatic and extrahepatic duct dilatation with segments of bile duct stricturing in the common hepatic duct and common bile duct. Additionally there is mild beaded appearance of the intrahepatic ducts and cholestasis in the RIGHT hepatic lobe. Differential considerations include cholangiocarcinoma of the extrahepatic ducts, occult malignant stricturing of the extrahepatic ducts and primary  sclerosing cholangitis with or without associated cholangiocarcinoma. 2. Lesion  within or adjacent to the pancreatic head concerning for pancreatic neoplasm versus a malignant lymph node. Recommend endoscopic ultrasound with tissue sampling. Recommend tissue sampling of the extrahepatic ducts at same time. 3. Cholesterol gallstone within fundus of the gallbladder. 4. 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 Electronically Signed   By: Suzy Bouchard M.D.   On: 07/14/2016 11:21   Ir Int Lianne Cure Biliary Drain With Cholangiogram  Result Date: 07/17/2016 INDICATION: BILIARY OBSTRUCTION, OBSTRUCTIVE JAUNDICE, UNSUCCESSFUL ERCP STENT INSERTION EXAM: IR INT-EXT BILIARY DRAIN W/ CHOLANGIOGRAM MEDICATIONS: 3.375 g Zosyn; The antibiotic was administered within an appropriate time frame prior to the initiation of the procedure. ANESTHESIA/SEDATION: Moderate (conscious) sedation was employed during this procedure. A total of Versed 2.0 mg and Fentanyl 200 mcg was administered intravenously. Moderate Sedation Time: 30 minutes. The patient's level of consciousness and vital signs were monitored continuously by radiology nursing throughout the procedure under my direct supervision. FLUOROSCOPY TIME:  Fluoroscopy Time: 3 minutes 24 seconds (73 mGy). COMPLICATIONS: None immediate. PROCEDURE: Informed written consent was obtained from the patient after a thorough discussion of the procedural risks, benefits and alternatives. All questions were addressed. Maximal Sterile Barrier Technique was utilized including caps, mask, sterile gowns, sterile gloves, sterile drape, hand hygiene and skin antiseptic. A timeout was performed prior to the initiation of the procedure. Previous imaging reviewed. Under sterile conditions and local anesthesia, ultrasound percutaneous needle access performed of a peripheral right hepatic duct in the mid axillary line through a lower intercostal space. There was return of bile. Contrast injection performed for a cholangiogram.  Cholangiogram confirms diffuse biliary obstruction and marked dilatation. 018 guidewire advanced followed by the Accustick dilator set. Outer dilator advanced into the distal CBD. Contrast injection confirms position. Amplatz guidewire advanced into the duodenum. Tract dilatation performed to advance a 10 [place] internal external biliary drain. Retention loop formed in the duodenum. Peripheral marker within the right hepatic duct. Contrast injection confirms drainage of the right and left ducts adequately. Gravity drainage bag connected. Catheter secured with a Prolene suture and a sterile dressing. No immediate complication. Patient tolerated the procedure well. IMPRESSION: Successful ultrasound and fluoroscopic 10 French right internal external biliary drain. Electronically Signed   By: Jerilynn Mages.  Shick M.D.   On: 07/17/2016 18:27    ASSESSMENT: Altered mental status, likely stage IV pancreatic cancer, hypercalcemia  PLAN:    1. Likely stage IV pancreatic cancer: Patient had ERCP as well as percutaneous cannulation of his bile duct system. Despite the high suspicion of malignancy, biopsy and cytology were negative. After lengthy discussion with patient's wife and family, they expressed understanding that there are no treatment options at this time given his altered mental status and decreased performance status. They have agreed for discharge and take the patient home on hospice. Appreciate palliative care input.  2. Hypercalcemia: Resolved with IV Zometa and fluid resuscitation. Despite a normal calcium level, patient's mental status has not improved. Home on hospice as above.  3. Altered metal status: CT scan of the head is negative. No improvement in patient's status.  4. Leukocytosis: Likely reactive, monitor. 5. Disposition: Discharge on to home on hospice as above.  Appreciate consult, call with questions.  Patient is DNR/DNI.   Lloyd Huger, MD   07/30/2016 12:50 PM

## 2016-07-30 NOTE — Plan of Care (Signed)
Problem: Nutrition: Goal: Adequate nutrition will be maintained Outcome: Progressing Remains on Supplemental Nutrition.

## 2016-07-30 NOTE — Progress Notes (Signed)
Patient ID: Francisco Charles, male   DOB: [birth year], 65 y.o.   MRN: [id number]  [facility] PROGRESS NOTE  Francisco Charles [id number] DOB: May 12, 1951 DOA: 07/27/2016 PCP: Lelon Huh, MD  HPI/Subjective: Patient was seen earlier by me. I came back this afternoon to speak with the wife at the bedside. They had a long discussion with Dr. Grayland Ormond last night. They would like to take him home with hospice care. Family is concerned that the tube has a lot of leakage around the tube.  Objective: Vitals:   07/30/16 1016 07/30/16 1210  BP: 139/87 (!) 153/90  Pulse: 80 95  Resp: 20 16  Temp: 98 F (36.7 C) 98.1 F (36.7 C)    Filed Weights   07/27/16 0804  Weight: 68.7 kg (151 lb 8 oz)    ROS: Review of Systems  Unable to perform ROS: Acuity of condition  Respiratory: Negative for cough and shortness of breath.   Cardiovascular: Negative for chest pain.  Gastrointestinal: Negative for abdominal pain.  Genitourinary: Negative for dysuria.  Musculoskeletal: Negative for joint pain.   Limited review of systems with confusion. Exam: Physical Exam  HENT:  Nose: No mucosal edema.  Mouth/Throat: No oropharyngeal exudate or posterior oropharyngeal edema.  Eyes: Conjunctivae and lids are normal. Pupils are equal, round, and reactive to light.  Neck: No JVD present. Carotid bruit is not present. No edema present. No thyroid mass and no thyromegaly present.  Cardiovascular: S1 normal and S2 normal.  Exam reveals no gallop.   No murmur heard. Pulses:      Dorsalis pedis pulses are 2+ on the right side, and 2+ on the left side.  Respiratory: No respiratory distress. He has no wheezes. He has no rhonchi. He has no rales.  GI: Soft. Bowel sounds are normal. There is no tenderness.  The tube with fluid leaking around the tube at the insertion.  Musculoskeletal:       Right ankle: He exhibits no swelling.       Left ankle: He exhibits no swelling.  Lymphadenopathy:    He has no  cervical adenopathy.  Neurological: He is alert.  Skin: Skin is warm. No rash noted. Nails show no clubbing.  Psychiatric: He has a normal mood and affect.      Data Reviewed: Basic Metabolic Panel:  Recent Labs Lab 07/27/16 0754 07/28/16 0524 07/29/16 0422  NA 132* 137 136  K 4.2 3.6 3.9  CL 90* 97* 101  CO2 28 32 28  GLUCOSE 167* 94 105*  BUN 44* 23* 22*  CREATININE 1.38* 0.70 0.54*  CALCIUM 12.4* 11.4* 9.5   Liver Function Tests:  Recent Labs Lab 07/27/16 0754  AST 90*  ALT 171*  ALKPHOS 704*  BILITOT 4.7*  PROT 8.0  ALBUMIN 3.6    Recent Labs Lab 07/27/16 0754  LIPASE 41    Recent Labs Lab 07/27/16 0747  AMMONIA 31   CBC:  Recent Labs Lab 07/27/16 0754 07/28/16 0524 07/29/16 0422  WBC 19.9* 14.6* 14.7*  NEUTROABS 17.6*  --   --   HGB 18.1* 16.6 15.9  HCT 53.6* 47.6 45.8  MCV 88.9 88.2 87.9  PLT 301 258 250   Cardiac Enzymes:  Recent Labs Lab 07/27/16 0754 07/28/16 0524 07/28/16 1124  TROPONINI 0.11* 0.04* 0.04*     Recent Results (from the past 240 hour(s))  Urine culture     Status: None   Collection Time: 07/27/16  7:47 AM  Result Value Ref Range Status  Specimen Description URINE, RANDOM  Final   Special Requests NONE  Final   Culture NO GROWTH Performed at [facility]   Final   Report Status 07/28/2016 FINAL  Final  Blood Culture (routine x 2)     Status: None (Preliminary result)   Collection Time: 07/27/16  7:50 AM  Result Value Ref Range Status   Specimen Description BLOOD LEFT ARM  Final   Special Requests BOTTLES DRAWN AEROBIC AND ANAEROBIC 9ML, BAE5ML  Final   Culture NO GROWTH 3 DAYS  Final   Report Status PENDING  Incomplete  Blood Culture (routine x 2)     Status: None (Preliminary result)   Collection Time: 07/27/16  7:54 AM  Result Value Ref Range Status   Specimen Description BLOOD RIGHT WRIST  Final   Special Requests BOTTLES DRAWN AEROBIC AND ANAEROBIC 5ML,BAE 4ML  Final   Culture NO GROWTH  3 DAYS  Final   Report Status PENDING  Incomplete     Studies: Ct Head Wo Contrast  Result Date: 07/28/2016 CLINICAL DATA:  history of hypertension, arthritis, Pancreatic mass status post percutaneous biliary drain placed on 07/18/2016 who presents for evaluation of generalized weakness and altered mental status. Wife apparently called EMS because the patient has been increasingly weak over the past 2 days. EXAM: CT HEAD WITHOUT CONTRAST TECHNIQUE: Contiguous axial images were obtained from the base of the skull through the vertex without intravenous contrast. COMPARISON:  None. FINDINGS: Brain: The ventricles are normal in configuration. There is ventricular and sulcal enlargement reflecting mild atrophy. There are no parenchymal masses or mass effect. There is no evidence of an infarct. Mild periventricular white matter hypoattenuation is noted consistent with chronic microvascular ischemic change. There are no extra-axial masses or abnormal fluid collections. There is no intracranial hemorrhage. Vascular: No hyperdense vessel or unexpected calcification. Skull: No skull lesion. Sinuses/Orbits: Visualize globes and orbits are unremarkable. Visualized sinuses are clear. Other: Clear mastoid air cells IMPRESSION: 1. No acute intracranial abnormalities. 2. Mild atrophy and chronic microvascular ischemic change. Electronically Signed   By: Lajean Manes M.D.   On: 07/28/2016 16:03    Scheduled Meds: . bisacodyl  10 mg Rectal Daily  . feeding supplement (ENSURE ENLIVE)  237 mL Oral TID BM  . heparin  5,000 Units Subcutaneous Q8H  . pantoprazole  40 mg Oral Daily    Assessment/Plan:  1. Acute encephalopathy likely secondary to dehydration and hypercalcemia. Mental status little bit better today than yesterday but still not back to baseline. 2. Acute kidney injury. Creatinine improved with IV fluid hydration. 3. Stage IV metastatic pancreatic cancer. Patient is a DO NOT RESUSCITATE. Family decided  home with hospice would be the route to go. I will have care manager set up hospice at home in a hospital bed. Hopefully this can be set up for disposition tomorrow. 4. Leakage around the biliary tube. I spoke with interventional radiology about this and they will look into things on whether or not something can be done about that. Since the tube was recently placed there may not be able to do anything about it. He will look into this and get back to me. 5. Severe malnutrition 6. Essential hypertension. Holding blood pressure medications.  Code Status:     Code Status Orders        Start     Ordered   07/27/16 [id number]  Do not attempt resuscitation (DNR)  Continuous    Question Answer Comment  In the event of  cardiac or respiratory ARREST Do not call a "code blue"   In the event of cardiac or respiratory ARREST Do not perform Intubation, CPR, defibrillation or ACLS   In the event of cardiac or respiratory ARREST Use medication by any route, position, wound care, and other measures to relive pain and suffering. May use oxygen, suction and manual treatment of airway obstruction as needed for comfort.      07/27/16 1245    Code Status History    Date Active Date Inactive Code Status Order ID Comments User Context   07/27/2016 11:52 AM 07/27/2016 12:45 PM DNR [id number]  Knox Royalty, NP ED   07/15/2016 12:27 PM 07/20/2016  1:18 PM Full Code [id number]  Charlynne Cousins, MD Inpatient   07/13/2016  9:45 PM 07/15/2016 11:35 AM Full Code [id number]  Gladstone Lighter, MD ED   07/23/2013 11:34 AM 07/24/2013  2:18 PM Full Code [id number]  Pricilla Loveless, PA-C Inpatient     Family Communication: Wife at the bedside Disposition Plan: Potentially home tomorrow with hospice  Consultants:  Oncology  Time spent: 35 minutes  Loletha Grayer  [employer]

## 2016-07-30 NOTE — Care Management (Signed)
Francisco Charles has a pancreatic mass which requires the upper body (chest and head) to be positioned in ways not feasible with a normal bed. The head must be elevate 30 degrees or more or Francisco Charles will experience undue pain and respiratory difficulty.  Shelbie Ammons RN MSN CCM Care Management [phone]

## 2016-07-30 NOTE — Progress Notes (Addendum)
[facility] referral for [facility] services at home following discharge received from [facility]. Francisco Charles is a 65 year old man admitted to [facility] on 8/29 for treatment and evaluation of weakness and altered mental status. He has a known history of a pancreatic mass with weight loss, confusion and biliary drain placement. On admission he was treated for dehydration and hypercalcemia with improved creatine, he has not improved in his confusion or weakness. Oncologist Dr. Grayland Ormond spoke with the family yesterday and there are no treatment options at this time. Family spoke again today with attending physician Dr. Leslye Peer and they have chosen to take [name] home with hospice services.  Writer met in the family room with patient's wife Francisco Charles, son Francisco Charles and his girl friend Francisco Charles to initiate education regarding hospice services with good understanding voiced. Patient will require a hospital bed and EMS transport at discharge.Hospital care team aware.  Patient seen lying in bed, difficult to rouse. Transport staff in to transport patient to specials for biliary drain replacement. Family left to get her home ready for the delivery of the bed. Patient information faxed to hospice referral. DNR to accompany patient at discharge. Please notify hospice at time of discharge ([phone]). Thank you. Flo Shanks RN, BSN, [place] [facility] [place], [facility] [phone] c

## 2016-07-31 MED ORDER — MORPHINE SULFATE (CONCENTRATE) 10 MG/0.5ML PO SOLN: 10.0000 mg | ORAL | 0 refills | Status: AC | PRN

## 2016-07-31 MED ORDER — MORPHINE SULFATE (CONCENTRATE) 10 MG/0.5ML PO SOLN: 10.0000 mg | ORAL | Status: DC | PRN

## 2016-07-31 NOTE — Discharge Instructions (Signed)
Flush drain with NS 10ml 3 times a day.  Empty drainage bag 4 times a day or more depending on how full it is.   Hypercalcemia Hypercalcemia is having too much calcium in the blood. The body needs calcium to make bones and keep them strong. Calcium also helps the muscles, nerves, brain, and heart work the way they should. Most of the calcium in the body is in the bones. There is also some calcium in the blood. Hypercalcemia can happen when calcium comes out of the bones, or when the kidneys are not able to remove calcium from the blood. Hypercalcemia can be mild or severe. CAUSES There are many possible causes of hypercalcemia. Common causes include:  Hyperparathyroidism. This is a condition in which the body produces too much parathyroid hormone. There are four parathyroid glands in your neck. These glands produce a chemical messenger (hormone) that helps the body absorb calcium from foods and helps your bones release calcium.  Certain kinds of cancer, such as lung cancer, breast cancer, or myeloma. Less common causes of hypercalcemia include:   Getting too much calcium or vitamin D from your diet.  Kidney failure.  Hyperthyroidism.  Being on bed rest for a long time.  Certain medicines.  Infections.  Sarcoidosis. RISK FACTORS This condition is more likely to develop in:  Women.  People who are 60 years or older.  People who have a family history of hypercalcemia. SYMPTOMS  Mild hypercalcemia that starts slowly may not cause symptoms. Severe, sudden hypercalcemia is more likely to cause symptoms, such as:  Loss of appetite.  Increased thirst and frequent urination.  Fatigue.  Nausea and vomiting.  Headache.  Abdominal pain.  Muscle pain, twitching, or weakness.  Constipation.  Blood in the urine.  Pain in the side of the back (flank pain).  Anxiety, confusion, or depression.  Irregular heartbeat (arrhythmia).  Loss of consciousness. DIAGNOSIS  This  condition may be diagnosed based on:   Your symptoms.  Blood tests.  Urine tests.  X-rays.  Ultrasound.  MRI.  CT scan. TREATMENT  Treatment for hypercalcemia depends on the cause. Treatment may include:  Receiving fluids through an IV tube.  Medicines that keep calcium levels steady after receiving fluids (loop diuretics).  Medicines that keep calcium in your bones (bisphosphonates).  Medicines that lower the calcium level in your blood.  Surgery to remove overactive parathyroid glands. HOME CARE INSTRUCTIONS  Take over-the-counter and prescription medicines only as told by your health care provider.  Follow instructions from your health care provider about eating or drinking restrictions.  Drink enough fluid to keep your urine clear or pale yellow.  Stay active. Weight-bearing exercise helps to keep calcium in your bones. Follow instructions from your health care provider about what type and level of exercise is safe for you.  Keep all follow-up visits as told by your health care provider. This is important. SEEK MEDICAL CARE IF:  You have a fever.  You have flank or abdominal pain that is getting worse. SEEK IMMEDIATE MEDICAL CARE IF:   You have severe abdominal or flank pain.  You have chest pain.  You have trouble breathing.  You become very confused and sleepy.  You lose consciousness.   This information is not intended to replace advice given to you by your health care provider. Make sure you discuss any questions you have with your health care provider.   Document Released: 01/29/2005 Document Revised: 08/06/2015 Document Reviewed: 04/02/2015 Elsevier Interactive Patient Education 2016  Elsevier Inc. ° °

## 2016-07-31 NOTE — Care Management Note (Deleted)
Case Management Note  Patient Details  Name: Francisco Charles MRN: [id number] Date of Birth: 04-17-1951  Subjective/Objective:                    Action/Plan:   Expected Discharge Date:                  Expected Discharge Plan:     In-House Referral:     Discharge planning Services     Post Acute Care Choice:    Choice offered to:     DME Arranged:    DME Agency:     HH Arranged:    HH Agency:     Status of Service:     If discussed at [employer] of Stay Meetings, dates discussed:    Additional Comments:  Ammi Hutt A, RN 07/31/2016, 9:35 AM

## 2016-07-31 NOTE — Discharge Summary (Signed)
[facility] NAME: Francisco Charles    MR#:  [id number]  DATE OF BIRTH:  1951/06/02  DATE OF ADMISSION:  07/27/2016 ADMITTING PHYSICIAN: Vaughan Basta, MD  DATE OF DISCHARGE: 07/31/2016  PRIMARY CARE PHYSICIAN: Lelon Huh, MD    ADMISSION DIAGNOSIS:  Troponin level elevated [R79.89] Sepsis, due to unspecified organism ([place]) [A41.9] Altered mental status, unspecified altered mental status type [R41.82]  DISCHARGE DIAGNOSIS:  Principal Problem:   Altered mental status Active Problems:   Dehydration   Pancreatic cancer metastasized to liver [facility])   DNR (do not resuscitate)   Palliative care by specialist   Hypercalcemia   SECONDARY DIAGNOSIS:   Past Medical History:  Diagnosis Date  . Cancer [facility])    pancreatic cancer- Aug 2017  . Carpal tunnel syndrome   . History of measles   . Hyperplastic colonic polyp 2006   Excised Dr. Tiffany Kocher  . Hypertension   . Osteoarthrosis   . Prostate nodule     HOSPITAL COURSE:   1. Acute encephalopathy likely secondary to dehydration and hypercalcemia. Mental status improved. He sleeps very deeply and needs to be startled in order to wake up. 2. Acute kidney injury. Creatinine improved with IV fluid hydration. 3. Stage IV metastatic pancreatic cancer. Patient is a DO NOT RESUSCITATE. Family decided to go home with hospice. Hospital bed to be delivered prior to disposition today. Seen in consultation by Dr. Grayland Ormond oncology and the decision not to pursue any further workup or treatments was made. 4. Biliary tube leakage improved with interventional radiology changing the tube out for a larger bore tube. 5. Severe malnutrition 6. Essential hypertension holding blood pressure medications 7. Borderline elevated troponin demand ischemia from acute kidney injury.  Patient going home with hospice care. Overall prognosis is poor.  DISCHARGE CONDITIONS:   Guarded  CONSULTS  OBTAINED:  Treatment Team:  Lloyd Huger, MD  DRUG ALLERGIES:  No Known Allergies  DISCHARGE MEDICATIONS:   Current Discharge Medication List    START taking these medications   Details  Morphine Sulfate (MORPHINE CONCENTRATE) 10 MG/0.5ML SOLN concentrated solution Take 0.5 mLs (10 mg total) by mouth every 2 (two) hours as needed for severe pain or shortness of breath. Qty: 30 mL, Refills: 0      CONTINUE these medications which have NOT CHANGED   Details  feeding supplement (BOOST / RESOURCE BREEZE) LIQD Take 1 Container by mouth 3 (three) times daily between meals. Qty: 30 Container, Refills: 0    methocarbamol (ROBAXIN) 750 MG tablet 1-2 TABLETS UP TO 4 TIMES/DAY IF NEEDED FOR MUSCLE PAIN AND SPASM Refills: 0   Associated Diagnoses: Pancreatic mass    omeprazole (PRILOSEC) 20 MG capsule Take 20 mg by mouth 2 (two) times daily.     traMADol (ULTRAM) 50 MG tablet Take 2 tablets (100 mg total) by mouth every 6 (six) hours as needed for moderate pain. Qty: 30 tablet, Refills: 0      STOP taking these medications     aspirin EC 81 MG tablet      quinapril-hydrochlorothiazide (ACCURETIC) 20-12.5 MG tablet          DISCHARGE INSTRUCTIONS:    Follow-up PMD one week  If you experience worsening of your admission symptoms, develop shortness of breath, life threatening emergency, suicidal or homicidal thoughts you must seek medical attention immediately by calling 911 or calling your MD immediately  if symptoms less severe.  You Must read complete instructions/literature along  with all the possible adverse reactions/side effects for all the Medicines you take and that have been prescribed to you. Take any new Medicines after you have completely understood and accept all the possible adverse reactions/side effects.   Please note  You were cared for by a hospitalist during your hospital stay. If you have any questions about your discharge medications or the care you  received while you were in the hospital after you are discharged, you can call the unit and asked to speak with the hospitalist on call if the hospitalist that took care of you is not available. Once you are discharged, your primary care physician will handle any further medical issues. Please note that NO REFILLS for any discharge medications will be authorized once you are discharged, as it is imperative that you return to your primary care physician (or establish a relationship with a primary care physician if you do not have one) for your aftercare needs so that they can reassess your need for medications and monitor your lab values.    Today   CHIEF COMPLAINT:   Chief Complaint  Patient presents with  . Altered Mental Status    HISTORY OF PRESENT ILLNESS:  Francisco Charles  is a 65 y.o. male presented with altered mental status   VITAL SIGNS:  Blood pressure (!) 146/88, pulse 89, temperature 98.6 F (37 C), temperature source Oral, resp. rate 17, height 6' (1.829 m), weight 68.7 kg (151 lb 8 oz), SpO2 98 %.    PHYSICAL EXAMINATION:  GENERAL:  65 y.o.-year-old patient lying in the bed with no acute distress.  EYES: Pupils equal, round, reactive to light and accommodation. No scleral icterus. Extraocular muscles intact.  HEENT: Head atraumatic, normocephalic. Oropharynx and nasopharynx clear.  NECK:  Supple, no jugular venous distention. No thyroid enlargement, no tenderness.  LUNGS: Normal breath sounds bilaterally, no wheezing, rales,rhonchi or crepitation. No use of accessory muscles of respiration.  CARDIOVASCULAR: S1, S2 normal. No murmurs, rubs, or gallops.  ABDOMEN: Soft, Slight right upper quadrant tenderness, non-distended. Bowel sounds present. No organomegaly or mass.  EXTREMITIES: No pedal edema, cyanosis, or clubbing.  NEUROLOGIC: Cranial nerves II through XII are intact.  PSYCHIATRIC: The patient is alert.  SKIN: No obvious rash, lesion, or ulcer.   DATA REVIEW:    CBC  Recent Labs Lab 07/29/16 0422  WBC 14.7*  HGB 15.9  HCT 45.8  PLT 250    Chemistries   Recent Labs Lab 07/27/16 0754  07/29/16 0422  NA 132*  < > 136  K 4.2  < > 3.9  CL 90*  < > 101  CO2 28  < > 28  GLUCOSE 167*  < > 105*  BUN 44*  < > 22*  CREATININE 1.38*  < > 0.54*  CALCIUM 12.4*  < > 9.5  AST 90*  --   --   ALT 171*  --   --   ALKPHOS 704*  --   --   BILITOT 4.7*  --   --   < > = values in this interval not displayed.  Cardiac Enzymes  Recent Labs Lab 07/28/16 1124  TROPONINI 0.04*    Microbiology Results  Results for orders placed or performed during the hospital encounter of 07/27/16  Urine culture     Status: None   Collection Time: 07/27/16  7:47 AM  Result Value Ref Range Status   Specimen Description URINE, RANDOM  Final   Special Requests NONE  Final  Culture NO GROWTH Performed at [facility]   Final   Report Status 07/28/2016 FINAL  Final  Blood Culture (routine x 2)     Status: None (Preliminary result)   Collection Time: 07/27/16  7:50 AM  Result Value Ref Range Status   Specimen Description BLOOD LEFT ARM  Final   Special Requests BOTTLES DRAWN AEROBIC AND ANAEROBIC 9ML, BAE5ML  Final   Culture NO GROWTH 4 DAYS  Final   Report Status PENDING  Incomplete  Blood Culture (routine x 2)     Status: None (Preliminary result)   Collection Time: 07/27/16  7:54 AM  Result Value Ref Range Status   Specimen Description BLOOD RIGHT WRIST  Final   Special Requests BOTTLES DRAWN AEROBIC AND ANAEROBIC 5ML,BAE 4ML  Final   Culture NO GROWTH 4 DAYS  Final   Report Status PENDING  Incomplete    RADIOLOGY:  Ir Catheter Tube Change  Result Date: 07/30/2016 INDICATION: History of pancreatic cancer post right-sided trans hepatic percutaneous biliary drainage catheter placement performed on 07/17/2016. Patient now experiencing leakage around the percutaneous biliary drainage catheter and as such, request made for percutaneous  cholangiogram and potential biliary drainage catheter exchange EXAM: FLUOROSCOPIC GUIDED RIGHT-SIDED PERCUTANEOUS BILIARY DRAINAGE CATHETER EXCHANGE AND UPSIZING COMPARISON:  COMPARISON Ultrasound fluoroscopic guided biliary drainage catheter placement - 07/17/2016; CT the abdomen pelvis - 07/27/2016 CONTRAST:  10 cc Isovue 300 administered into the biliary system FLUOROSCOPY TIME:  1 minute 12 seconds (12 mGy) COMPLICATIONS: None immediate. TECHNIQUE: Informed written consent was obtained from the patient's wife after a discussion of the risks, benefits and alternatives to treatment. Questions regarding the procedure were encouraged and answered. A timeout was performed prior to the initiation of the procedure. The external portion of the existing right-sided percutaneous biliary drainage catheter as well as the surrounding skin were prepped and draped in the usual sterile fashion. A sterile drape was applied covering the operative field. Maximum barrier sterile technique with sterile gowns and gloves were used for the procedure. A timeout was performed prior to the initiation of the procedure. A pre procedural spot fluoroscopic image was obtained after contrast was injected via the existing biliary drainage catheter. The existing biliary drainage catheter was cut and cannulated with a stiff Glidewire wire which was coiled within the proximal small bowel. Under intermittent fluoroscopic guidance, the existing PBD was exchanged for a new slightly larger 12 [place] PBD with radiopaque marker positioned slightly peripherally to allow for an increased number of sideholes within the biliary tree. Small amount of contrast was injected via the exchanged biliary drainage catheter and a post exchange spot fluoroscopic image was obtained. The catheter was locked and secured to the skin with a single interrupted suture. The biliary drainage catheter was capped. A dressing was placed. The patient tolerated the procedure well  without immediate postprocedural complication. FINDINGS: The existing percutaneous biliary catheter is appropriately positioned and functioning. After successful fluoroscopic guided exchange, the new 12 French percutaneous biliary catheter is appropriately positioned with end coiled and locked within the proximal duodenum. Note, the radiopaque marker was positioned slightly laterally to allow for an increase number of sideholes within the biliary tree in hopes of decreasing the chances of pericatheter biliary drainage. IMPRESSION: Successful fluoroscopic guided exchange and up sizing of now 12 French percutaneous biliary drainage catheter. PLAN: - recommend flushing the percutaneous biliary drainage catheters 3 times a day with 10 cc of saline. - as patient is being transferred to hospice care, biliary drainage catheter  exchange may be performed if and when indicated, though typically occurs on q 8 week basis. Electronically Signed   By: Sandi Mariscal M.D.   On: 07/30/2016 17:21    Management plans discussed with the patient, family and they are in agreement.  CODE STATUS:     Code Status Orders        Start     Ordered   07/27/16 1246  Do not attempt resuscitation (DNR)  Continuous    Question Answer Comment  In the event of cardiac or respiratory ARREST Do not call a "code blue"   In the event of cardiac or respiratory ARREST Do not perform Intubation, CPR, defibrillation or ACLS   In the event of cardiac or respiratory ARREST Use medication by any route, position, wound care, and other measures to relive pain and suffering. May use oxygen, suction and manual treatment of airway obstruction as needed for comfort.      07/27/16 1245    Code Status History    Date Active Date Inactive Code Status Order ID Comments User Context   07/27/2016 11:52 AM 07/27/2016 12:45 PM DNR [id number]  Knox Royalty, NP ED   07/15/2016 12:27 PM 07/20/2016  1:18 PM Full Code [id number]  Charlynne Cousins, MD Inpatient    07/13/2016  9:45 PM 07/15/2016 11:35 AM Full Code [id number]  Gladstone Lighter, MD ED   07/23/2013 11:34 AM 07/24/2013  2:18 PM Full Code [id number]  Lucille Passy Babish, PA-C Inpatient      TOTAL TIME TAKING CARE OF THIS PATIENT: 35 minutes.    Loletha Grayer M.D on 07/31/2016 at 1:50 PM  Between 7am to 6pm - Pager - [phone]  After 6pm go to [email or web address] - password [email or web address]  [facility] Office  [phone]  CC: Primary care physician; Lelon Huh, MD

## 2016-07-31 NOTE — Progress Notes (Signed)
Patient discharged home with hospice. All discharge instructions given and all questions answered. EMS called for transport. 

## 2016-07-31 NOTE — Care Management Note (Addendum)
Case Management Note  Patient Details  Name: Francisco Charles MRN: [id number] Date of Birth: 1950/12/16  Subjective/Objective:      [facility] reports that they have been unable to get in touch with [name] about delivering the hospital bed to the [place] home. This Probation officer gave them the wife's cell number toi call to arrange delivery of the bed today. [facility] stated that delivery of the bed would not happen until sometime in the afternoon.   Ivin Booty, on call nurse with [facility], is aware of [name]' discharge home today with [facility] to follow in the home. Hospice will visit [name] tomorrow at home.               Action/Plan:   Expected Discharge Date:                  Expected Discharge Plan:     In-House Referral:     Discharge planning Services     Post Acute Care Choice:    Choice offered to:     DME Arranged:    DME Agency:     HH Arranged:    HH Agency:     Status of Service:     If discussed at [employer] of Stay Meetings, dates discussed:    Additional Comments:  Briani Maul A, RN 07/31/2016, 9:39 AM

## 2016-07-31 NOTE — Care Management Note (Signed)
Case Management Note  Patient Details  Name: BRETTLEY NIEVES MRN: [id number] Date of Birth: 05-09-1951  Subjective/Objective:    Wife reports that a hospital bed from [place] is to be delivered to the home today before noon. This Probation officer is currently awaiting a call back from [facility] and from [facility].                 Action/Plan:   Expected Discharge Date:                  Expected Discharge Plan:     In-House Referral:     Discharge planning Services     Post Acute Care Choice:    Choice offered to:     DME Arranged:    DME Agency:     HH Arranged:    HH Agency:     Status of Service:     If discussed at [employer] of Stay Meetings, dates discussed:    Additional Comments:  Debora Stockdale A, RN 07/31/2016, 9:12 AM

## 2016-08-01 LAB — CULTURE, BLOOD (ROUTINE X 2)
CULTURE: NO GROWTH
Culture: NO GROWTH

## 2016-08-11 ENCOUNTER — Inpatient Hospital Stay: Payer: Self-pay | Admitting: Family Medicine

## 2016-08-29 DEATH — deceased

## 2017-03-18 IMAGING — XA IR CATHETER TUBE CHANGE
4 series · 7 of 7 positions shown · non-contrast
Comparison: COMPARISON
Ultrasound fluoroscopic guided biliary drainage catheter placement -
07/17/2016;

INDICATION: History of pancreatic cancer post right-sided trans hepatic
percutaneous biliary drainage catheter placement performed on
07/17/2016. Patient now experiencing leakage around the percutaneous
biliary drainage catheter and as such, request made for percutaneous
cholangiogram and potential biliary drainage catheter exchange

EXAM:
FLUOROSCOPIC GUIDED RIGHT-SIDED PERCUTANEOUS BILIARY DRAINAGE
CATHETER EXCHANGE AND UPSIZING
TECHNIQUE: Informed written consent was obtained from the patient's wife after
a discussion of the risks, benefits and alternatives to treatment.
Questions regarding the procedure were encouraged and answered. A
timeout was performed prior to the initiation of the procedure.

[Series 1: fl - angio · 2 of 2 slices shown (1 of 4)]
[im 1/2]
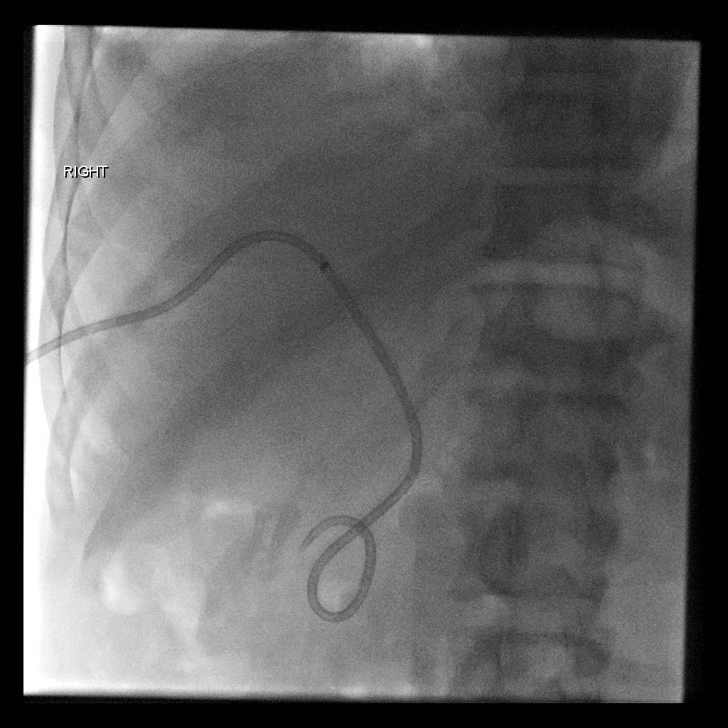
[im 2/2]
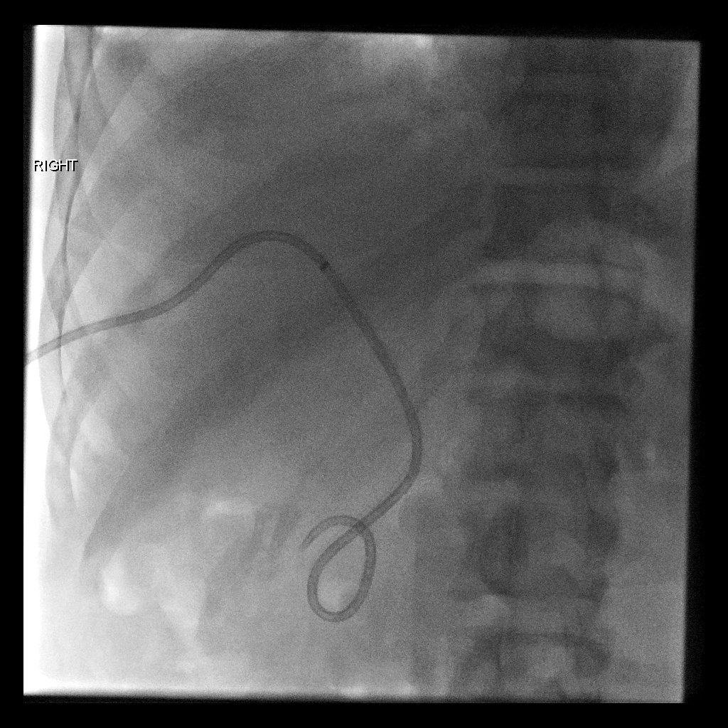

[Series 2: fl - angio · 1 of 1 slices shown (2 of 4)]
[im 1/1]
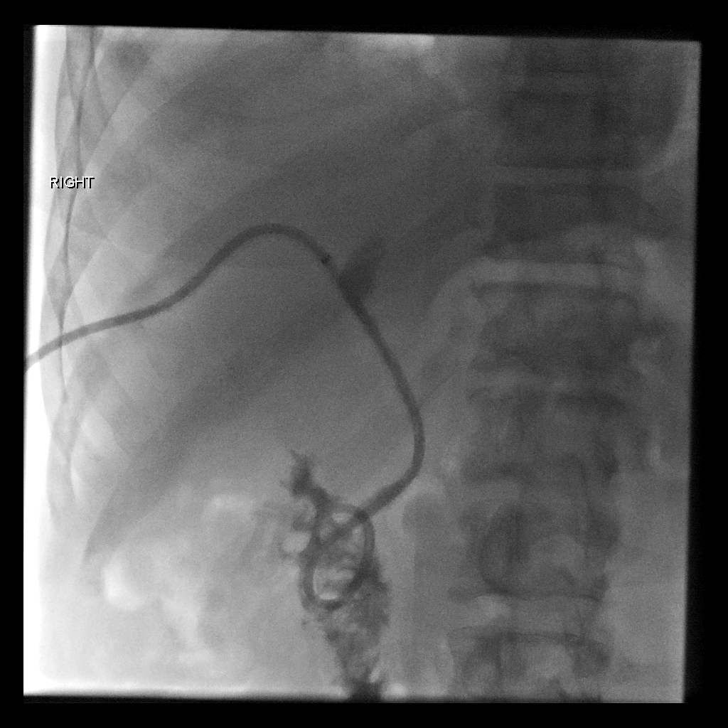

[Series 3: fl - angio · 2 of 2 slices shown (3 of 4)]
[im 1/2]
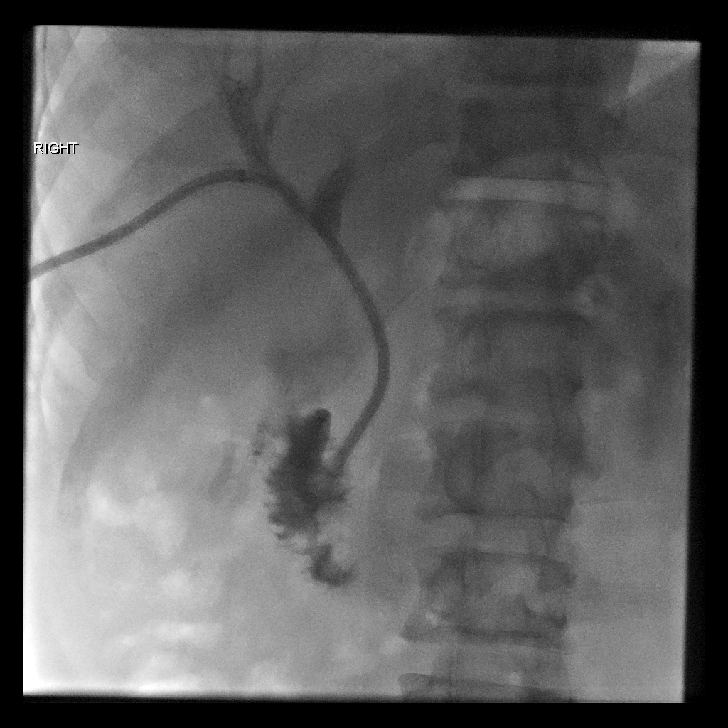
[im 2/2]
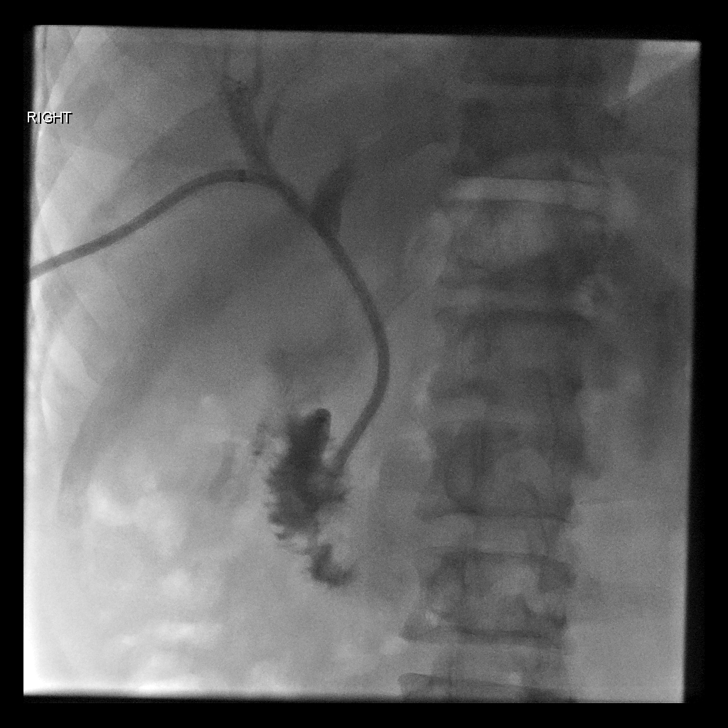

[Series 4: fl - angio · 2 of 2 slices shown (4 of 4)]
[im 1/2]
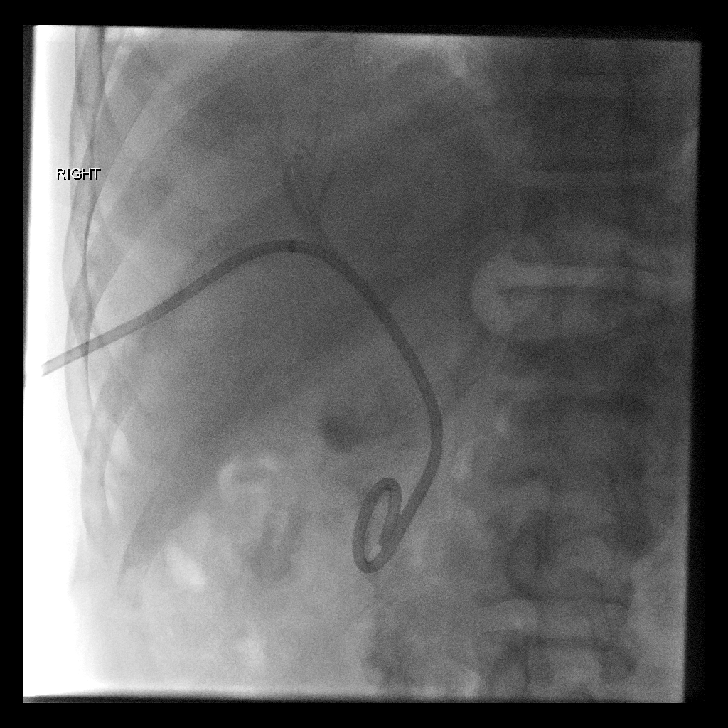
[im 2/2]
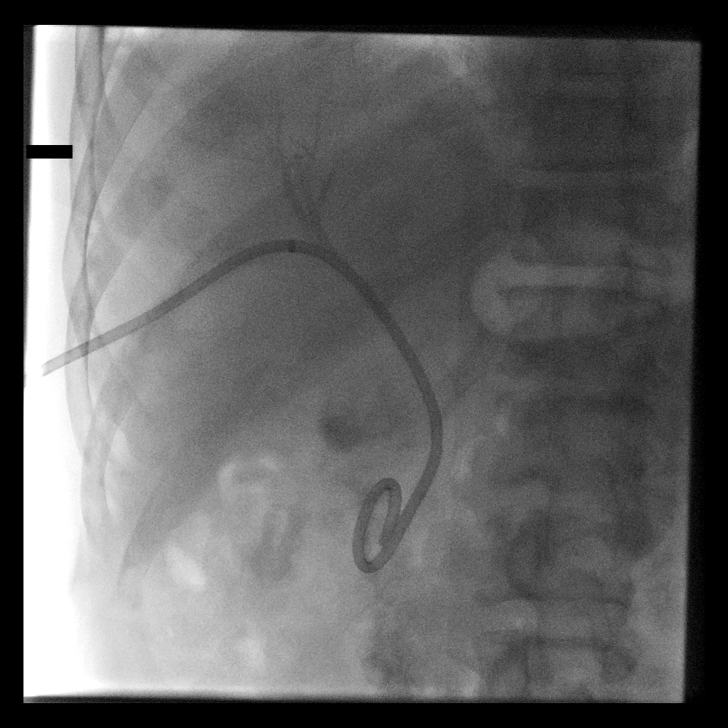

[7 of 7 positions shown; findings below may reference images not displayed]

CT the abdomen pelvis - 07/27/2016

CONTRAST:  10 cc Isovue 300 administered into the biliary system

FLUOROSCOPY TIME:  1 minute 12 seconds (12 mGy)

COMPLICATIONS:
None immediate.
The external portion of the existing right-sided percutaneous
biliary drainage catheter as well as the surrounding skin were
prepped and draped in the usual sterile fashion. A sterile drape was
applied covering the operative field. Maximum barrier sterile
technique with sterile gowns and gloves were used for the procedure.
A timeout was performed prior to the initiation of the procedure.

A pre procedural spot fluoroscopic image was obtained after contrast
was injected via the existing biliary drainage catheter. The
existing biliary drainage catheter was cut and cannulated with a
stiff Glidewire wire which was coiled within the proximal small
bowel. Under intermittent fluoroscopic guidance, the existing PBD
was exchanged for a new slightly larger 12 French PBD with
radiopaque marker positioned slightly peripherally to allow for an
increased number of sideholes within the biliary tree. Small amount
of contrast was injected via the exchanged biliary drainage catheter
and a post exchange spot fluoroscopic image was obtained. The
catheter was locked and secured to the skin with a single
interrupted suture. The biliary drainage catheter was capped. A
dressing was placed. The patient tolerated the procedure well
without immediate postprocedural complication.
FINDINGS: The existing percutaneous biliary catheter is appropriately
positioned and functioning.

After successful fluoroscopic guided exchange, the new 12 French
percutaneous biliary catheter is appropriately positioned with end
coiled and locked within the proximal duodenum. Note, the radiopaque
marker was positioned slightly laterally to allow for an increase
number of sideholes within the biliary tree in hopes of decreasing
the chances of pericatheter biliary drainage.
IMPRESSION: Successful fluoroscopic guided exchange and up sizing of now 12
French percutaneous biliary drainage catheter.

PLAN:
- recommend flushing the percutaneous biliary drainage catheters 3
times a day with 10 cc of saline.

- as patient is being transferred to hospice care, biliary drainage
catheter exchange may be performed if and when indicated, though
typically occurs on q 8 week basis.
